# Patient Record
Sex: Female | Born: 1995 | Hispanic: Yes | Marital: Married | Smoking: Never smoker
Health system: Southern US, Community
[De-identification: ages and names within clinical notes are randomized; demographics above are authoritative.]

## PROBLEM LIST (undated history)

## (undated) ENCOUNTER — Inpatient Hospital Stay (HOSPITAL_COMMUNITY): Payer: Self-pay

## (undated) DIAGNOSIS — Z8759 Personal history of other complications of pregnancy, childbirth and the puerperium: Secondary | ICD-10-CM

## (undated) DIAGNOSIS — G43009 Migraine without aura, not intractable, without status migrainosus: Secondary | ICD-10-CM

## (undated) DIAGNOSIS — F419 Anxiety disorder, unspecified: Secondary | ICD-10-CM

## (undated) DIAGNOSIS — J4520 Mild intermittent asthma, uncomplicated: Secondary | ICD-10-CM

## (undated) DIAGNOSIS — K219 Gastro-esophageal reflux disease without esophagitis: Secondary | ICD-10-CM

## (undated) DIAGNOSIS — Z789 Other specified health status: Secondary | ICD-10-CM

## (undated) DIAGNOSIS — F32A Depression, unspecified: Secondary | ICD-10-CM

## (undated) DIAGNOSIS — E1165 Type 2 diabetes mellitus with hyperglycemia: Secondary | ICD-10-CM

## (undated) DIAGNOSIS — R112 Nausea with vomiting, unspecified: Secondary | ICD-10-CM

## (undated) DIAGNOSIS — E782 Mixed hyperlipidemia: Secondary | ICD-10-CM

## (undated) DIAGNOSIS — D649 Anemia, unspecified: Secondary | ICD-10-CM

## (undated) DIAGNOSIS — Z8632 Personal history of gestational diabetes: Secondary | ICD-10-CM

## (undated) DIAGNOSIS — O24419 Gestational diabetes mellitus in pregnancy, unspecified control: Secondary | ICD-10-CM

## (undated) DIAGNOSIS — J45909 Unspecified asthma, uncomplicated: Secondary | ICD-10-CM

## (undated) DIAGNOSIS — Z862 Personal history of diseases of the blood and blood-forming organs and certain disorders involving the immune mechanism: Secondary | ICD-10-CM

## (undated) DIAGNOSIS — E119 Type 2 diabetes mellitus without complications: Secondary | ICD-10-CM

## (undated) DIAGNOSIS — Z973 Presence of spectacles and contact lenses: Secondary | ICD-10-CM

## (undated) DIAGNOSIS — Z794 Long term (current) use of insulin: Secondary | ICD-10-CM

## (undated) DIAGNOSIS — Z9889 Other specified postprocedural states: Secondary | ICD-10-CM

## (undated) DIAGNOSIS — E559 Vitamin D deficiency, unspecified: Secondary | ICD-10-CM

## (undated) DIAGNOSIS — O1490 Unspecified pre-eclampsia, unspecified trimester: Secondary | ICD-10-CM

## (undated) DIAGNOSIS — Z5189 Encounter for other specified aftercare: Secondary | ICD-10-CM

## (undated) HISTORY — PX: NO PAST SURGERIES: SHX2092

## (undated) HISTORY — PX: WISDOM TOOTH EXTRACTION: SHX21

## (undated) HISTORY — DX: Unspecified pre-eclampsia, unspecified trimester: O14.90

## (undated) HISTORY — DX: Encounter for other specified aftercare: Z51.89

## (undated) HISTORY — DX: Migraine without aura, not intractable, without status migrainosus: G43.009

## (undated) HISTORY — DX: Depression, unspecified: F32.A

## (undated) HISTORY — DX: Unspecified asthma, uncomplicated: J45.909

## (undated) HISTORY — DX: Anxiety disorder, unspecified: F41.9

---
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
## 2016-07-07 ENCOUNTER — Other Ambulatory Visit: Payer: Self-pay | Admitting: Obstetrics & Gynecology

## 2016-07-07 DIAGNOSIS — O3680X0 Pregnancy with inconclusive fetal viability, not applicable or unspecified: Secondary | ICD-10-CM

## 2016-07-09 ENCOUNTER — Other Ambulatory Visit: Payer: Self-pay

## 2016-07-15 ENCOUNTER — Ambulatory Visit (INDEPENDENT_AMBULATORY_CARE_PROVIDER_SITE_OTHER): Payer: Medicaid Other

## 2016-07-15 DIAGNOSIS — O4691 Antepartum hemorrhage, unspecified, first trimester: Secondary | ICD-10-CM

## 2016-07-15 DIAGNOSIS — Z3A09 9 weeks gestation of pregnancy: Secondary | ICD-10-CM

## 2016-07-15 DIAGNOSIS — O3680X0 Pregnancy with inconclusive fetal viability, not applicable or unspecified: Secondary | ICD-10-CM

## 2016-07-15 NOTE — Progress Notes (Addendum)
US 8+2 wks,single IUP w/ys,pos fht 171 bpm,normal ov's bilat,crl 18.7 mm,subchorionic hemorrhage 2.5 x .7 x 1.6 cm

## 2016-07-21 ENCOUNTER — Ambulatory Visit (INDEPENDENT_AMBULATORY_CARE_PROVIDER_SITE_OTHER): Payer: Medicaid Other | Admitting: Women's Health

## 2016-07-21 ENCOUNTER — Encounter: Payer: Self-pay | Admitting: Women's Health

## 2016-07-21 VITALS — BP 120/58 | HR 76 | Wt 224.0 lb

## 2016-07-21 DIAGNOSIS — O23591 Infection of other part of genital tract in pregnancy, first trimester: Secondary | ICD-10-CM | POA: Diagnosis not present

## 2016-07-21 DIAGNOSIS — N898 Other specified noninflammatory disorders of vagina: Secondary | ICD-10-CM

## 2016-07-21 DIAGNOSIS — Z331 Pregnant state, incidental: Secondary | ICD-10-CM | POA: Diagnosis not present

## 2016-07-21 DIAGNOSIS — O26891 Other specified pregnancy related conditions, first trimester: Secondary | ICD-10-CM | POA: Diagnosis not present

## 2016-07-21 DIAGNOSIS — R300 Dysuria: Secondary | ICD-10-CM | POA: Diagnosis not present

## 2016-07-21 DIAGNOSIS — Z3A09 9 weeks gestation of pregnancy: Secondary | ICD-10-CM | POA: Diagnosis not present

## 2016-07-21 LAB — POCT URINALYSIS DIPSTICK
Glucose, UA: NEGATIVE
KETONES UA: NEGATIVE
Leukocytes, UA: NEGATIVE
Nitrite, UA: NEGATIVE
PROTEIN UA: NEGATIVE
RBC UA: NEGATIVE

## 2016-07-21 LAB — POCT WET PREP (WET MOUNT)
Clue Cells Wet Prep Whiff POC: NEGATIVE
TRICHOMONAS WET PREP HPF POC: ABSENT

## 2016-07-21 NOTE — Progress Notes (Signed)
   [facility] Visit  Patient name: Claire Harrison MRN [id number]  Date of birth: 03/29/1996  CC & HPI:  Claire Harrison is a 20 y.o. G1P0 @ Hispanic female at 429wks presenting today for report of burning after she urinates, urinary frequency, water d/c x 2wks. New OB scheduled for 11/1 Patient's last menstrual period was 05/18/2016 (approximate).   Pertinent History Reviewed:  Medical & Surgical Hx:   Past medical, surgical, family, and social history reviewed in electronic medical record Medications: Reviewed & Updated - see associated section Allergies: Reviewed in electronic medical record  Objective Findings:  Vitals: BP (!) 120/58 (BP Location: Right Arm, Patient Position: Sitting, Cuff Size: Large)   Pulse 76   Wt 224 lb (101.6 kg)   LMP 05/18/2016 (Approximate)  There is no height or weight on file to calculate BMI.  Physical Examination: General appearance - alert, well appearing, and in no distress Pelvic - cx clear, scant amt normal appearing/smelling d/c  Results for orders placed or performed in visit on 07/21/16 (from the past 24 hour(s))  POCT urinalysis dipstick   Collection Time: 07/21/16  4:16 PM  Result Value Ref Range   Color, UA yellow    Clarity, UA clear    Glucose, UA neg    Bilirubin, UA     Ketones, UA neg    Spec Grav, UA     Blood, UA neg    pH, UA     Protein, UA neg    Urobilinogen, UA     Nitrite, UA neg    Leukocytes, UA Negative Negative  POCT Wet Prep [name]([place]et [place])   Collection Time: 07/21/16  4:34 PM  Result Value Ref Range   Source Wet Prep POC vaginal    WBC, Wet Prep HPF POC few    Bacteria Wet Prep HPF POC None None, Few, Too numerous to count   BACTERIA WET PREP MORPHOLOGY POC     Clue Cells Wet Prep HPF POC None None, Too numerous to count   Clue Cells Wet Prep Whiff POC Negative Whiff    Yeast Wet Prep HPF POC None    KOH Wet Prep POC     Trichomonas Wet Prep HPF POC Absent Absent     Assessment & Plan:  A:   20 [redacted]w[redacted]d  pregnant  Burning after urination  Urinary frequency  Urine dipstick completely neg  P:  Send urine cx, gc/ct  Return for As scheduled. 11/1 for new ob  Marge DuncansBooker, Aubra Pappalardo Randall CNM, [facility] 07/21/2016 4:35 PM

## 2016-07-23 LAB — GC/CHLAMYDIA PROBE AMP
CHLAMYDIA, DNA PROBE: NEGATIVE
NEISSERIA GONORRHOEAE BY PCR: NEGATIVE

## 2016-07-23 LAB — URINE CULTURE

## 2016-07-24 ENCOUNTER — Telehealth: Payer: Self-pay | Admitting: Women's Health

## 2016-07-24 DIAGNOSIS — O2341 Unspecified infection of urinary tract in pregnancy, first trimester: Secondary | ICD-10-CM | POA: Insufficient documentation

## 2016-07-24 MED ORDER — CEPHALEXIN 500 MG PO CAPS: 500.0000 mg | ORAL_CAPSULE | Freq: Four times a day (QID) | ORAL | 0 refills | Status: DC

## 2016-07-24 NOTE — Telephone Encounter (Signed)
Notified pt of +urine cx, rx keflex qid x 7d Cheral MarkerKimberly R. Yaziel Brandon, CNM, [facility] 07/24/2016 2:18 PM

## 2016-07-25 NOTE — Telephone Encounter (Signed)
See previous note for telephone encounter.  Cheral MarkerKimberly R. Booker, CNM, [facility] 07/25/2016 8:17 AM

## 2016-07-30 ENCOUNTER — Other Ambulatory Visit: Payer: Self-pay | Admitting: Adult Health

## 2016-07-30 ENCOUNTER — Telehealth: Payer: Self-pay | Admitting: Adult Health

## 2016-07-30 ENCOUNTER — Ambulatory Visit (INDEPENDENT_AMBULATORY_CARE_PROVIDER_SITE_OTHER): Payer: Medicaid Other | Admitting: Adult Health

## 2016-07-30 ENCOUNTER — Encounter: Payer: Self-pay | Admitting: Adult Health

## 2016-07-30 ENCOUNTER — Other Ambulatory Visit (INDEPENDENT_AMBULATORY_CARE_PROVIDER_SITE_OTHER): Payer: Medicaid Other

## 2016-07-30 VITALS — BP 120/60 | HR 76 | Ht 62.0 in | Wt 223.0 lb

## 2016-07-30 DIAGNOSIS — F329 Major depressive disorder, single episode, unspecified: Secondary | ICD-10-CM

## 2016-07-30 DIAGNOSIS — O039 Complete or unspecified spontaneous abortion without complication: Secondary | ICD-10-CM

## 2016-07-30 DIAGNOSIS — O3680X0 Pregnancy with inconclusive fetal viability, not applicable or unspecified: Secondary | ICD-10-CM | POA: Diagnosis not present

## 2016-07-30 DIAGNOSIS — Z0283 Encounter for blood-alcohol and blood-drug test: Secondary | ICD-10-CM

## 2016-07-30 DIAGNOSIS — Z1389 Encounter for screening for other disorder: Secondary | ICD-10-CM | POA: Diagnosis not present

## 2016-07-30 DIAGNOSIS — Z331 Pregnant state, incidental: Secondary | ICD-10-CM

## 2016-07-30 DIAGNOSIS — Z3A09 9 weeks gestation of pregnancy: Secondary | ICD-10-CM | POA: Diagnosis not present

## 2016-07-30 DIAGNOSIS — Z3481 Encounter for supervision of other normal pregnancy, first trimester: Secondary | ICD-10-CM

## 2016-07-30 DIAGNOSIS — F32A Depression, unspecified: Secondary | ICD-10-CM

## 2016-07-30 LAB — POCT URINALYSIS DIPSTICK
Glucose, UA: NEGATIVE
KETONES UA: NEGATIVE
Leukocytes, UA: NEGATIVE
Nitrite, UA: NEGATIVE
PROTEIN UA: NEGATIVE

## 2016-07-30 NOTE — Telephone Encounter (Addendum)
Pt states she saw Cyril MourningJennifer Griffin, NP today for a MAB, after discussing with her family and husband has decided to proceed with D&C. Call transferred to [facility] Griffin,NP and appt scheduled with Dr. Emelda FearFerguson tomorrow to discuss procedure.

## 2016-07-30 NOTE — Addendum Note (Signed)
Addended by: Colen DarlingYOUNG, Jordanny Waddington S on: 07/30/2016 01:51 PM   Modules accepted: Orders

## 2016-07-30 NOTE — Patient Instructions (Signed)
Follow  Up in 1 week Aborto espontneo  (Miscarriage) El aborto espontneo es la prdida de un beb que no ha nacido (feto) antes de la semana 20 del Psychiatristembarazo. La mayor parte de estos abortos ocurre en los primeros 3 meses. En algunos casos ocurre antes de que la mujer sepa que est [place]. Tambin se denomina "aborto espontneo" o "prdida prematura del embarazo". El aborto espontneo puede ser Neomia Dearuna experiencia que afecte emocionalmente a Dealerla persona. Converse con su mdico si tiene dudas, cmo es el proceso de Ferndaleduelo, y sobre planes futuros de Psychiatristembarazo.  CAUSAS   Algunos problemas cromosmicos pueden hacer imposible que el beb se desarrolle normalmente. Los problemas con los genes o cromosomas del beb son generalmente el resultado de errores que se producen, por casualidad, cuando el embrin se divide y crece. Estos problemas no se heredan de los Midlandpadres.  Infeccin en el cuello del tero.   Problemas hormonales.   Problemas en el cuello del tero, como tener un tero incompetente. Esto ocurre cuando los tejidos no son lo suficientemente fuertes como para Arts administratorcontener el embarazo.   Problemas del tero, como un tero con forma anormal, los fibromas o anormalidades congnitas.   Ciertas enfermedades crnicas.   No fume, no beba alcohol, ni consuma drogas.   Traumatismos  A veces, la causa es desconocida.  SNTOMAS   Sangrado o manchado vaginal, con o sin clicos o dolor.  Dolor o clicos en el abdomen o en la cintura.  Eliminacin de lquido, tejidos o cogulos grandes por la vagina. DIAGNSTICO  El Office Depotmdico le har un examen fsico. Tambin le indicar una ecografa para confirmar el aborto. Es posible que se realicen anlisis de [place].  TRATAMIENTO   En algunos casos el tratamiento no es necesario, si se eliminan naturalmente todos los tejidos embrionarios que se encontraban en el tero. Si el feto o la placenta quedan dentro del tero (aborto incompleto), pueden infectarse,  los tejidos que quedan pueden infectarse y deben retirarse. Generalmente se realiza un procedimiento de dilatacin y curetaje (D y C). Durante el procedimiento de dilatacin y curetaje, el cuello del tero se abre (dilata) y se retira cualquier resto de tejido fetal o placentario del tero.  Si hay una infeccin, le recetarn antibiticos. Podrn recetarle otros medicamentos para reducir el tamao del tero (contraerlo) si hay una mucho sangrado.  Si su sangre es Rh negativa y su beb es Rh positivo, usted necesitar la inyeccin de inmunoglobulina Rh. Esta inyeccin proteger a los futuros bebs de tener problemas de compatibilidad Rh en futuros embarazos. INSTRUCCIONES PARA EL CUIDADO EN EL HOGAR   El mdico le indicar reposo en cama o le permitir Dance movement psychotherapistrealizar actividades livianas. Vuelva a la actividad lentamente o segn las indicaciones de su mdico.  Pdale a alguien que la ayude con las responsabilidades familiares y del hogar durante este tiempo.   Lleve un registro de la cantidad y la saturacin de las toallas higinicas que Landscape architectutiliza cada da. Anote esta informacin   No use tampones. No No se haga duchas vaginales ni tenga relaciones sexuales hasta que el mdico la autorice.   Slo tome medicamentos de venta libre o recetados para Primary school teachercalmar el dolor o Environmental health practitionerel malestar, segn las indicaciones de su mdico.   No tome aspirina. La aspirina puede ocasionar hemorragias.   Concurra puntualmente a las citas de control con el mdico.   Si usted o su pareja tienen dificultades con el duelo, hable con su mdico para buscar la ayuda psicolgica que  los ayude a Programmer, applicationsenfrentar la prdida del Psychiatristembarazo. Permtase el tiempo suficiente de duelo antes de quedar embarazada nuevamente.  SOLICITE ATENCIN MDICA DE INMEDIATO SI:   Siente calambres intensos o dolor en la espalda o en el abdomen.  Tiene fiebre.  Elimina grandes cogulos de [place]angre (del tamao de una nuez o ms) o tejidos por la vagina. Guarde lo  que ha eliminado para que su mdico lo examine.   La hemorragia aumenta.   Brett Fairybserva una secrecin vaginal espesa y con mal olor.  Se siente mareada, dbil, o se desmaya.   Siente escalofros.  ASEGRESE DE QUE:   Comprende estas instrucciones.  Controlar su enfermedad.  Solicitar ayuda de inmediato si no mejora o si empeora.   Esta informacin no tiene Theme park managercomo fin reemplazar el consejo del mdico. Asegrese de hacerle al mdico cualquier pregunta que tenga.   Document Released: 06/25/2005 Document Revised: 01/10/2013 Elsevier Interactive Patient Education [employer]2016 Elsevier Inc.

## 2016-07-30 NOTE — Progress Notes (Signed)
US TV 8+3 wk fetal pole with NO FHT,crl 20.1 mm,normal ov's bilat,pt was seen by Victorino DikeJennifer

## 2016-07-30 NOTE — Progress Notes (Addendum)
Subjective:     Patient ID: Claire Harrison, female   DOB: 06/24/1996, 20 y.o.   MRN: [id number]  HPI Claire Harrison is a 20 year old Hispanic female in for new OB, about 10 weeks, and found to have no FHM.She started spotting last night while trick or treating [facility] QHCG was 8573.0, she did not have US.  Review of Systems +spotting Reviewed past medical,surgical, social and family history. Reviewed medications and allergies.     Objective:   Physical Exam BP 120/60   Pulse 76   Wt 223 lb (101.2 kg)   LMP 05/18/2016 (Approximate)  Skin warm and dry. Neck: mid line trachea, normal thyroid, good ROM, no lymphadenopathy noted. Lungs: clear to ausculation bilaterally. Cardiovascular: regular rate and rhythm.Abdomen is soft and non tender, US showed no FHM and baby stopped growing at about 8+3 weeks.Discussed cytotec, D&C and waiting and watching, she is going to go home and discuss with family, then let me know. PHQ 9 score was 12, she is a little depressed, but she declines meds at present, she is teary  now.She says blood type B+.   Face time over 30 minutes counseling and coordinating care.  Assessment:     Miscarriage    Depression   Plan:     Go home and talk with husband and let me know in am what you want to do Follow up in 1 week Review handout on miscarriage

## 2016-07-31 ENCOUNTER — Encounter (HOSPITAL_COMMUNITY)
Admission: RE | Admit: 2016-07-31 | Discharge: 2016-07-31 | Disposition: A | Discharge status: Home / Self Care | Payer: Medicaid Other | Source: Ambulatory Visit | Attending: Obstetrics and Gynecology | Admitting: Obstetrics and Gynecology

## 2016-07-31 ENCOUNTER — Other Ambulatory Visit: Payer: Self-pay | Admitting: Obstetrics and Gynecology

## 2016-07-31 ENCOUNTER — Encounter (HOSPITAL_COMMUNITY): Payer: Self-pay

## 2016-07-31 ENCOUNTER — Encounter: Payer: Self-pay | Admitting: Obstetrics and Gynecology

## 2016-07-31 ENCOUNTER — Ambulatory Visit (INDEPENDENT_AMBULATORY_CARE_PROVIDER_SITE_OTHER): Payer: Medicaid Other | Admitting: Obstetrics and Gynecology

## 2016-07-31 VITALS — BP 118/72 | HR 74 | Ht 62.0 in | Wt 223.0 lb

## 2016-07-31 DIAGNOSIS — Z01818 Encounter for other preprocedural examination: Secondary | ICD-10-CM

## 2016-07-31 DIAGNOSIS — Z01812 Encounter for preprocedural laboratory examination: Secondary | ICD-10-CM | POA: Insufficient documentation

## 2016-07-31 DIAGNOSIS — Z0183 Encounter for blood typing: Secondary | ICD-10-CM | POA: Insufficient documentation

## 2016-07-31 DIAGNOSIS — O021 Missed abortion: Secondary | ICD-10-CM | POA: Diagnosis not present

## 2016-07-31 HISTORY — DX: Other specified health status: Z78.9

## 2016-07-31 LAB — CBC
HCT: 37.7 % (ref 36.0–46.0)
Hemoglobin: 12.8 g/dL (ref 12.0–15.0)
MCH: 27.8 pg (ref 26.0–34.0)
MCHC: 34 g/dL (ref 30.0–36.0)
MCV: 82 fL (ref 78.0–100.0)
PLATELETS: 360 10*3/uL (ref 150–400)
RBC: 4.6 MIL/uL (ref 3.87–5.11)
RDW: 13 % (ref 11.5–15.5)
WBC: 11.3 10*3/uL — ABNORMAL HIGH (ref 4.0–10.5)

## 2016-07-31 LAB — TYPE AND SCREEN
ABO/RH(D): B POS
Antibody Screen: NEGATIVE

## 2016-07-31 LAB — MICROSCOPIC EXAMINATION: Casts: NONE SEEN /lpf

## 2016-07-31 LAB — URINALYSIS, ROUTINE W REFLEX MICROSCOPIC
Bilirubin, UA: NEGATIVE
GLUCOSE, UA: NEGATIVE
Ketones, UA: NEGATIVE
Leukocytes, UA: NEGATIVE
Nitrite, UA: NEGATIVE
PROTEIN UA: NEGATIVE
Specific Gravity, UA: 1.013 (ref 1.005–1.030)
Urobilinogen, Ur: 0.2 mg/dL (ref 0.2–1.0)
pH, UA: 6 (ref 5.0–7.5)

## 2016-07-31 LAB — GC/CHLAMYDIA PROBE AMP
CHLAMYDIA, DNA PROBE: NEGATIVE
Neisseria gonorrhoeae by PCR: NEGATIVE

## 2016-07-31 NOTE — Addendum Note (Signed)
Addended by: Tilda BurrowFERGUSON, Victory Strollo V. on: 07/31/2016 04:38 PM   Modules accepted: Orders

## 2016-07-31 NOTE — Progress Notes (Signed)
Patient ID: Claire Harrison, female   DOB: 09/13/1996, 20 y.o.   MRN: [id number]  Preoperative History and Physical  Claire Harrison is a 20 y.o. G1P0 here for surgical management of miscarriage. Pt notes that she was evaluated at [facility] 2 nights ago and informed that there was no fetal heart movement. Pt reports that she followed up in office on yesterday with no decision made as to her next steps. Pt states that this was her first pregnancy. Pt reports that she is having associated symptoms of resolved vomiting x yesterday, nausea x yesterday, and vaginal spotting. Pt hasn't tried any medications for the relief of her symptoms. Pt denies any other symptoms.   No significant preoperative concerns.  Proposed surgery: D&C  History reviewed. No pertinent past medical history. History reviewed. No pertinent surgical history. OB History  Gravida Para Term Preterm AB Living  1            SAB TAB Ectopic Multiple Live Births               # Outcome Date GA Lbr Len/2nd Weight Sex Delivery Anes PTL Lv  1 Current             Patient denies any other pertinent gynecologic issues.   Current Outpatient Prescriptions on File Prior to Visit  Medication Sig Dispense Refill  . cephALEXin (KEFLEX) 500 MG capsule Take 1 capsule (500 mg total) by mouth 4 (four) times daily. X 7 days 28 capsule 0  . Prenatal Vit-Fe Fumarate-FA (PRENATAL VITAMIN PO) Take by mouth daily.      No current facility-administered medications on file prior to visit.    Allergies  Allergen Reactions  . Latex Rash    Social History:   reports that she has never smoked. She has never used smokeless tobacco. She reports that she does not drink alcohol or use drugs.  Family History  Problem Relation Age of Onset  . Diabetes Mother   . Hypertension Mother   . Kidney failure Mother   . Diabetes Sister   . Diabetes Sister   . Diabetes Sister     Review of Systems: Noncontributory  PHYSICAL EXAM: Blood pressure  118/72, pulse 74, height 5\' 2"  (1.575 m), weight 223 lb (101.2 kg), last menstrual period 05/18/2016. General appearance - alert, well appearing, and in no distress Chest - clear to auscultation, no wheezes, rales or rhonchi, symmetric air entry Heart - normal rate and regular rhythm Abdomen - soft, nontender, nondistended, no masses or organomegaly Pelvic - normal external genitalia, vulva, vagina, cervix, uterus and adnexa,  VULVA: normal appearing vulva with no masses, tenderness or lesions,  VAGINA: normal appearing vagina with normal color and discharge, no lesions, DNA probe for chlamydia and GC obtained,  CERVIX: normal appearing cervix without discharge or lesions,  UTERUS: uterus is normal size, shape, consistency and nontender, mid-position ADNEXA: normal adnexa in size, nontender and no masses Extremities - peripheral pulses normal, no pedal edema, no clubbing or cyanosis  Labs: Results for orders placed or performed in visit on 07/30/16 (from the past 336 hour(s))  POCT Urinalysis Dipstick   Collection Time: 07/30/16 11:00 AM  Result Value Ref Range   Color, UA     Clarity, UA     Glucose, UA neg    Bilirubin, UA     Ketones, UA neg    Spec Grav, UA     Blood, UA trace    pH, UA     Protein,  UA neg    Urobilinogen, UA     Nitrite, UA neg    Leukocytes, UA Negative Negative  Microscopic Examination   Collection Time: 07/30/16  1:00 PM  Result Value Ref Range   WBC, UA 0-5 0 - 5 /hpf   RBC, UA 0-2 0 - 2 /hpf   Epithelial Cells (non renal) 0-10 0 - 10 /hpf   Casts None seen None seen /lpf   Mucus, UA Present Not Estab.   Bacteria, UA Few None seen/Few  Urinalysis, Routine w reflex microscopic ([facility])   Collection Time: 07/30/16  1:00 PM  Result Value Ref Range   Specific Gravity, UA 1.013 1.005 - 1.030   pH, UA 6.0 5.0 - 7.5   Color, UA Yellow Yellow   Appearance Ur Clear Clear   Leukocytes, UA Negative Negative   Protein, UA Negative Negative/Trace    Glucose, UA Negative Negative   Ketones, UA Negative Negative   RBC, UA Trace (A) Negative   Bilirubin, UA Negative Negative   Urobilinogen, Ur 0.2 0.2 - 1.0 mg/dL   Nitrite, UA Negative Negative   Microscopic Examination See below:   Results for orders placed or performed in visit on 07/21/16 (from the past 336 hour(s))  POCT urinalysis dipstick   Collection Time: 07/21/16  4:16 PM  Result Value Ref Range   Color, UA yellow    Clarity, UA clear    Glucose, UA neg    Bilirubin, UA     Ketones, UA neg    Spec Grav, UA     Blood, UA neg    pH, UA     Protein, UA neg    Urobilinogen, UA     Nitrite, UA neg    Leukocytes, UA Negative Negative  POCT Wet Prep Claire Harrison(Wet [place])   Collection Time: 07/21/16  4:34 PM  Result Value Ref Range   Source Wet Prep POC vaginal    WBC, Wet Prep HPF POC few    Bacteria Wet Prep HPF POC None None, Few, Too numerous to count   BACTERIA WET PREP MORPHOLOGY POC     Clue Cells Wet Prep HPF POC None None, Too numerous to count   Clue Cells Wet Prep Whiff POC Negative Whiff    Yeast Wet Prep HPF POC None    KOH Wet Prep POC     Trichomonas Wet Prep HPF POC Absent Absent  GC/Chlamydia Probe Amp   Collection Time: 07/21/16  4:45 PM  Result Value Ref Range   Chlamydia trachomatis, NAA Negative Negative   Neisseria gonorrhoeae by PCR Negative Negative  Urine culture   Collection Time: 07/21/16  4:45 PM  Result Value Ref Range   Urine Culture, Routine Final report (A)    Urine Culture result 1 Escherichia coli (A)    ANTIMICROBIAL SUSCEPTIBILITY Comment     Imaging Studies: [place] Ob Comp Less 14 Wks  Result Date: 07/16/2016 DATING AND VIABILITY SONOGRAM Claire Harrison is a 20 y.o. year old G1P0 with LMP 05/18/2016 which would correlate to  2690w2d weeks gestation.  She has irregular menstrual cycles.   She is here today for a confirmatory initial sonogram. GESTATION: SINGLETON   FETAL ACTIVITY:          Heart rate         171        CERVIX: Appears closed  ADNEXA: The ovaries are normal. GESTATIONAL AGE AND  BIOMETRICS: Gestational criteria: Estimated Date of Delivery: 02/22/17 by LMP  now at 5125w2d Previous Scans:0    CROWN RUMP LENGTH           18.7 mm         8+2 weeks                                                                      AVERAGE EGA(BY THIS SCAN):  8+2 weeks WORKING EDD( LMP ):  02/22/2017  TECHNICIAN COMMENTS: US 8+2 wks,single IUP w/ys,pos fht 171 bpm,normal ov's bilat,crl 18.7 mm,subchorionic hemorrhage 2.5 x .7 x 1.6 cm A copy of this report including all images has been saved and backed up to a second source for retrieval if needed. All measures and details of the anatomical scan, placentation, fluid volume and pelvic anatomy are contained in that report. Amber Flora LippsJ Carl 07/15/2016 10:54 AM Clinical Impression and recommendations: I have reviewed the sonogram results above, combined with the patient's current clinical course, below are my impressions and any appropriate recommendations for management based on the sonographic findings. Viable early IUP G1P0 Estimated Date of Delivery: 02/22/17 LMP, today's sonogram Normal general sonographic findings Recommend routine care unless otherwise clinically indicated EURE,LUTHER H 07/16/2016 6:54 PM    Assessment: Missed AB  Patient Active Problem List   Diagnosis Date Noted  . UTI (urinary tract infection) during pregnancy, first trimester 07/24/2016  . Pregnant 07/21/2016     Plan: Patient will undergo surgical management with lab work completed today and D&C scheduled for 9:45 AM tomorrow.    Tilda BurrowJohn V Amiracle Neises, MD 07/31/2016 2:01 PM  By signing my name below, I, Soijett Blue, attest that this documentation has been prepared under the direction and in the presence of Tilda BurrowJohn V Othelia Riederer, MD. Electronically Signed: Soijett Blue, ED Scribe. 07/31/16. 2:01 PM.  I personally performed the services described in this documentation, which was SCRIBED in my presence. The recorded information has been  reviewed and considered accurate. It has been edited as necessary during review. Tilda BurrowFERGUSON,Shant Hence V, MD

## 2016-07-31 NOTE — Patient Instructions (Addendum)
Your procedure is scheduled on: 08/01/2016  [facility] at  [facility]:15   AM.  Call this number if you have problems the morning of surgery: [phone]   Remember:   Do not drink or eat food:After Midnight.  :     Do not wear jewelry, make-up or nail polish.  Do not wear lotions, powders, or perfumes. You may wear deodorant.  Do not shave 48 hours prior to surgery. Men may shave face and neck.  Do not bring valuables to the hospital.  Contacts, dentures or bridgework may not be worn into surgery.  Leave suitcase in the car. After surgery it may be brought to your room.  For patients admitted to the hospital, checkout time is 11:00 AM the day of discharge.   Patients discharged the day of surgery will not be allowed to drive home.    Special Instructions: Shower using CHG night before surgery and shower the day of surgery use CHG.  Use special wash - you have one bottle of CHG for all showers.  You should use approximately 1/2 of the bottle for each shower. Dilation and Curettage or Vacuum Curettage Dilation and curettage (D&C) and vacuum curettage are minor procedures. A D&C involves stretching (dilation) the cervix and scraping (curettage) the inside lining of the womb (uterus). During a D&C, tissue is gently scraped from the inside lining of the uterus. During a vacuum curettage, the lining and tissue in the uterus are removed with the use of gentle suction.  Curettage may be performed to either diagnose or treat a problem. As a diagnostic procedure, curettage is performed to examine tissues from the uterus. A diagnostic curettage may be performed for the following symptoms:   Irregular bleeding in the uterus.   Bleeding with the development of clots.   Spotting between menstrual periods.   Prolonged menstrual periods.   Bleeding after menopause.   No menstrual period (amenorrhea).   A change in size and shape of the uterus.  As a treatment procedure, curettage  may be performed for the following reasons:   Removal of an IUD (intrauterine device).   Removal of retained placenta after giving birth. Retained placenta can cause an infection or bleeding severe enough to require transfusions.   Abortion.   Miscarriage.   Removal of polyps inside the uterus.   Removal of uncommon types of noncancerous lumps (fibroids).  [facility] PROVID[facility] KNOW ABOUT:   Any allergies you have.   All medicines you are taking, including vitamins, herbs, eye drops, creams, and over-the-counter medicines.   Previous problems you or members of your family have had with the use of anesthetics.   Any blood disorders you have.   Previous surgeries you have had.   Medical conditions you have. RISKS AND COMPLICATIONS  Generally, this is a safe procedure. However, as with any procedure, complications can occur. Possible complications include:  Excessive bleeding.   Infection of the uterus.   Damage to the cervix.   Development of scar tissue (adhesions) inside the uterus, later causing abnormal amounts of menstrual bleeding.   Complications from the general anesthetic, if a general anesthetic is used.   Putting a hole (perforation) in the uterus. This is rare.  BEFORE THE PROCEDURE   Eat and drink before the procedure only as directed by your health care provider.   Arrange for someone to take you home.  PROCEDURE  This procedure usually takes about 15-30 minutes.  You will be given  one of the following:  A medicine that numbs the area in and around the cervix (local anesthetic).   A medicine to make you sleep through the procedure (general anesthetic).  You will lie on your back with your legs in stirrups.   A warm metal or plastic instrument (speculum) will be placed in your vagina to keep it open and to allow the health care provider to see the cervix.  There are two ways in which your cervix can be softened and  dilated. These include:   Taking a medicine.   Having thin rods (laminaria) inserted into your cervix.   A curved tool (curette) will be used to scrape cells from the inside lining of the uterus. In some cases, gentle suction is applied with the curette. The curette will then be removed.  AFTER THE PROCEDURE   You will rest in the recovery area until you are stable and are ready to go home.   You may feel sick to your stomach (nauseous) or throw up (vomit) if you were given a general anesthetic.   You may have a sore throat if a tube was placed in your throat during general anesthesia.   You may have light cramping and bleeding. This may last for 2 days to 2 weeks after the procedure.   Your uterus needs to make a new lining after the procedure. This may make your next period late.   This information is not intended to replace advice given to you by your health care provider. Make sure you discuss any questions you have with your health care provider.   Document Released: 09/15/2005 Document Revised: 05/18/2013 Document Reviewed: 04/14/2013 Elsevier Interactive Patient Education 2016 Elsevier Inc. General Anesthesia, Adult, Care After Refer to this sheet in the next few weeks. These instructions provide you with information on caring for yourself after your procedure. Your health care provider may also give you more specific instructions. Your treatment has been planned according to current medical practices, but problems sometimes occur. Call your health care provider if you have any problems or questions after your procedure. WHAT TO EXPECT AFTER THE PROCEDURE After the procedure, it is typical to experience:  Sleepiness.  Nausea and vomiting. HOME CARE INSTRUCTIONS  For the first 24 hours after general anesthesia:  Have a responsible person with you.  Do not drive a car. If you are alone, do not take public transportation.  Do not drink alcohol.  Do not take  medicine that has not been prescribed by your health care provider.  Do not sign important papers or make important decisions.  You may resume a normal diet and activities as directed by your health care provider.  Change bandages (dressings) as directed.  If you have questions or problems that seem related to general anesthesia, call the hospital and ask for the anesthetist or anesthesiologist on call. SEEK MEDICAL CARE IF:  You have nausea and vomiting that continue the day after anesthesia.  You develop a rash. SEEK IMMEDIATE MEDICAL CARE IF:   You have difficulty breathing.  You have chest pain.  You have any allergic problems.   This information is not intended to replace advice given to you by your health care provider. Make sure you discuss any questions you have with your health care provider.   Document Released: 12/22/2000 Document Revised: 10/06/2014 Document Reviewed: 01/14/2012 Elsevier Interactive Patient Education [employer]2016 Elsevier Inc.

## 2016-07-31 NOTE — Patient Instructions (Signed)
Incomplete Miscarriage A miscarriage is the sudden loss of an unborn baby (fetus) before the 20th week of pregnancy. In an incomplete miscarriage, parts of the fetus or placenta (afterbirth) remain in the body.  Having a miscarriage can be an emotional experience. Talk with your health care provider about any questions you may have about miscarrying, the grieving process, and your future pregnancy plans. CAUSES   Problems with the fetal chromosomes that make it impossible for the baby to develop normally. Problems with the baby's genes or chromosomes are most often the result of errors that occur by chance as the embryo divides and grows. The problems are not inherited from the parents.  Infection of the cervix or uterus.  Hormone problems.  Problems with the cervix, such as having an incompetent cervix. This is when the tissue in the cervix is not strong enough to hold the pregnancy.  Problems with the uterus, such as an abnormally shaped uterus, uterine fibroids, or congenital abnormalities.  Certain medical conditions.  Smoking, drinking alcohol, or taking illegal drugs.  Trauma. SYMPTOMS   Vaginal bleeding or spotting, with or without cramps or pain.  Pain or cramping in the abdomen or lower back.  Passing fluid, tissue, or blood clots from the vagina. DIAGNOSIS  Your health care provider will perform a physical exam. You may also have an ultrasound to confirm the miscarriage. Blood or urine tests may also be ordered. TREATMENT   Usually, a dilation and curettage (D&C) procedure is performed. During a D&C procedure, the cervix is widened (dilated) and any remaining fetal or placental tissue is gently removed from the uterus.  Antibiotic medicines are prescribed if there is an infection. Other medicines may be given to reduce the size of the uterus (contract) if there is a lot of bleeding.  If you have Rh negative blood and your baby was Rh positive, you will need a Rho (D)  immune globulin shot. This shot will protect any future baby from having Rh blood problems in future pregnancies.  You may be confined to bed rest. This means you should stay in bed and only get up to use the bathroom. HOME CARE INSTRUCTIONS   Rest as directed by your health care provider.  Restrict activity as directed by your health care provider. You may be allowed to continue light activity if curettage was not done but you require further treatment.  Keep track of the number of pads you use each day. Keep track of how soaked (saturated) they are. Record this information.  Do not  use tampons.  Do not douche or have sexual intercourse until approved by your health care provider.  Keep all follow-up appointments for reevaluation and continuing management.  Only take over-the-counter or prescription medicines for pain, fever, or discomfort as directed by your health care provider.  Take antibiotic medicine as directed by your health care provider. Make sure you finish it even if you start to feel better. SEEK IMMEDIATE MEDICAL CARE IF:   You experience severe cramps in your stomach, back, or abdomen.  You have an unexplained temperature (make sure to record these temperatures).  You pass large clots or tissue (save these for your health care provider to inspect).  Your bleeding increases.  You become light-headed, weak, or have fainting episodes. MAKE SURE YOU:   Understand these instructions.  Will watch your condition.  Will get help right away if you are not doing well or get worse.   This information is not intended to   replace advice given to you by your health care provider. Make sure you discuss any questions you have with your health care provider.   Document Released: 09/15/2005 Document Revised: 10/06/2014 Document Reviewed: 04/14/2013 Elsevier Interactive Patient Education 2016 Elsevier Inc.  

## 2016-08-01 ENCOUNTER — Ambulatory Visit (HOSPITAL_COMMUNITY): Payer: Medicaid Other | Admitting: Anesthesiology

## 2016-08-01 ENCOUNTER — Encounter (HOSPITAL_COMMUNITY): Payer: Self-pay | Admitting: *Deleted

## 2016-08-01 ENCOUNTER — Encounter (HOSPITAL_COMMUNITY)
Admission: RE | Disposition: A | Discharge status: Home / Self Care | Payer: Self-pay | Source: Ambulatory Visit | Attending: Obstetrics and Gynecology

## 2016-08-01 ENCOUNTER — Ambulatory Visit (HOSPITAL_COMMUNITY)
Admission: RE | Admit: 2016-08-01 | Discharge: 2016-08-01 | Disposition: A | Discharge status: Home / Self Care | Payer: Medicaid Other | Source: Ambulatory Visit | Attending: Obstetrics and Gynecology | Admitting: Obstetrics and Gynecology

## 2016-08-01 DIAGNOSIS — O021 Missed abortion: Secondary | ICD-10-CM | POA: Diagnosis not present

## 2016-08-01 DIAGNOSIS — Z6841 Body Mass Index (BMI) 40.0 and over, adult: Secondary | ICD-10-CM | POA: Diagnosis not present

## 2016-08-01 HISTORY — PX: DILATION AND CURETTAGE OF UTERUS: SHX78

## 2016-08-01 LAB — URINE CULTURE

## 2016-08-01 SURGERY — DILATION AND CURETTAGE
Anesthesia: General | Site: Vagina

## 2016-08-01 MED ORDER — FENTANYL CITRATE (PF) 100 MCG/2ML IJ SOLN
INTRAMUSCULAR | Status: DC | PRN
  Administered 2016-08-01: 25 ug via INTRAVENOUS
  Administered 2016-08-01: 50 ug via INTRAVENOUS
  Administered 2016-08-01: 25 ug via INTRAVENOUS

## 2016-08-01 MED ORDER — 0.9 % SODIUM CHLORIDE (POUR BTL) OPTIME
TOPICAL | Status: DC | PRN
  Administered 2016-08-01: 1000 mL

## 2016-08-01 MED ORDER — GLYCOPYRROLATE 0.2 MG/ML IJ SOLN
INTRAMUSCULAR | Status: AC
Start: 2016-08-01 — End: 2016-08-01
  Filled 2016-08-01: qty 1

## 2016-08-01 MED ORDER — PROPOFOL 10 MG/ML IV BOLUS
INTRAVENOUS | Status: AC
  Filled 2016-08-01: qty 20

## 2016-08-01 MED ORDER — LIDOCAINE HCL (CARDIAC) 20 MG/ML IV SOLN
INTRAVENOUS | Status: DC | PRN
  Administered 2016-08-01: 50 mg via INTRAVENOUS

## 2016-08-01 MED ORDER — MIDAZOLAM HCL 2 MG/2ML IJ SOLN
INTRAMUSCULAR | Status: AC
  Filled 2016-08-01: qty 2

## 2016-08-01 MED ORDER — FENTANYL CITRATE (PF) 100 MCG/2ML IJ SOLN
INTRAMUSCULAR | Status: AC
  Filled 2016-08-01: qty 2

## 2016-08-01 MED ORDER — EPINEPHRINE PF 1 MG/ML IJ SOLN
INTRAMUSCULAR | Status: AC
  Filled 2016-08-01: qty 1

## 2016-08-01 MED ORDER — MIDAZOLAM HCL 2 MG/2ML IJ SOLN
1.0000 mg | INTRAMUSCULAR | Status: AC | PRN
  Administered 2016-08-01: 2 mg via INTRAVENOUS
  Administered 2016-08-01: 1 mg via INTRAVENOUS

## 2016-08-01 MED ORDER — LIDOCAINE HCL (PF) 1 % IJ SOLN
INTRAMUSCULAR | Status: AC
  Filled 2016-08-01: qty 5

## 2016-08-01 MED ORDER — MIDAZOLAM HCL 2 MG/2ML IJ SOLN
1.0000 mg | INTRAMUSCULAR | Status: DC | PRN
  Administered 2016-08-01 (×2): 1 mg via INTRAVENOUS
  Filled 2016-08-01 (×2): qty 2

## 2016-08-01 MED ORDER — PROPOFOL 10 MG/ML IV BOLUS
INTRAVENOUS | Status: DC | PRN
  Administered 2016-08-01: 25 mg via INTRAVENOUS
  Administered 2016-08-01: 150 mg via INTRAVENOUS
  Administered 2016-08-01: 50 mg via INTRAVENOUS

## 2016-08-01 MED ORDER — BUPIVACAINE HCL (PF) 0.5 % IJ SOLN
INTRAMUSCULAR | Status: AC
  Filled 2016-08-01: qty 30

## 2016-08-01 MED ORDER — BUPIVACAINE-EPINEPHRINE 0.5% -1:200000 IJ SOLN
INTRAMUSCULAR | Status: DC | PRN
  Administered 2016-08-01: 10 mL

## 2016-08-01 MED ORDER — MIDAZOLAM HCL 2 MG/2ML IJ SOLN: 2.0000 mg | Freq: Once | INTRAMUSCULAR | Status: DC

## 2016-08-01 MED ORDER — GLYCOPYRROLATE 0.2 MG/ML IJ SOLN
0.2000 mg | Freq: Once | INTRAMUSCULAR | Status: AC | PRN
  Administered 2016-08-01: 0.2 mg via INTRAVENOUS

## 2016-08-01 MED ORDER — SUCCINYLCHOLINE CHLORIDE 20 MG/ML IJ SOLN
INTRAMUSCULAR | Status: AC
  Filled 2016-08-01: qty 1

## 2016-08-01 MED ORDER — ONDANSETRON HCL 4 MG/2ML IJ SOLN
INTRAMUSCULAR | Status: AC
  Filled 2016-08-01: qty 2

## 2016-08-01 MED ORDER — GLYCOPYRROLATE 0.2 MG/ML IJ SOLN
INTRAMUSCULAR | Status: AC
  Filled 2016-08-01: qty 1

## 2016-08-01 MED ORDER — FENTANYL CITRATE (PF) 100 MCG/2ML IJ SOLN
25.0000 ug | INTRAMUSCULAR | Status: AC | PRN
  Administered 2016-08-01 (×2): 25 ug via INTRAVENOUS

## 2016-08-01 MED ORDER — LACTATED RINGERS IV SOLN
INTRAVENOUS | Status: DC
  Administered 2016-08-01: 09:00:00 via INTRAVENOUS

## 2016-08-01 MED ORDER — FENTANYL CITRATE (PF) 100 MCG/2ML IJ SOLN: 25.0000 ug | INTRAMUSCULAR | Status: DC | PRN

## 2016-08-01 MED ORDER — IBUPROFEN 600 MG PO TABS: 600.0000 mg | ORAL_TABLET | Freq: Four times a day (QID) | ORAL | 1 refills | Status: DC | PRN

## 2016-08-01 MED ORDER — METHYLERGONOVINE MALEATE 0.2 MG/ML IJ SOLN
INTRAMUSCULAR | Status: AC
  Filled 2016-08-01: qty 1

## 2016-08-01 MED ORDER — ONDANSETRON HCL 4 MG/2ML IJ SOLN
4.0000 mg | Freq: Once | INTRAMUSCULAR | Status: AC
  Administered 2016-08-01: 4 mg via INTRAVENOUS

## 2016-08-01 SURGICAL SUPPLY — 32 items
BAG HAMPER (MISCELLANEOUS) ×3 IMPLANT
CLOTH BEACON ORANGE TIMEOUT ST (SAFETY) ×3 IMPLANT
COVER LIGHT HANDLE STERIS (MISCELLANEOUS) ×6 IMPLANT
COVER MAYO STAND XLG (DRAPE) ×3 IMPLANT
CURETTE VACUUM 9MM CVD CLR (CANNULA) ×2 IMPLANT
DECANTER SPIKE VIAL GLASS SM (MISCELLANEOUS) ×2 IMPLANT
FORMALIN 10 PREFIL 480ML (MISCELLANEOUS) ×3 IMPLANT
GAUZE SPONGE 4X4 16PLY XRAY LF (GAUZE/BANDAGES/DRESSINGS) ×3 IMPLANT
GLOVE BIOGEL PI IND STRL 6.5 (GLOVE) IMPLANT
GLOVE BIOGEL PI IND STRL 7.0 (GLOVE) ×1 IMPLANT
GLOVE BIOGEL PI IND STRL 9 (GLOVE) ×1 IMPLANT
GLOVE BIOGEL PI INDICATOR 6.5 (GLOVE) ×2
GLOVE BIOGEL PI INDICATOR 7.0 (GLOVE) ×2
GLOVE BIOGEL PI INDICATOR 9 (GLOVE) ×2
GLOVE ECLIPSE 9.0 STRL (GLOVE) ×3 IMPLANT
GLOVE SS N UNI LF 6.5 STRL (GLOVE) ×2 IMPLANT
GOWN SPEC L3 XXLG W/TWL (GOWN DISPOSABLE) ×3 IMPLANT
GOWN STRL REUS W/TWL LRG LVL3 (GOWN DISPOSABLE) ×3 IMPLANT
KIT BERKELEY 1ST TRIMESTER 3/8 (MISCELLANEOUS) ×3 IMPLANT
KIT ROOM TURNOVER AP CYSTO (KITS) ×3 IMPLANT
MARKER SKIN DUAL TIP RULER LAB (MISCELLANEOUS) ×3 IMPLANT
NDL SPNL 22GX3.5 QUINCKE BK (NEEDLE) ×1 IMPLANT
NEEDLE SPNL 22GX3.5 QUINCKE BK (NEEDLE) ×3 IMPLANT
NS IRRIG 1000ML POUR BTL (IV SOLUTION) ×3 IMPLANT
PACK BASIC III (CUSTOM PROCEDURE TRAY) ×3
PACK SRG BSC III STRL LF ECLPS (CUSTOM PROCEDURE TRAY) ×1 IMPLANT
PAD ARMBOARD 7.5X6 YLW CONV (MISCELLANEOUS) ×3 IMPLANT
SET BERKELEY SUCTION TUBING (SUCTIONS) ×3 IMPLANT
SYR CONTROL 10ML LL (SYRINGE) ×2 IMPLANT
TOWEL OR 17X26 4PK STRL BLUE (TOWEL DISPOSABLE) ×3 IMPLANT
VACURETTE 10MM (CANNULA) IMPLANT
VACURETTE 8MM (CANNULA) ×2 IMPLANT

## 2016-08-01 NOTE — H&P (Signed)
Expand All Collapse All   [] Hide copied text [name], femaleDOB:[birth year], 20 y.U.JWJ:[id number]  Preoperative History and Physical  Claire Harrison is a 20 y.o. G1P0 here for surgical management of miscarriage. Pt notes that she was evaluated at [facility] 2 nights ago and informed that there was no fetal heart movement. Pt reports that she followed up in office on yesterday with no decision made as to her next steps. Pt states that this was her first pregnancy. Pt reports that she is having associated symptoms of resolved vomiting x yesterday, nausea x yesterday, and vaginal spotting. Pt hasn't tried any medications for the relief of her symptoms. Pt denies any other symptoms.   No significant preoperative concerns.  Proposed surgery: D&C  History reviewed. No pertinent past medical history. History reviewed. No pertinent surgical history.                 OB History  Gravida Para Term Preterm AB Living  1            SAB TAB Ectopic Multiple Live Births               # Outcome Date GA Lbr Len/2nd Weight Sex Delivery Anes PTL Lv  1 Current             Patient denies any other pertinent gynecologic issues.         Current Outpatient Prescriptions on File Prior to Visit  Medication Sig Dispense Refill  . cephALEXin (KEFLEX) 500 MG capsule Take 1 capsule (500 mg total) by mouth 4 (four) times daily. X 7 days 28 capsule 0  . Prenatal Vit-Fe Fumarate-FA (PRENATAL VITAMIN PO) Take by mouth daily.      No current facility-administered medications on file prior to visit.        Allergies  Allergen Reactions  . Latex Rash    Social History:   reports that she has never smoked. She has never used smokeless tobacco. She reports that she does not drink alcohol or use drugs.       Family History  Problem Relation Age of Onset  . Diabetes Mother   . Hypertension Mother   . Kidney failure Mother   . Diabetes Sister   .  Diabetes Sister   . Diabetes Sister     Review of Systems: Noncontributory  PHYSICAL EXAM: Blood pressure 118/72, pulse 74, height 5\' 2"  (1.575 m), weight 223 lb (101.2 kg), last menstrual period 05/18/2016. General appearance - alert, well appearing, and in no distress Chest - clear to auscultation, no wheezes, rales or rhonchi, symmetric air entry Heart - normal rate and regular rhythm Abdomen - soft, nontender, nondistended, no masses or organomegaly Pelvic - normal external genitalia, vulva, vagina, cervix, uterus and adnexa,  VULVA: normal appearing vulva with no masses, tenderness or lesions,  VAGINA: normal appearing vagina with normal color and discharge, no lesions, DNA probe for chlamydia and GC obtained,  CERVIX: normal appearing cervix without discharge or lesions,  UTERUS: uterus is normal size, shape, consistency and nontender, mid-position ADNEXA: normal adnexa in size, nontender and no masses Extremities - peripheral pulses normal, no pedal edema, no clubbing or cyanosis  Labs:      Results for orders placed or performed in visit on 07/30/16 (from the past 336 hour(s))  POCT Urinalysis Dipstick   Collection Time: 07/30/16 11:00 AM  Result Value Ref Range   Color, UA     Clarity, UA     Glucose, UA  neg    Bilirubin, UA     Ketones, UA neg    Spec Grav, UA     Blood, UA trace    pH, UA     Protein, UA neg    Urobilinogen, UA     Nitrite, UA neg    Leukocytes, UA Negative Negative  Microscopic Examination   Collection Time: 07/30/16  1:00 PM  Result Value Ref Range   WBC, UA 0-5 0 - 5 /hpf   RBC, UA 0-2 0 - 2 /hpf   Epithelial Cells (non renal) 0-10 0 - 10 /hpf   Casts None seen None seen /lpf   Mucus, UA Present Not Estab.   Bacteria, UA Few None seen/Few  Urinalysis, Routine w reflex microscopic (not at [facility])   Collection Time: 07/30/16  1:00 PM  Result Value Ref Range   Specific Gravity, UA 1.013 1.005 -  1.030   pH, UA 6.0 5.0 - 7.5   Color, UA Yellow Yellow   Appearance Ur Clear Clear   Leukocytes, UA Negative Negative   Protein, UA Negative Negative/Trace   Glucose, UA Negative Negative   Ketones, UA Negative Negative   RBC, UA Trace (A) Negative   Bilirubin, UA Negative Negative   Urobilinogen, Ur 0.2 0.2 - 1.0 mg/dL   Nitrite, UA Negative Negative   Microscopic Examination See below:   Results for orders placed or performed in visit on 07/21/16 (from the past 336 hour(s))  POCT urinalysis dipstick   Collection Time: 07/21/16  4:16 PM  Result Value Ref Range   Color, UA yellow    Clarity, UA clear    Glucose, UA neg    Bilirubin, UA     Ketones, UA neg    Spec Grav, UA     Blood, UA neg    pH, UA     Protein, UA neg    Urobilinogen, UA     Nitrite, UA neg    Leukocytes, UA Negative Negative  POCT Wet Prep [name]([place])   Collection Time: 07/21/16  4:34 PM  Result Value Ref Range   Source Wet Prep POC vaginal    WBC, Wet Prep HPF POC few    Bacteria Wet Prep HPF POC None None, Few, Too numerous to count   BACTERIA WET PREP MORPHOLOGY POC     Clue Cells Wet Prep HPF POC None None, Too numerous to count   Clue Cells Wet Prep Whiff POC Negative Whiff    Yeast Wet Prep HPF POC None    KOH Wet Prep POC     Trichomonas Wet Prep HPF POC Absent Absent  GC/Chlamydia Probe Amp   Collection Time: 07/21/16  4:45 PM  Result Value Ref Range   Chlamydia trachomatis, NAA Negative Negative   Neisseria gonorrhoeae by PCR Negative Negative  Urine culture   Collection Time: 07/21/16  4:45 PM  Result Value Ref Range   Urine Culture, Routine Final report (A)    Urine Culture result 1 Escherichia coli (A)    ANTIMICROBIAL SUSCEPTIBILITY Comment     Imaging Studies:  ImagingResults  [place] Ob Comp Less 14 Wks  Result Date: 07/16/2016 DATING AND VIABILITY SONOGRAM Claire Harrison is a 20 y.o. year old G1P0 with LMP  05/18/2016 which would correlate to  4327w2d weeks gestation.  She has irregular menstrual cycles.   She is here today for a confirmatory initial sonogram. GESTATION: SINGLETON   FETAL ACTIVITY:          Heart  rate         171        CERVIX: Appears closed ADNEXA: The ovaries are normal. GESTATIONAL AGE AND  BIOMETRICS: Gestational criteria: Estimated Date of Delivery: 02/22/17 by LMP now at 8084w2d Previous Scans:0    CROWN RUMP LENGTH           18.7 mm         8+2 weeks                                                                      AVERAGE EGA(BY THIS SCAN):  8+2 weeks WORKING EDD( LMP ):  02/22/2017  TECHNICIAN COMMENTS: US 8+2 wks,single IUP w/ys,pos fht 171 bpm,normal ov's bilat,crl 18.7 mm,subchorionic hemorrhage 2.5 x .7 x 1.6 cm A copy of this report including all images has been saved and backed up to a second source for retrieval if needed. All measures and details of the anatomical scan, placentation, fluid volume and pelvic anatomy are contained in that report. Amber Flora LippsJ Carl 07/15/2016 10:54 AM Clinical Impression and recommendations: I have reviewed the sonogram results above, combined with the patient's current clinical course, below are my impressions and any appropriate recommendations for management based on the sonographic findings. Viable early IUP G1P0 Estimated Date of Delivery: 02/22/17 LMP, today's sonogram Normal general sonographic findings Recommend routine care unless otherwise clinically indicated EURE,LUTHER H 07/16/2016 6:54 PM     Assessment: Missed AB      Patient Active Problem List   Diagnosis Date Noted  . UTI (urinary tract infection) during pregnancy, first trimester 07/24/2016  . Pregnant 07/21/2016     Plan: Patient will undergo surgical management with lab work completed today and D&C scheduled for 9:45 AM tomorrow.    Tilda BurrowJohn V Jull Harral, MD 07/31/2016 2:01 PM  By signing my name below, I, Soijett Blue, attest that this documentation has been prepared  under the direction and in the presence of Tilda BurrowJohn V Arcenia Scarbro, MD. Electronically Signed: Soijett Blue, ED Scribe. 07/31/16. 2:01 PM.  I personally performed the services described in this documentation, which was SCRIBED in my presence. The recorded information has been reviewed and considered accurate. It has been edited as necessary during review. Tilda BurrowFERGUSON,Jannelle Notaro V, MD       Electronically signed by Tilda BurrowJohn V Jlyn Cerros, MD at 07/31/2016 2:29 PM

## 2016-08-01 NOTE — Transfer of Care (Signed)
Immediate Anesthesia Transfer of Care Note  Patient: Claire Harrison  Procedure(s) Performed: Procedure(s): SUCTION DILATATION AND CURETTAGE (N/A)  Patient Location: PACU  Anesthesia Type:General  Level of Consciousness: awake, oriented and patient cooperative  Airway & Oxygen Therapy: Patient Spontanous Breathing and Patient connected to face mask oxygen  Post-op Assessment: Report given to RN and Post -op Vital signs reviewed and stable  Post vital signs: Reviewed and stable  Last Vitals:  Vitals:   08/01/16 0945 08/01/16 0950  BP: (!) 101/54 (!) 100/52  Pulse:    Resp: 17 17  Temp:      Last Pain:  Vitals:   08/01/16 0827  TempSrc: Oral  PainSc: 5       Patients Stated Pain Goal: 7 (08/01/16 0827)  Complications: No apparent anesthesia complications

## 2016-08-01 NOTE — OR Nursing (Signed)
Patient expressed feeling anxious and depressed regarding her miscarriage. Feeling concerned that she will sink into a depression. Dr. Emelda FearFerguson notified. Advised allowing patient time to grieve per Dinah Beers. Greene RN. Patient education done with patient, spouse and friend on resources for counseling and coping, reassurance and active listening to patient concerns and questions as well as offer of a chaplain done. Upon discharge patient expressed that she was encouraged and feeling much better. States she felt more positive and was laughing with her friend.

## 2016-08-01 NOTE — Discharge Instructions (Signed)
Incomplete Miscarriage A miscarriage is the sudden loss of an unborn baby (fetus) before the 20th week of pregnancy. In an incomplete miscarriage, parts of the fetus or placenta (afterbirth) remain in the body.  Having a miscarriage can be an emotional experience. Talk with your health care provider about any questions you may have about miscarrying, the grieving process, and your future pregnancy plans. CAUSES   Problems with the fetal chromosomes that make it impossible for the baby to develop normally. Problems with the baby's genes or chromosomes are most often the result of errors that occur by chance as the embryo divides and grows. The problems are not inherited from the parents.  Infection of the cervix or uterus.  Hormone problems.  Problems with the cervix, such as having an incompetent cervix. This is when the tissue in the cervix is not strong enough to hold the pregnancy.  Problems with the uterus, such as an abnormally shaped uterus, uterine fibroids, or congenital abnormalities.  Certain medical conditions.  Smoking, drinking alcohol, or taking illegal drugs.  Trauma. SYMPTOMS   Vaginal bleeding or spotting, with or without cramps or pain.  Pain or cramping in the abdomen or lower back.  Passing fluid, tissue, or blood clots from the vagina. DIAGNOSIS  Your health care provider will perform a physical exam. You may also have an ultrasound to confirm the miscarriage. Blood or urine tests may also be ordered. TREATMENT   Usually, a dilation and curettage (D&C) procedure is performed. During a D&C procedure, the cervix is widened (dilated) and any remaining fetal or placental tissue is gently removed from the uterus.  Antibiotic medicines are prescribed if there is an infection. Other medicines may be given to reduce the size of the uterus (contract) if there is a lot of bleeding.  If you have Rh negative blood and your baby was Rh positive, you will need a Rho (D)  immune globulin shot. This shot will protect any future baby from having Rh blood problems in future pregnancies.  You may be confined to bed rest. This means you should stay in bed and only get up to use the bathroom. HOME CARE INSTRUCTIONS   Rest as directed by your health care provider.  Restrict activity as directed by your health care provider. You may be allowed to continue light activity if curettage was not done but you require further treatment.  Keep track of the number of pads you use each day. Keep track of how soaked (saturated) they are. Record this information.  Do not  use tampons.  Do not douche or have sexual intercourse until approved by your health care provider.  Keep all follow-up appointments for reevaluation and continuing management.  Only take over-the-counter or prescription medicines for pain, fever, or discomfort as directed by your health care provider.  Take antibiotic medicine as directed by your health care provider. Make sure you finish it even if you start to feel better. SEEK IMMEDIATE MEDICAL CARE IF:   You experience severe cramps in your stomach, back, or abdomen.  You have an unexplained temperature (make sure to record these temperatures).  You pass large clots or tissue (save these for your health care provider to inspect).  Your bleeding increases.  You become light-headed, weak, or have fainting episodes. MAKE SURE YOU:   Understand these instructions.  Will watch your condition.  Will get help right away if you are not doing well or get worse.   This information is not intended to   replace advice given to you by your health care provider. Make sure you discuss any questions you have with your health care provider.   Document Released: 09/15/2005 Document Revised: 10/06/2014 Document Reviewed: 04/14/2013 Elsevier Interactive Patient Education 2016 Elsevier Inc.  

## 2016-08-01 NOTE — Anesthesia Procedure Notes (Signed)
Procedure Name: LMA Insertion Date/Time: 08/01/2016 10:40 AM Performed by: Pernell DupreADAMS, Sylver Vantassell A Pre-anesthesia Checklist: Patient identified, Timeout performed, Emergency Drugs available, Suction available and Patient being monitored Patient Re-evaluated:Patient Re-evaluated prior to inductionOxygen Delivery Method: Circle system utilized Preoxygenation: Pre-oxygenation with 100% oxygen Intubation Type: IV induction Ventilation: Mask ventilation without difficulty LMA: LMA inserted LMA Size: 4.0 Number of attempts: 1 Placement Confirmation: positive ETCO2 and breath sounds checked- equal and bilateral Tube secured with: Tape Dental Injury: Teeth and Oropharynx as per pre-operative assessment

## 2016-08-01 NOTE — Anesthesia Preprocedure Evaluation (Signed)
Anesthesia Evaluation  Patient identified by MRN, date of birth, ID band Patient awake    Reviewed: Allergy & Precautions, NPO status , Patient's Chart, lab work & pertinent test results  Airway Mallampati: II  TM Distance: >3 FB Neck ROM: Full    Dental  (+) Teeth Intact   Pulmonary neg pulmonary ROS,    breath sounds clear to auscultation       Cardiovascular negative cardio ROS   Rhythm:Regular Rate:Normal     Neuro/Psych    GI/Hepatic negative GI ROS,   Endo/Other  Morbid obesity  Renal/GU      Musculoskeletal   Abdominal   Peds  Hematology   Anesthesia Other Findings   Reproductive/Obstetrics                             Anesthesia Physical Anesthesia Plan  ASA: II  Anesthesia Plan: General   Post-op Pain Management:    Induction: Intravenous  Airway Management Planned: LMA  Additional Equipment:   Intra-op Plan:   Post-operative Plan: Extubation in OR  Informed Consent: I have reviewed the patients History and Physical, chart, labs and discussed the procedure including the risks, benefits and alternatives for the proposed anesthesia with the patient or authorized representative who has indicated his/her understanding and acceptance.     Plan Discussed with:   Anesthesia Plan Comments:         Anesthesia Quick Evaluation

## 2016-08-01 NOTE — Brief Op Note (Signed)
08/01/2016  11:10 AM  PATIENT:  Claire Harrison  20 y.o. female  PRE-OPERATIVE DIAGNOSIS:  missed abortion 8-9 weeks \ POST-OPERATIVE DIAGNOSIS:  missed abortion, 8-9 weeks, completed  PROCEDURE:  Procedure(s): SUCTION DILATATION AND CURETTAGE (N/A)  SURGEON:  Surgeon(s) and Role:    * Tilda BurrowJohn V Thomes Burak, MD - Primary  PHYSICIAN ASSISTANT:   ASSISTANTS: none   ANESTHESIA:   general, paracervical block and With LMA  EBL:  Total I/O In: 600 [I.V.:600] Out: 100 [Blood:100]  BLOOD ADMINISTERED:none  DRAINS: none   LOCAL MEDICATIONS USED:  MARCAINE    and Amount: 20 ml  SPECIMEN:  Source of Specimen:  Products of conception  DISPOSITION OF SPECIMEN:  PATHOLOGY  COUNTS:  YES  TOURNIQUET:  * No tourniquets in log *  DICTATION: .Dragon Dictation  PLAN OF CARE: Discharge to home after PACU  PATIENT DISPOSITION:  PACU - hemodynamically stable.   Delay start of Pharmacological VTE agent (>24hrs) due to surgical blood loss or risk of bleeding: not applicable Details of procedure. Patient was taken operating room prepped and draped for vaginal procedure with timeout conducted and surgical procedure confirmed by operative team. No antibiotics were indicated cervix was grasped with single-tooth tenaculum, sounded to 11 cm dilated to 27 [place] allowing introduction of a 8 mm curved suction curet with circumferential suction curettage performed obtaining appropriate fluid and tissue fragments consistent with missed AB curettage with a small curet was then performed feel no residual tissue. Repeat suction curettage briefly repeated. No additional tissue obtained. Chart review again confirms blood type B positive. Patient to recovery room in stable condition

## 2016-08-01 NOTE — Op Note (Signed)
Please see brief operative procedure for pertinent surgical details

## 2016-08-01 NOTE — Anesthesia Postprocedure Evaluation (Signed)
Anesthesia Post Note Late Entry for 1214 Patient: Claire Harrison  Procedure(s) Performed: Procedure(s) (LRB): SUCTION DILATATION AND CURETTAGE (N/A)  Patient location during evaluation: PACU Anesthesia Type: General Level of consciousness: awake and alert and oriented Pain management: pain level controlled Vital Signs Assessment: post-procedure vital signs reviewed and stable Respiratory status: spontaneous breathing Cardiovascular status: stable Postop Assessment: no signs of nausea or vomiting Anesthetic complications: no    Last Vitals:  Vitals:   08/01/16 1215 08/01/16 1220  BP: 128/70   Pulse: 95 (!) 111  Resp: (!) 26 (!) 23  Temp:      Last Pain:  Vitals:   08/01/16 0827  TempSrc: Oral  PainSc: 5                  ADAMS, AMY A

## 2016-08-02 LAB — GC/CHLAMYDIA PROBE AMP
CHLAMYDIA, DNA PROBE: NEGATIVE
Neisseria gonorrhoeae by PCR: NEGATIVE

## 2016-08-04 ENCOUNTER — Encounter (HOSPITAL_COMMUNITY): Payer: Self-pay | Admitting: Obstetrics and Gynecology

## 2016-08-06 ENCOUNTER — Ambulatory Visit: Payer: Medicaid Other | Admitting: Adult Health

## 2016-08-13 ENCOUNTER — Encounter: Payer: Self-pay | Admitting: Adult Health

## 2016-08-13 ENCOUNTER — Ambulatory Visit (INDEPENDENT_AMBULATORY_CARE_PROVIDER_SITE_OTHER): Payer: Medicaid Other | Admitting: Adult Health

## 2016-08-13 VITALS — BP 98/62 | HR 84 | Ht 62.0 in | Wt 224.0 lb

## 2016-08-13 DIAGNOSIS — F329 Major depressive disorder, single episode, unspecified: Secondary | ICD-10-CM

## 2016-08-13 DIAGNOSIS — F32A Depression, unspecified: Secondary | ICD-10-CM

## 2016-08-13 DIAGNOSIS — N938 Other specified abnormal uterine and vaginal bleeding: Secondary | ICD-10-CM

## 2016-08-13 DIAGNOSIS — Z8759 Personal history of other complications of pregnancy, childbirth and the puerperium: Secondary | ICD-10-CM

## 2016-08-13 NOTE — Progress Notes (Signed)
Subjective:     Patient ID: Claire Harrison, female   DOB: 01/22/1996, 20 y.o.   MRN: [id number]  HPI Claire Harrison is a 20 year old Hispanic female back in follow on depression with recent miscarriage and D&C(08/01/16).She has had some bleeding on and off since D&C, but dark.She says she is better.And wants to get pregnant in near future.  Review of Systems +bleeding on and off Reviewed past medical,surgical, social and family history. Reviewed medications and allergies.     Objective:   Physical Exam BP 98/62 (BP Location: Left Arm, Patient Position: Sitting, Cuff Size: Large)   Pulse 84   Ht 5\' 2"  (1.575 m)   Wt 224 lb (101.6 kg)   LMP 05/18/2016 (Approximate)   Breastfeeding? No   BMI 40.97 kg/m  Skin warm and dry.Pelvic: external genitalia is normal in appearance no lesions, vagina: white discharge without odor,urethra has no lesions or masses noted, cervix:smooth and bulbous, uterus: normal size, shape and contour, non tender, no masses felt, adnexa: no masses or tenderness noted. Bladder is non tender and no masses felt.   PHQ 9 score 6, which is better and she says she is better and declines meds, is smiling today. Assessment:     1. Depression, unspecified depression type   2. History of miscarriage       Plan:     Continue prenatal vitamins Follow up in 3 months

## 2016-08-13 NOTE — Patient Instructions (Signed)
Take prenatal vitamins Follow up in 3 months

## 2016-09-15 ENCOUNTER — Other Ambulatory Visit: Payer: Self-pay | Admitting: Adult Health

## 2016-09-15 ENCOUNTER — Ambulatory Visit (INDEPENDENT_AMBULATORY_CARE_PROVIDER_SITE_OTHER): Payer: Medicaid Other | Admitting: Adult Health

## 2016-09-15 ENCOUNTER — Encounter: Payer: Self-pay | Admitting: Adult Health

## 2016-09-15 ENCOUNTER — Ambulatory Visit (INDEPENDENT_AMBULATORY_CARE_PROVIDER_SITE_OTHER): Payer: Medicaid Other

## 2016-09-15 VITALS — BP 108/50 | HR 78 | Ht 62.0 in | Wt 224.5 lb

## 2016-09-15 DIAGNOSIS — Z3201 Encounter for pregnancy test, result positive: Secondary | ICD-10-CM

## 2016-09-15 DIAGNOSIS — R350 Frequency of micturition: Secondary | ICD-10-CM

## 2016-09-15 DIAGNOSIS — Z3A01 Less than 8 weeks gestation of pregnancy: Secondary | ICD-10-CM | POA: Diagnosis not present

## 2016-09-15 DIAGNOSIS — N644 Mastodynia: Secondary | ICD-10-CM

## 2016-09-15 DIAGNOSIS — Z3490 Encounter for supervision of normal pregnancy, unspecified, unspecified trimester: Secondary | ICD-10-CM

## 2016-09-15 DIAGNOSIS — O3680X0 Pregnancy with inconclusive fetal viability, not applicable or unspecified: Secondary | ICD-10-CM | POA: Diagnosis not present

## 2016-09-15 DIAGNOSIS — Z8759 Personal history of other complications of pregnancy, childbirth and the puerperium: Secondary | ICD-10-CM

## 2016-09-15 LAB — POCT URINE PREGNANCY: PREG TEST UR: POSITIVE — AB

## 2016-09-15 NOTE — Progress Notes (Signed)
US TA/TV: 5+6 wks GS,w/a ? Small YS,no fetal pole seen,normal ov's bilat,pt is having labs done today and f/u ultrasound in 10 days.

## 2016-09-15 NOTE — Progress Notes (Addendum)
Subjective:     Patient ID: Claire Harrison, female   DOB: 06/07/1996, 20 y.o.   MRN: [id number]  HPI Claire Harrison is a 20 year old Hispanic female in complaining of being dizzy yesterday and [facility] and was told was [facility].She had D&C 11/3 by Dr Emelda FearFerguson.Has breast tenderness and frequent urination and is emotional.No dizziness today. She already had the appt today for UPT, [facility]  Review of Systems +dizzy +emotional and breast tenderness and frequent urination Reviewed past medical,surgical, social and family history. Reviewed medications and allergies.     Objective:   Physical Exam BP (!) 108/50 (BP Location: Left Arm, Patient Position: Sitting, Cuff Size: Large)   Pulse 78   Ht 5\' 2"  (1.575 m)   Wt 224 lb 8 oz (101.8 kg)   LMP 05/18/2016 (Approximate)   BMI 41.06 kg/m UPT +, [facility] 6609.Skin warm and dry. Neck: mid line trachea, normal thyroid, good ROM, no lymphadenopathy noted. Lungs: clear to ausculation bilaterally. Cardiovascular: regular rate and rhythm. PHQ 9 score 2. Will get US ASAP.    Assessment:     1. Pregnancy examination or test, positive result   2. Pregnancy, unspecified gestational age   243. History of miscarriage   4. Encounter to determine fetal viability of pregnancy, single or unspecified fetus       Plan:     Return at 4 pm today for US Push fluids

## 2016-09-15 NOTE — Patient Instructions (Signed)
Push fluids Return in 4 pm

## 2016-09-17 ENCOUNTER — Telehealth: Payer: Self-pay | Admitting: Adult Health

## 2016-09-17 LAB — PROGESTERONE: PROGESTERONE: 10.8 ng/mL

## 2016-09-17 NOTE — Telephone Encounter (Signed)
Pt called stating that she would like to know the results of her blood work. Please contact pt °

## 2016-09-17 NOTE — Telephone Encounter (Signed)
Spoke with pt letting her know progesterone was good at 10.8. Pt voiced understanding. JSY

## 2016-09-18 ENCOUNTER — Telehealth: Payer: Self-pay | Admitting: *Deleted

## 2016-09-18 NOTE — Telephone Encounter (Signed)
Spoke with pt. Pt fell yesterday and is cramping. No bleeding. I spoke with Dr. Emelda FearFerguson and he advised everything sounds ok. Her body is changing and she is going to cramp per Dr. Emelda FearFerguson. Pt was advised to take it easy and push fluids. If anything changes, call us back or call the after hours nurse line. Pt voiced understanding. JSY

## 2016-09-18 NOTE — Telephone Encounter (Signed)
Mailbox full @ 4:47 pm. Unable to leave message. JSY

## 2016-09-24 ENCOUNTER — Telehealth: Payer: Self-pay | Admitting: Adult Health

## 2016-09-24 ENCOUNTER — Other Ambulatory Visit: Payer: Self-pay | Admitting: Obstetrics & Gynecology

## 2016-09-24 DIAGNOSIS — O3680X0 Pregnancy with inconclusive fetal viability, not applicable or unspecified: Secondary | ICD-10-CM

## 2016-09-24 NOTE — Telephone Encounter (Signed)
Spoke with pt. Pt has a severe headache. No history of headaches. Noticed headache after a nose bleed. Pt does have a history of nose bleeds. I spoke with Dr. Emelda FearFerguson and he advised to try OTC Claritin or Zyrtec. Pt states she had some fever Sunday. I advised can take Tylenol for fever. Pt voiced understanding and has a dating US scheduled for tomorrow. JSY

## 2016-09-25 ENCOUNTER — Ambulatory Visit (INDEPENDENT_AMBULATORY_CARE_PROVIDER_SITE_OTHER): Payer: Medicaid Other

## 2016-09-25 DIAGNOSIS — Z3A01 Less than 8 weeks gestation of pregnancy: Secondary | ICD-10-CM | POA: Diagnosis not present

## 2016-09-25 DIAGNOSIS — O3680X0 Pregnancy with inconclusive fetal viability, not applicable or unspecified: Secondary | ICD-10-CM | POA: Diagnosis not present

## 2016-09-25 NOTE — Progress Notes (Signed)
US 6+2 wks,single IUP w/ys, pos fht 122 bpm,normal ov's bilat,crl 6.1 mm,EDD 05/19/2017 by ultrasound

## 2016-10-12 ENCOUNTER — Emergency Department (HOSPITAL_COMMUNITY)
Admission: EM | Admit: 2016-10-12 | Discharge: 2016-10-12 | Disposition: A | Discharge status: Home / Self Care | Payer: Medicaid Other | Attending: Emergency Medicine | Admitting: Emergency Medicine

## 2016-10-12 ENCOUNTER — Encounter (HOSPITAL_COMMUNITY): Payer: Self-pay | Admitting: Emergency Medicine

## 2016-10-12 DIAGNOSIS — O99711 Diseases of the skin and subcutaneous tissue complicating pregnancy, first trimester: Secondary | ICD-10-CM | POA: Diagnosis present

## 2016-10-12 DIAGNOSIS — Z3A08 8 weeks gestation of pregnancy: Secondary | ICD-10-CM | POA: Diagnosis not present

## 2016-10-12 DIAGNOSIS — R21 Rash and other nonspecific skin eruption: Secondary | ICD-10-CM | POA: Diagnosis not present

## 2016-10-12 NOTE — Discharge Instructions (Signed)
As discussed,  your rash does not appear to be an infection or any allergic reaction to the peanuts you ate yesterday.  This could be mild acne or skin changes related to your pregnancy (hormone changes).  It is safe to apply benadryl cream if needed for itch relief.  See your doctor tomorrow as you have scheduled.

## 2016-10-12 NOTE — ED Provider Notes (Signed)
[facility] Provider Note   CSN: [id number] Arrival date & time: 10/12/16  1729   By signing my name below, I, Cynda Acres, attest that this documentation has been prepared under the direction and in the presence of Burgess Amor, PA-C Electronically Signed: Cynda Acres, Scribe. 10/12/16. 6:55 PM.  History   Chief Complaint Chief Complaint  Patient presents with  . Rash    HPI Comments: Claire Harrison is a 21 y.o. female who presents to the Emergency Department complaining of a gradually worsening rash that began 2 days ago. Patient reports associated throbbing and itching.  She also endorses mild nausea with am vomiting which she associates with her pregnancy. Patient states she uses coconut oil, axe spray, and baby oil on her face, products which she has used for a long time without any reaction. Patient also reports she is allergic to cashews only, she ate peanuts yesterday. She states she has never had a reaction to peanuts. She denies facial, mouth or tongue swelling and has had no shortness of breath, cough or wheezing.  She also denies fevers, chills, sore throat, congestion or other coryza like symptoms.  Patient is [redacted] weeks pregnant and has an appointment with [facility] in the am. No modifying factors. She denies the use of any new products and has been on the same prenatal vitamin since she had a D&C in early November.  She has had no treatment prior to arrival for this rash.   The history is provided by the patient. No language interpreter was used.    Past Medical History:  Diagnosis Date  . Medical history non-contributory     Patient Active Problem List   Diagnosis Date Noted  . Abortion, missed 08/01/2016  . UTI (urinary tract infection) during pregnancy, first trimester 07/24/2016  . Pregnant 07/21/2016    Past Surgical History:  Procedure Laterality Date  . DILATION AND CURETTAGE OF UTERUS N/A 08/01/2016   Procedure: SUCTION DILATATION AND CURETTAGE;   Surgeon: Tilda Burrow, MD;  Location: [facility];  Service: Gynecology;  Laterality: N/A;  . NO PAST SURGERIES      OB History    Gravida Para Term Preterm AB Living   2       1     SAB TAB Ectopic Multiple Live Births   1               Home Medications    Prior to Admission medications   Medication Sig Start Date End Date Taking? Authorizing Provider  Prenatal Vit-Fe Fumarate-FA (PRENATAL VITAMIN PO) Take by mouth daily.     Historical Provider, MD    Family History Family History  Problem Relation Age of Onset  . Diabetes Mother   . Hypertension Mother   . Kidney failure Mother   . Miscarriages / [place] Mother   . Diabetes Sister   . Miscarriages / Stillbirths Sister   . Diabetes Sister   . Miscarriages / [place] Sister     Social History Social History  Substance Use Topics  . Smoking status: Never Smoker  . Smokeless tobacco: Never Used  . Alcohol use No     Allergies   Mango flavor; Peanut-containing drug products; and Latex   Review of Systems Review of Systems  Constitutional: Negative for chills and fever.  Respiratory: Negative for shortness of breath and wheezing.   Skin: Positive for color change and rash.  Neurological: Negative for numbness.     Physical Exam Updated Vital Signs  BP 121/64 (BP Location: Left Arm)   Pulse 91   Temp 98.1 F (36.7 C) (Oral)   Resp 18   Ht 5\' 2"  (1.575 m)   Wt 102.1 kg   LMP 05/18/2016 (LMP Unknown)   SpO2 96%   BMI 41.15 kg/m   Physical Exam  Constitutional: She is oriented to person, place, and time. She appears well-developed and well-nourished.  HENT:  Head: Normocephalic and atraumatic.  Eyes: EOM are normal. Pupils are equal, round, and reactive to light.  Neck: Normal range of motion. Neck supple.  Cardiovascular: Normal rate and regular rhythm.   Pulmonary/Chest: Effort normal and breath sounds normal.  Musculoskeletal: Normal range of motion.  Neurological: She is alert and  oriented to person, place, and time.  Skin: Skin is warm and dry.  She has tiny scattered raised papules on the bilateral cheeks and chin. Less involving the chin, no pustules, no drainage, and non tender. No neck or trunk involvement.   Psychiatric: She has a normal mood and affect.  Nursing note and vitals reviewed.    ED Treatments / Results  DIAGNOSTIC STUDIES: Oxygen Saturation is 96% on RA, normal by my interpretation.    COORDINATION OF CARE: 6:38 PM Discussed treatment plan with pt at bedside and pt agreed to plan.  Labs (all labs ordered are listed, but only abnormal results are displayed) Labs Reviewed - No data to display  EKG  EKG Interpretation None       Radiology No results found.  Procedures Procedures (including critical care time)  Medications Ordered in ED Medications - No data to display   Initial Impression / Assessment and Plan / ED Course  I have reviewed the triage vital signs and the nursing notes.  Pertinent labs & imaging results that were available during my care of the patient were reviewed by me and considered in my medical decision making (see chart for details).  Clinical Course     Nonspecific rash. Does not appear to be infectious or allergic reaction.  No hives, no sob.  Possible hormone changes with pregnancy?  She was encouraged f/u with gyn in the am as planned.  Could try topical benadryl cream prn for itching.    The patient appears reasonably screened and/or stabilized for discharge and I doubt any other medical condition or other [facility] requiring further screening, evaluation, or treatment in the ED at this time prior to discharge.   Final Clinical Impressions(s) / ED Diagnoses   Final diagnoses:  Rash    New Prescriptions Discharge Medication List as of 10/12/2016  6:54 PM     I personally performed the services described in this documentation, which was scribed in my presence. The recorded information has been reviewed  and is accurate.    Burgess AmorJulie Marthena Whitmyer, PA-C 10/12/16 2036    Eber HongBrian Miller, MD 10/14/16 [id number]

## 2016-10-12 NOTE — ED Triage Notes (Signed)
Patient c/o rash to face with itching. Per patient appeared this morning. Denies any swelling or difficulty breathing. Per patient only new medication is prenatal vitamins. Patient 8 weeks and 6 days pregnant. Denies any new lotions, detergents, foods, or soaps.

## 2016-10-13 ENCOUNTER — Encounter: Payer: Self-pay | Admitting: Women's Health

## 2016-10-13 ENCOUNTER — Ambulatory Visit (INDEPENDENT_AMBULATORY_CARE_PROVIDER_SITE_OTHER): Payer: Medicaid Other | Admitting: Women's Health

## 2016-10-13 VITALS — BP 104/67 | HR 80 | Ht 61.0 in | Wt 224.0 lb

## 2016-10-13 DIAGNOSIS — Z1389 Encounter for screening for other disorder: Secondary | ICD-10-CM | POA: Diagnosis not present

## 2016-10-13 DIAGNOSIS — Z23 Encounter for immunization: Secondary | ICD-10-CM

## 2016-10-13 DIAGNOSIS — Z3A09 9 weeks gestation of pregnancy: Secondary | ICD-10-CM | POA: Diagnosis not present

## 2016-10-13 DIAGNOSIS — O219 Vomiting of pregnancy, unspecified: Secondary | ICD-10-CM

## 2016-10-13 DIAGNOSIS — IMO0002 Reserved for concepts with insufficient information to code with codable children: Secondary | ICD-10-CM

## 2016-10-13 DIAGNOSIS — Z3490 Encounter for supervision of normal pregnancy, unspecified, unspecified trimester: Secondary | ICD-10-CM | POA: Insufficient documentation

## 2016-10-13 DIAGNOSIS — Z3481 Encounter for supervision of other normal pregnancy, first trimester: Secondary | ICD-10-CM | POA: Diagnosis not present

## 2016-10-13 DIAGNOSIS — Z331 Pregnant state, incidental: Secondary | ICD-10-CM | POA: Diagnosis not present

## 2016-10-13 DIAGNOSIS — Z3682 Encounter for antenatal screening for nuchal translucency: Secondary | ICD-10-CM

## 2016-10-13 LAB — POCT URINALYSIS DIPSTICK
Glucose, UA: NEGATIVE
KETONES UA: NEGATIVE
Leukocytes, UA: NEGATIVE
NITRITE UA: NEGATIVE
Protein, UA: NEGATIVE
RBC UA: NEGATIVE
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
## 2016-10-13 NOTE — Patient Instructions (Signed)

## 2016-10-15 LAB — URINALYSIS, ROUTINE W REFLEX MICROSCOPIC
BILIRUBIN UA: NEGATIVE
Glucose, UA: NEGATIVE
Ketones, UA: NEGATIVE
LEUKOCYTES UA: NEGATIVE
Nitrite, UA: NEGATIVE
PH UA: 5.5 (ref 5.0–7.5)
PROTEIN UA: NEGATIVE
RBC UA: NEGATIVE
Specific Gravity, UA: >=1.03 — AB (ref 1.005–1.030)
Urobilinogen, Ur: 1 mg/dL (ref 0.2–1.0)

## 2016-10-15 LAB — CBC
HEMOGLOBIN: 13.2 g/dL (ref 11.1–15.9)
Hematocrit: 37.9 % (ref 34.0–46.6)
MCH: 27.5 pg (ref 26.6–33.0)
MCHC: 34.8 g/dL (ref 31.5–35.7)
MCV: 79 fL (ref 79–97)
Platelets: 342 10*3/uL (ref 150–379)
RBC: 4.8 x10E6/uL (ref 3.77–5.28)
RDW: 13.5 % (ref 12.3–15.4)
WBC: 8.6 10*3/uL (ref 3.4–10.8)

## 2016-10-15 LAB — GC/CHLAMYDIA PROBE AMP
CHLAMYDIA, DNA PROBE: NEGATIVE
NEISSERIA GONORRHOEAE BY PCR: NEGATIVE

## 2016-10-15 LAB — PMP SCREEN PROFILE (10S), URINE
AMPHETAMINE SCRN UR: NEGATIVE ng/mL
Barbiturate Screen, Ur: NEGATIVE ng/mL
Benzodiazepine Screen, Urine: NEGATIVE ng/mL
CANNABINOIDS UR QL SCN: NEGATIVE ng/mL
Cocaine(Metab.)Screen, Urine: NEGATIVE ng/mL
Creatinine(Crt), U: 246.1 mg/dL (ref 20.0–300.0)
Methadone Scn, Ur: NEGATIVE ng/mL
OPIATE SCRN UR: NEGATIVE ng/mL
OXYCODONE+OXYMORPHONE UR QL SCN: NEGATIVE ng/mL
PCP SCRN UR: NEGATIVE ng/mL
PH UR, DRUG SCRN: 5.7 (ref 4.5–8.9)
Propoxyphene, Screen: NEGATIVE ng/mL

## 2016-10-15 LAB — HIV ANTIBODY (ROUTINE TESTING W REFLEX): HIV SCREEN 4TH GENERATION: NONREACTIVE

## 2016-10-15 LAB — URINE CULTURE

## 2016-10-15 LAB — HEPATITIS B SURFACE ANTIGEN: HEP B S AG: NEGATIVE

## 2016-10-15 LAB — RUBELLA SCREEN: RUBELLA: 0.91 {index} — AB (ref >0.99)

## 2016-10-15 LAB — ABO/RH: Rh Factor: POSITIVE

## 2016-10-15 LAB — ANTIBODY SCREEN: Antibody Screen: NEGATIVE

## 2016-10-15 LAB — SICKLE CELL SCREEN: SICKLE CELL SCREEN: NEGATIVE

## 2016-10-15 LAB — VARICELLA ZOSTER ANTIBODY, IGG: VARICELLA: 799 {index} (ref >165)

## 2016-10-15 LAB — RPR: RPR Ser Ql: NONREACTIVE

## 2016-10-17 ENCOUNTER — Telehealth: Payer: Self-pay | Admitting: *Deleted

## 2016-10-17 ENCOUNTER — Encounter: Payer: Self-pay | Admitting: Women's Health

## 2016-10-17 DIAGNOSIS — Z283 Underimmunization status: Secondary | ICD-10-CM | POA: Insufficient documentation

## 2016-10-17 DIAGNOSIS — O9989 Other specified diseases and conditions complicating pregnancy, childbirth and the puerperium: Secondary | ICD-10-CM

## 2016-10-17 DIAGNOSIS — O09899 Supervision of other high risk pregnancies, unspecified trimester: Secondary | ICD-10-CM | POA: Insufficient documentation

## 2016-10-17 NOTE — Telephone Encounter (Signed)
Pt wanted result of blood sugar. I informed patient her glucose was not checked with the routine initial blood work. I informed her she would have a 3 hr glucola around 27-28 wks. Pt verbalized understanding.

## 2016-10-30 ENCOUNTER — Telehealth: Payer: Self-pay | Admitting: Obstetrics & Gynecology

## 2016-10-30 NOTE — Telephone Encounter (Signed)
Spoke with patient, states she has felt flutters before but not today and is concerned and is having so mind cramping no bleeding. Informed patient that mild cramping can be normal and also not to feel the baby every day at this early in gestation. Pt verbalized understanding.

## 2016-11-11 ENCOUNTER — Encounter: Payer: Self-pay | Admitting: Advanced Practice Midwife

## 2016-11-11 ENCOUNTER — Ambulatory Visit (INDEPENDENT_AMBULATORY_CARE_PROVIDER_SITE_OTHER): Payer: Medicaid Other

## 2016-11-11 ENCOUNTER — Ambulatory Visit (INDEPENDENT_AMBULATORY_CARE_PROVIDER_SITE_OTHER): Payer: Medicaid Other | Admitting: Advanced Practice Midwife

## 2016-11-11 VITALS — BP 122/64 | HR 76 | Wt 221.0 lb

## 2016-11-11 DIAGNOSIS — Z3A13 13 weeks gestation of pregnancy: Secondary | ICD-10-CM

## 2016-11-11 DIAGNOSIS — Z1389 Encounter for screening for other disorder: Secondary | ICD-10-CM

## 2016-11-11 DIAGNOSIS — Z3682 Encounter for antenatal screening for nuchal translucency: Secondary | ICD-10-CM | POA: Diagnosis not present

## 2016-11-11 DIAGNOSIS — Z331 Pregnant state, incidental: Secondary | ICD-10-CM | POA: Diagnosis not present

## 2016-11-11 DIAGNOSIS — Z833 Family history of diabetes mellitus: Secondary | ICD-10-CM

## 2016-11-11 DIAGNOSIS — Z3481 Encounter for supervision of other normal pregnancy, first trimester: Secondary | ICD-10-CM

## 2016-11-11 NOTE — Patient Instructions (Signed)
[phone]! Babyscripts password  Second Trimester of Pregnancy The second trimester is from week 13 through week 28 (months 4 through 6). The second trimester is often a time when you feel your best. Your body has also adjusted to being pregnant, and you begin to feel better physically. Usually, morning sickness has lessened or quit completely, you may have more energy, and you may have an increase in appetite. The second trimester is also a time when the fetus is growing rapidly. At the end of the sixth month, the fetus is about 9 inches long and weighs about 1 pounds. You will likely begin to feel the baby move (quickening) between 18 and 20 weeks of the pregnancy. Body changes during your second trimester Your body continues to go through many changes during your second trimester. The changes vary from woman to woman.  Your weight will continue to increase. You will notice your lower abdomen bulging out.  You may begin to get stretch marks on your hips, abdomen, and breasts.  You may develop headaches that can be relieved by medicines. The medicines should be approved by your health care provider.  You may urinate more often because the fetus is pressing on your bladder.  You may develop or continue to have heartburn as a result of your pregnancy.  You may develop constipation because certain hormones are causing the muscles that push waste through your intestines to slow down.  You may develop hemorrhoids or swollen, bulging veins (varicose veins).  You may have back pain. This is caused by:  Weight gain.  Pregnancy hormones that are relaxing the joints in your pelvis.  A shift in weight and the muscles that support your balance.  Your breasts will continue to grow and they will continue to become tender.  Your gums may bleed and may be sensitive to brushing and flossing.  Dark spots or blotches (chloasma, mask of pregnancy) may develop on your face. This will likely fade  after the baby is born.  A dark line from your belly button to the pubic area (linea nigra) may appear. This will likely fade after the baby is born.  You may have changes in your hair. These can include thickening of your hair, rapid growth, and changes in texture. Some women also have hair loss during or after pregnancy, or hair that feels dry or thin. Your hair will most likely return to normal after your baby is born. What to expect at prenatal visits During a routine prenatal visit:  You will be weighed to make sure you and the fetus are growing normally.  Your blood pressure will be taken.  Your abdomen will be measured to track your baby's growth.  The fetal heartbeat will be listened to.  Any test results from the previous visit will be discussed. Your health care provider may ask you:  How you are feeling.  If you are feeling the baby move.  If you have had any abnormal symptoms, such as leaking fluid, bleeding, severe headaches, or abdominal cramping.  If you are using any tobacco products, including cigarettes, chewing tobacco, and electronic cigarettes.  If you have any questions. Other tests that may be performed during your second trimester include:  Blood tests that check for:  Low iron levels (anemia).  Gestational diabetes (between 24 and 28 weeks).  Rh antibodies. This is to check for a protein on red blood cells (Rh factor).  Urine tests to check for infections, diabetes, or protein in the urine.  An ultrasound to confirm the proper growth and development of the baby.  An amniocentesis to check for possible genetic problems.  Fetal screens for spina bifida and Down syndrome.  HIV (human immunodeficiency virus) testing. Routine prenatal testing includes screening for HIV, unless you choose not to have this test. Follow these instructions at home: Eating and drinking  Continue to eat regular, healthy meals.  Avoid raw meat, uncooked cheese, cat  litter boxes, and soil used by cats. These carry germs that can cause birth defects in the baby.  Take your prenatal vitamins.  Take 1500-2000 mg of calcium daily starting at the 20th week of pregnancy until you deliver your baby.  If you develop constipation:  Take over-the-counter or prescription medicines.  Drink enough fluid to keep your urine clear or pale yellow.  Eat foods that are high in fiber, such as fresh fruits and vegetables, whole grains, and beans.  Limit foods that are high in fat and processed sugars, such as fried and sweet foods. Activity  Exercise only as directed by your health care provider. Experiencing uterine cramps is a good sign to stop exercising.  Avoid heavy lifting, wear low heel shoes, and practice good posture.  Wear your seat belt at all times when driving.  Rest with your legs elevated if you have leg cramps or low back pain.  Wear a good support bra for breast tenderness.  Do not use hot tubs, steam rooms, or saunas. Lifestyle  Avoid all smoking, herbs, alcohol, and unprescribed drugs. These chemicals affect the formation and growth of the baby.  Do not use any products that contain nicotine or tobacco, such as cigarettes and e-cigarettes. If you need help quitting, ask your health care provider.  A sexual relationship may be continued unless your health care provider directs you otherwise. General instructions  Follow your health care provider's instructions regarding medicine use. There are medicines that are either safe or unsafe to take during pregnancy.  Take warm sitz baths to soothe any pain or discomfort caused by hemorrhoids. Use hemorrhoid cream if your health care provider approves.  If you develop varicose veins, wear support hose. Elevate your feet for 15 minutes, 3-4 times a day. Limit salt in your diet.  Visit your dentist if you have not gone yet during your pregnancy. Use a soft toothbrush to brush your teeth and be  gentle when you floss.  Keep all follow-up prenatal visits as told by your health care provider. This is important. Contact a health care provider if:  You have dizziness.  You have mild pelvic cramps, pelvic pressure, or nagging pain in the abdominal area.  You have persistent nausea, vomiting, or diarrhea.  You have a bad smelling vaginal discharge.  You have pain with urination. Get help right away if:  You have a fever.  You are leaking fluid from your vagina.  You have spotting or bleeding from your vagina.  You have severe abdominal cramping or pain.  You have rapid weight gain or weight loss.  You have shortness of breath with chest pain.  You notice sudden or extreme swelling of your face, hands, ankles, feet, or legs.  You have not felt your baby move in over an hour.  You have severe headaches that do not go away with medicine.  You have vision changes. Summary  The second trimester is from week 13 through week 28 (months 4 through 6). It is also a time when the fetus is growing rapidly.  Your body goes through many changes during pregnancy. The changes vary from woman to woman.  Avoid all smoking, herbs, alcohol, and unprescribed drugs. These chemicals affect the formation and growth your baby.  Do not use any tobacco products, such as cigarettes, chewing tobacco, and e-cigarettes. If you need help quitting, ask your health care provider.  Contact your health care provider if you have any questions. Keep all prenatal visits as told by your health care provider. This is important. This information is not intended to replace advice given to you by your health care provider. Make sure you discuss any questions you have with your health care provider. Document Released: 09/09/2001 Document Revised: 02/21/2016 Document Reviewed: 11/16/2012 Elsevier Interactive Patient Education  2017 [employer].

## 2016-11-11 NOTE — Progress Notes (Signed)
US 13 wks,measurements c/w dates,normal ov's bilat,NB present ,NT 1.6 mm,crl 76.7 mm

## 2016-11-11 NOTE — Progress Notes (Signed)
G2P0010 412w0d Estimated Date of Delivery: 05/19/17  Blood pressure 122/64, pulse 76, weight 221 lb (100.2 kg), last menstrual period 05/18/2016.   BP weight and urine results all reviewed and noted. US 13 wks,measurements c/w dates,normal ov's bilat,NB present ,NT 1.6 mm,crl 76.7 mm  Please refer to the obstetrical flow sheet for the fundal height and fetal heart rate documentation:  Patient denies any bleeding and no rupture of membranes symptoms or regular contractions. Patient is without complaints. All questions were answered.  Orders Placed This Encounter  Procedures  . Maternal Screen, Integrated #1  . Glucose Tolerance, 2 Hours w/1 Hour  . POCT urinalysis dipstick    Plan:  Continued routine obstetrical care, 2 hr gtt  Return in about 4 weeks (around 12/09/2016) for LROB, 2nd IT.

## 2016-11-13 ENCOUNTER — Ambulatory Visit: Payer: Medicaid Other | Admitting: Adult Health

## 2016-11-19 LAB — MATERNAL SCREEN, INTEGRATED #1
CROWN RUMP LENGTH MAT SCREEN: 76.7 mm
Gest. Age on Collection Date: 13.3 weeks
Maternal Age at EDD: 23.3 years
Nuchal Translucency (NT): 1.6 mm
Number of Fetuses: 1
PAPP-A Value: 275.4 ng/mL
WEIGHT: 221 [lb_av]

## 2016-11-21 ENCOUNTER — Telehealth: Payer: Self-pay | Admitting: Women's Health

## 2016-11-21 NOTE — Telephone Encounter (Signed)
Pt states she has had some lower abd pain X 2 days, denies any vaginal bleeding or pain with urination but does report she has had to strain with BM and had sex 2 days ago and has been having the pain since then.  Advised pt to push water, take Tylenol as needed, increase fiber in diet and monitor pain.  If pain becomes severe, starts having a flow of blood or passing clots [facility] or call us back.  Pt verbalized understanding.

## 2016-12-02 ENCOUNTER — Encounter: Payer: Self-pay | Admitting: Adult Health

## 2016-12-02 ENCOUNTER — Other Ambulatory Visit: Payer: Self-pay | Admitting: *Deleted

## 2016-12-02 ENCOUNTER — Other Ambulatory Visit: Payer: Medicaid Other

## 2016-12-02 ENCOUNTER — Ambulatory Visit (INDEPENDENT_AMBULATORY_CARE_PROVIDER_SITE_OTHER): Payer: Medicaid Other | Admitting: Adult Health

## 2016-12-02 VITALS — BP 126/80 | HR 87 | Wt 223.0 lb

## 2016-12-02 DIAGNOSIS — Z3492 Encounter for supervision of normal pregnancy, unspecified, second trimester: Secondary | ICD-10-CM

## 2016-12-02 DIAGNOSIS — Z3482 Encounter for supervision of other normal pregnancy, second trimester: Secondary | ICD-10-CM

## 2016-12-02 DIAGNOSIS — Z331 Pregnant state, incidental: Secondary | ICD-10-CM

## 2016-12-02 DIAGNOSIS — Z131 Encounter for screening for diabetes mellitus: Secondary | ICD-10-CM

## 2016-12-02 DIAGNOSIS — R8290 Unspecified abnormal findings in urine: Secondary | ICD-10-CM | POA: Diagnosis not present

## 2016-12-02 DIAGNOSIS — Z3A16 16 weeks gestation of pregnancy: Secondary | ICD-10-CM | POA: Diagnosis not present

## 2016-12-02 DIAGNOSIS — Z1389 Encounter for screening for other disorder: Secondary | ICD-10-CM

## 2016-12-02 LAB — POCT URINALYSIS DIPSTICK
Glucose, UA: 2
Ketones, UA: NEGATIVE
LEUKOCYTES UA: NEGATIVE
NITRITE UA: NEGATIVE
PROTEIN UA: NEGATIVE
RBC UA: NEGATIVE

## 2016-12-02 NOTE — Progress Notes (Signed)
G2P0010 4258w0d Estimated Date of Delivery: 05/19/17 Work In:has odor in urine Blood pressure 126/80, pulse 87, weight 223 lb (101.2 kg), last menstrual period 05/18/2016.   BP weight and urine results all reviewed and noted.  Please refer to the obstetrical flow sheet for the fundal height and fetal heart rate documentation:  Patient  denies any bleeding and no rupture of membranes symptoms or regular contractions. Patient is without complaints except odor in urine All questions were answered.PUUSH water water.  Orders Placed This Encounter  Procedures  . POCT urinalysis dipstick    Plan:  Continued routine obstetrical care,  Return as scheduled

## 2016-12-02 NOTE — Patient Instructions (Signed)
Second Trimester of Pregnancy The second trimester is from week 13 through week 28, month 4 through 6. This is often the time in pregnancy that you feel your best. Often times, morning sickness has lessened or quit. You may have more energy, and you may get hungry more often. Your unborn baby (fetus) is growing rapidly. At the end of the sixth month, he or she is about 9 inches long and weighs about 1 pounds. You will likely feel the baby move (quickening) between 18 and 20 weeks of pregnancy. Follow these instructions at home:  Avoid all smoking, herbs, and alcohol. Avoid drugs not approved by your doctor.  Do not use any tobacco products, including cigarettes, chewing tobacco, and electronic cigarettes. If you need help quitting, ask your doctor. You may get counseling or other support to help you quit.  Only take medicine as told by your doctor. Some medicines are safe and some are not during pregnancy.  Exercise only as told by your doctor. Stop exercising if you start having cramps.  Eat regular, healthy meals.  Wear a good support bra if your breasts are tender.  Do not use hot tubs, steam rooms, or saunas.  Wear your seat belt when driving.  Avoid raw meat, uncooked cheese, and liter boxes and soil used by cats.  Take your prenatal vitamins.  Take 1500-2000 milligrams of calcium daily starting at the 20th week of pregnancy until you deliver your baby.  Try taking medicine that helps you poop (stool softener) as needed, and if your doctor approves. Eat more fiber by eating fresh fruit, vegetables, and whole grains. Drink enough fluids to keep your pee (urine) clear or pale yellow.  Take warm water baths (sitz baths) to soothe pain or discomfort caused by hemorrhoids. Use hemorrhoid cream if your doctor approves.  If you have puffy, bulging veins (varicose veins), wear support hose. Raise (elevate) your feet for 15 minutes, 3-4 times a day. Limit salt in your diet.  Avoid heavy  lifting, wear low heals, and sit up straight.  Rest with your legs raised if you have leg cramps or low back pain.  Visit your dentist if you have not gone during your pregnancy. Use a soft toothbrush to brush your teeth. Be gentle when you floss.  You can have sex (intercourse) unless your doctor tells you not to.  Go to your doctor visits. Get help if:  You feel dizzy.  You have mild cramps or pressure in your lower belly (abdomen).  You have a nagging pain in your belly area.  You continue to feel sick to your stomach (nauseous), throw up (vomit), or have watery poop (diarrhea).  You have bad smelling fluid coming from your vagina.  You have pain with peeing (urination). Get help right away if:  You have a fever.  You are leaking fluid from your vagina.  You have spotting or bleeding from your vagina.  You have severe belly cramping or pain.  You lose or gain weight rapidly.  You have trouble catching your breath and have chest pain.  You notice sudden or extreme puffiness (swelling) of your face, hands, ankles, feet, or legs.  You have not felt the baby move in over an hour.  You have severe headaches that do not go away with medicine.  You have vision changes. This information is not intended to replace advice given to you by your health care provider. Make sure you discuss any questions you have with your health care   provider. Document Released: 12/10/2009 Document Revised: 02/21/2016 Document Reviewed: 11/16/2012 Elsevier Interactive Patient Education  2017 [employer]  

## 2016-12-03 ENCOUNTER — Other Ambulatory Visit: Payer: Self-pay | Admitting: Advanced Practice Midwife

## 2016-12-03 ENCOUNTER — Encounter: Payer: Self-pay | Admitting: Advanced Practice Midwife

## 2016-12-03 DIAGNOSIS — O24420 Gestational diabetes mellitus in childbirth, diet controlled: Secondary | ICD-10-CM

## 2016-12-03 DIAGNOSIS — O24410 Gestational diabetes mellitus in pregnancy, diet controlled: Secondary | ICD-10-CM

## 2016-12-03 DIAGNOSIS — O24419 Gestational diabetes mellitus in pregnancy, unspecified control: Secondary | ICD-10-CM | POA: Insufficient documentation

## 2016-12-03 LAB — GLUCOSE TOLERANCE, 2 HOURS W/ 1HR
GLUCOSE, 1 HOUR: 206 mg/dL — AB (ref 65–179)
GLUCOSE, FASTING: 110 mg/dL — AB (ref 65–91)
Glucose, 2 hour: 140 mg/dL (ref 65–152)

## 2016-12-03 NOTE — Progress Notes (Signed)
Pt sent mychart message re GDM, nutritional consult ordered

## 2016-12-09 ENCOUNTER — Encounter: Payer: Medicaid Other | Admitting: Advanced Practice Midwife

## 2016-12-15 ENCOUNTER — Encounter: Payer: Medicaid Other | Attending: Obstetrics and Gynecology

## 2016-12-15 DIAGNOSIS — Z3A00 Weeks of gestation of pregnancy not specified: Secondary | ICD-10-CM | POA: Diagnosis not present

## 2016-12-15 DIAGNOSIS — Z713 Dietary counseling and surveillance: Secondary | ICD-10-CM | POA: Diagnosis not present

## 2016-12-15 DIAGNOSIS — O24420 Gestational diabetes mellitus in childbirth, diet controlled: Secondary | ICD-10-CM | POA: Insufficient documentation

## 2016-12-16 ENCOUNTER — Telehealth: Payer: Self-pay | Admitting: *Deleted

## 2016-12-16 ENCOUNTER — Encounter: Payer: Self-pay | Admitting: *Deleted

## 2016-12-16 NOTE — Telephone Encounter (Signed)
Called dieticians office to check on referral. States they had not received it but will call the patient.

## 2016-12-16 NOTE — Telephone Encounter (Signed)
Accucheck meter,strips and Lancets [facility] per Genella RifeK. Booker, CNM

## 2016-12-16 NOTE — Progress Notes (Signed)
  Patient was seen on 12/15/16  for Gestational Diabetes self-management class at the [facility]. The following learning objectives were met by the patient during this course:   States the definition of Gestational Diabetes  States why dietary management is important in controlling blood glucose  Describes the effects each nutrient has on blood glucose levels  Demonstrates ability to create a balanced meal plan  Demonstrates carbohydrate counting   States when to check blood glucose levels  Demonstrates proper blood glucose monitoring techniques  States the effect of stress and exercise on blood glucose levels  States the importance of limiting caffeine and abstaining from alcohol and smoking  Blood glucose monitor given: yes Lot # [id number] Exp: 01/19/18 Blood glucose reading: 68  Patient instructed to monitor glucose levels: FBS: 60 - <90 1 hour: <140 2 hour: <120  *Patient received handouts:  Nutrition Diabetes and Pregnancy  Carbohydrate Counting List  Patient will be seen for follow-up as needed.

## 2016-12-16 NOTE — Telephone Encounter (Signed)
Patient called stating she needs Lancets and strips for her [facility] meter [facility]. She has been to the class.

## 2016-12-17 ENCOUNTER — Ambulatory Visit (INDEPENDENT_AMBULATORY_CARE_PROVIDER_SITE_OTHER): Payer: Medicaid Other | Admitting: Advanced Practice Midwife

## 2016-12-17 ENCOUNTER — Encounter: Payer: Self-pay | Admitting: Advanced Practice Midwife

## 2016-12-17 VITALS — BP 110/70 | HR 72 | Wt 224.0 lb

## 2016-12-17 DIAGNOSIS — O0992 Supervision of high risk pregnancy, unspecified, second trimester: Secondary | ICD-10-CM

## 2016-12-17 DIAGNOSIS — O24410 Gestational diabetes mellitus in pregnancy, diet controlled: Secondary | ICD-10-CM

## 2016-12-17 DIAGNOSIS — Z363 Encounter for antenatal screening for malformations: Secondary | ICD-10-CM

## 2016-12-17 DIAGNOSIS — Z3682 Encounter for antenatal screening for nuchal translucency: Secondary | ICD-10-CM | POA: Diagnosis not present

## 2016-12-17 DIAGNOSIS — Z1389 Encounter for screening for other disorder: Secondary | ICD-10-CM

## 2016-12-17 DIAGNOSIS — Z331 Pregnant state, incidental: Secondary | ICD-10-CM

## 2016-12-17 LAB — POCT URINALYSIS DIPSTICK
Blood, UA: NEGATIVE
GLUCOSE UA: NEGATIVE
Ketones, UA: NEGATIVE
LEUKOCYTES UA: NEGATIVE
NITRITE UA: NEGATIVE

## 2016-12-17 MED ORDER — ASPIRIN EC 81 MG PO TBEC: 81.0000 mg | DELAYED_RELEASE_TABLET | Freq: Every day | ORAL | 6 refills | Status: DC

## 2016-12-17 MED ORDER — GLYBURIDE 2.5 MG PO TABS: 2.5000 mg | ORAL_TABLET | Freq: Two times a day (BID) | ORAL | 3 refills | Status: DC

## 2016-12-17 NOTE — Patient Instructions (Addendum)
Second Trimester of Pregnancy The second trimester is from week 14 through week 27 (months 4 through 6). The second trimester is often a time when you feel your best. Your body has adjusted to being pregnant, and you begin to feel better physically. Usually, morning sickness has lessened or quit completely, you may have more energy, and you may have an increase in appetite. The second trimester is also a time when the fetus is growing rapidly. At the end of the sixth month, the fetus is about 9 inches long and weighs about 1 pounds. You will likely begin to feel the baby move (quickening) between 16 and 20 weeks of pregnancy. Body changes during your second trimester Your body continues to go through many changes during your second trimester. The changes vary from woman to woman.  Your weight will continue to increase. You will notice your lower abdomen bulging out.  You may begin to get stretch marks on your hips, abdomen, and breasts.  You may develop headaches that can be relieved by medicines. The medicines should be approved by your health care provider.  You may urinate more often because the fetus is pressing on your bladder.  You may develop or continue to have heartburn as a result of your pregnancy.  You may develop constipation because certain hormones are causing the muscles that push waste through your intestines to slow down.  You may develop hemorrhoids or swollen, bulging veins (varicose veins).  You may have back pain. This is caused by:  Weight gain.  Pregnancy hormones that are relaxing the joints in your pelvis.  A shift in weight and the muscles that support your balance.  Your breasts will continue to grow and they will continue to become tender.  Your gums may bleed and may be sensitive to brushing and flossing.  Dark spots or blotches (chloasma, mask of pregnancy) may develop on your face. This will likely fade after the baby is born.  A dark line from your  belly button to the pubic area (linea nigra) may appear. This will likely fade after the baby is born.  You may have changes in your hair. These can include thickening of your hair, rapid growth, and changes in texture. Some women also have hair loss during or after pregnancy, or hair that feels dry or thin. Your hair will most likely return to normal after your baby is born. What to expect at prenatal visits During a routine prenatal visit:  You will be weighed to make sure you and the fetus are growing normally.  Your blood pressure will be taken.  Your abdomen will be measured to track your baby's growth.  The fetal heartbeat will be listened to.  Any test results from the previous visit will be discussed. Your health care provider may ask you:  How you are feeling.  If you are feeling the baby move.  If you have had any abnormal symptoms, such as leaking fluid, bleeding, severe headaches, or abdominal cramping.  If you are using any tobacco products, including cigarettes, chewing tobacco, and electronic cigarettes.  If you have any questions. Other tests that may be performed during your second trimester include:  Blood tests that check for:  Low iron levels (anemia).  High blood sugar that affects pregnant women (gestational diabetes) between 24 and 28 weeks.  Rh antibodies. This is to check for a protein on red blood cells (Rh factor).  Urine tests to check for infections, diabetes, or protein in the   urine.  An ultrasound to confirm the proper growth and development of the baby.  An amniocentesis to check for possible genetic problems.  Fetal screens for spina bifida and Down syndrome.  HIV (human immunodeficiency virus) testing. Routine prenatal testing includes screening for HIV, unless you choose not to have this test. Follow these instructions at home: Medicines   Follow your health care provider's instructions regarding medicine use. Specific medicines may  be either safe or unsafe to take during pregnancy.  Take a prenatal vitamin that contains at least 600 micrograms (mcg) of folic acid.  If you develop constipation, try taking a stool softener if your health care provider approves. Eating and drinking   Eat a balanced diet that includes fresh fruits and vegetables, whole grains, good sources of protein such as meat, eggs, or tofu, and low-fat dairy. Your health care provider will help you determine the amount of weight gain that is right for you.  Avoid raw meat and uncooked cheese. These carry germs that can cause birth defects in the baby.  If you have low calcium intake from food, talk to your health care provider about whether you should take a daily calcium supplement.  Limit foods that are high in fat and processed sugars, such as fried and sweet foods.  To prevent constipation:  Drink enough fluid to keep your urine clear or pale yellow.  Eat foods that are high in fiber, such as fresh fruits and vegetables, whole grains, and beans. Activity   Exercise only as directed by your health care provider. Most women can continue their usual exercise routine during pregnancy. Try to exercise for 30 minutes at least 5 days a week. Stop exercising if you experience uterine contractions.  Avoid heavy lifting, wear low heel shoes, and practice good posture.  A sexual relationship may be continued unless your health care provider directs you otherwise. Relieving pain and discomfort   Wear a good support bra to prevent discomfort from breast tenderness.  Take warm sitz baths to soothe any pain or discomfort caused by hemorrhoids. Use hemorrhoid cream if your health care provider approves.  Rest with your legs elevated if you have leg cramps or low back pain.  If you develop varicose veins, wear support hose. Elevate your feet for 15 minutes, 3-4 times a day. Limit salt in your diet. Prenatal Care   Write down your questions. Take them  to your prenatal visits.  Keep all your prenatal visits as told by your health care provider. This is important. Safety   Wear your seat belt at all times when driving.  Make a list of emergency phone numbers, including numbers for family, friends, the hospital, and police and fire departments. General instructions   Ask your health care provider for a referral to a local prenatal education class. Begin classes no later than the beginning of month 6 of your pregnancy.  Ask for help if you have counseling or nutritional needs during pregnancy. Your health care provider can offer advice or refer you to specialists for help with various needs.  Do not use hot tubs, steam rooms, or saunas.  Do not douche or use tampons or scented sanitary pads.  Do not cross your legs for long periods of time.  Avoid cat litter boxes and soil used by cats. These carry germs that can cause birth defects in the baby and possibly loss of the fetus by miscarriage or stillbirth.  Avoid all smoking, herbs, alcohol, and unprescribed drugs. Chemicals in   these products can affect the formation and growth of the baby.  Do not use any products that contain nicotine or tobacco, such as cigarettes and e-cigarettes. If you need help quitting, ask your health care provider.  Visit your dentist if you have not gone yet during your pregnancy. Use a soft toothbrush to brush your teeth and be gentle when you floss. Contact a health care provider if:  You have dizziness.  You have mild pelvic cramps, pelvic pressure, or nagging pain in the abdominal area.  You have persistent nausea, vomiting, or diarrhea.  You have a bad smelling vaginal discharge.  You have pain when you urinate. Get help right away if:  You have a fever.  You are leaking fluid from your vagina.  You have spotting or bleeding from your vagina.  You have severe abdominal cramping or pain.  You have rapid weight gain or weight loss.  You  have shortness of breath with chest pain.  You notice sudden or extreme swelling of your face, hands, ankles, feet, or legs.  You have not felt your baby move in over an hour.  You have severe headaches that do not go away when you take medicine.  You have vision changes. Summary  The second trimester is from week 14 through week 27 (months 4 through 6). It is also a time when the fetus is growing rapidly.  Your body goes through many changes during pregnancy. The changes vary from woman to woman.  Avoid all smoking, herbs, alcohol, and unprescribed drugs. These chemicals affect the formation and growth your baby.  Do not use any tobacco products, such as cigarettes, chewing tobacco, and e-cigarettes. If you need help quitting, ask your health care provider.  Contact your health care provider if you have any questions. Keep all prenatal visits as told by your health care provider. This is important. This information is not intended to replace advice given to you by your health care provider. Make sure you discuss any questions you have with your health care provider. Document Released: 09/09/2001 Document Revised: 02/21/2016 Document Reviewed: 11/16/2012 Elsevier Interactive Patient Education  2017 [employer]. [address] trimestre de Psychiatrist (Second Trimester of Pregnancy) El segundo trimestre va desde la semana13 hasta la 28, desde el cuarto hasta el sexto mes, y suele ser el momento en el que mejor se siente. Su organismo se ha adaptado a Charity fundraiser y comienza a Diplomatic Services operational officer. En general, las nuseas matutinas han disminuido o han desaparecido completamente, puede tener ms energa y un aumento de apetito. El segundo trimestre es tambin la poca en la que el feto se desarrolla rpidamente. Hacia el final del sexto mes, el feto mide aproximadamente 9pulgadas (23cm) y pesa alrededor de 1 libras (700g). Es probable que sienta que el beb se Teacher, English as a foreign language (da pataditas) entre  las 18 y 20semanas del Psychiatrist. CAMBIOS EN EL ORGANISMO Su organismo atraviesa por muchos cambios durante el [place], y estos varan de Neomia Dear mujer a Educational psychologist.  Seguir [employer]. Notar que la parte baja del abdomen sobresale.  Podrn aparecer las primeras [employer] caderas, el abdomen y las [place].  Es posible que tenga dolores de cabeza que pueden aliviarse con los medicamentos que el mdico le permita tomar.  Tal vez tenga necesidad de orinar con ms frecuencia porque el feto est ejerciendo presin Ambulance person.  Debido al Vanetta Mulders podr sentir Anthoney Harada estomacal con frecuencia.  Puede estar estreida, ya que ciertas hormonas enlentecen los movimientos de  los msculos que [employer] desechos a travs de los intestinos.  Pueden aparecer hemorroides o abultarse e hincharse las venas (venas varicosas).  Puede tener dolor de espalda que se debe al [employer] de peso y a que las hormonas del Management consultant las articulaciones entre los huesos de la pelvis, y Public librarian consecuencia de la modificacin del peso y los msculos que mantienen el equilibrio.  Las [employer] seguirn creciendo y Development worker, community.  Las Veterinary surgeon y estar sensibles al cepillado y al hilo dental.  Pueden aparecer zonas oscuras o manchas (cloasma, mscara del Psychiatrist) en el rostro que probablemente se atenuar despus del nacimiento del beb.  Es posible que se forme una lnea oscura desde el ombligo hasta la zona del pubis (linea nigra) que probablemente se atenuar despus del nacimiento del beb.  Tal vez haya cambios en el cabello que pueden incluir su engrosamiento, crecimiento rpido y cambios en la textura. Adems, a algunas mujeres se les cae el cabello durante o despus del embarazo, o tienen el cabello seco o fino. Lo ms probable es que el cabello se le normalice despus del nacimiento del beb. QU DEBE ESPERAR EN LAS CONSULTAS PRENATALES Durante una visita prenatal de rutina:  La pesarn para  asegurarse de que usted y el feto estn creciendo normalmente.  Le tomarn la presin arterial.  Le medirn el abdomen para controlar el desarrollo del beb.  Se escucharn los latidos cardacos fetales.  Se evaluarn los resultados de los estudios solicitados en visitas anteriores. El mdico puede preguntarle lo siguiente:  Cmo se siente.  Si siente los movimientos del beb.  Si ha tenido sntomas anormales, como prdida de lquido, [place], dolores de cabeza intensos o clicos abdominales.  Si est consumiendo algn producto que contenga tabaco, como cigarrillos, tabaco de Theatre manager y Administrator, Civil Service.  Si tiene [employer]. Otros estudios que podrn realizarse durante el segundo trimestre incluyen lo siguiente:  Anlisis de sangre para detectar lo siguiente:  Concentraciones de hierro bajas (anemia).  Diabetes gestacional (entre la semana 24 y la 28).  Anticuerpos Rh.  Anlisis de orina para detectar infecciones, diabetes o protenas en la orina.  Una ecografa para confirmar que el beb crece y se desarrolla correctamente.  Una amniocentesis para diagnosticar posibles problemas genticos.  Estudios del feto para descartar espina bfida y sndrome de Down.  Prueba del VIH (virus de inmunodeficiencia humana). Los exmenes prenatales de rutina incluyen la prueba de deteccin del VIH, a menos que decida no Futures trader. INSTRUCCIONES PARA EL CUIDADO EN EL HOGAR  Evite fumar, consumir hierbas, beber alcohol y tomar frmacos que no le hayan recetado. Estas sustancias qumicas afectan la formacin y el desarrollo del beb.  No consuma ningn producto que contenga tabaco, lo que incluye cigarrillos, tabaco de Theatre manager y Administrator, Civil Service. Si necesita ayuda para dejar de fumar, consulte al [employer]. Puede recibir asesoramiento y otro tipo de recursos para dejar de fumar.  Siga las indicaciones del mdico en relacin con el uso de medicamentos. Durante el  embarazo, hay medicamentos que son seguros de tomar y otros que no.  Haga ejercicio solamente como se lo haya indicado el mdico. Sentir clicos uterinos es un buen signo para Restaurant manager, fast food actividad fsica.  Contine comiendo alimentos sanos con regularidad.  Use un sostn que le brinde buen soporte si le [employer].  No se d baos de inmersin en agua caliente, baos turcos ni saunas.  Use el cinturn de seguridad en todo momento mientras conduce.  No  coma carne cruda ni queso sin cocinar; evite el contacto con las bandejas sanitarias de los gatos y la tierra que estos animales usan. Estos elementos contienen grmenes que pueden causar defectos congnitos en el beb.  Tome las vitaminas prenatales.  Tome entre 1500 y 2000mg  de calcio diariamente comenzando en la semana20 del embarazo [place] el parto.  Si est estreida, pruebe un laxante suave (si el mdico lo autoriza). Consuma ms alimentos ricos en fibra, como vegetales y frutas frescos y Radiation protection practitionercereales integrales. Beba gran cantidad de lquido para mantener la orina de tono claro o color amarillo plido.  Dese baos de asiento con agua tibia para Engineer, materialsaliviar el dolor o las molestias causadas por las hemorroides. Use una crema para las hemorroides si el mdico la autoriza.  Si tiene venas varicosas, use medias de descanso. Eleve los pies durante 15minutos, 3 o 4veces por da. Limite el consumo de sal en su dieta.  No levante objetos pesados, use zapatos de tacones bajos y [address] Boulevardmantenga una buena postura.  Descanse con las piernas elevadas si tiene calambres o dolor de cintura.  Visite a su dentista si an no lo ha Occupational hygienisthecho durante el embarazo. Use un cepillo de dientes blando para higienizarse los dientes y psese el hilo dental con suavidad.  Puede seguir Calpine Corporationmanteniendo relaciones sexuales, a menos que el mdico le indique lo contrario.  Concurra a todas las visitas prenatales segn las indicaciones de su mdico. SOLICITE ATENCIN MDICA  SI:  Santa Generaiene mareos.  Siente clicos leves, presin en la pelvis o dolor persistente en el abdomen.  Tiene nuseas, vmitos o diarrea persistentes.  Brett Fairybserva una secrecin vaginal con mal olor.  Siente dolor al ConocoPhillipsorinar. SOLICITE ATENCIN MDICA DE INMEDIATO SI:  Tiene fiebre.  Tiene una prdida de lquido por la vagina.  Tiene sangrado o pequeas prdidas vaginales.  Siente dolor intenso o clicos en el abdomen.  Sube o baja de peso rpidamente.  Tiene dificultad para respirar y siente dolor de pecho.  Sbitamente se le hinchan mucho el rostro, las Trempealeaumanos, los tobillos, los pies o las piernas.  No ha sentido los movimientos del beb durante Georgianne Fickuna hora.  Siente un dolor de cabeza intenso que no se alivia con medicamentos.  Su visin se modifica. Esta informacin no tiene Theme park managercomo fin reemplazar el consejo del mdico. Asegrese de hacerle al mdico cualquier pregunta que tenga. Document Released: 06/25/2005 Document Revised: 10/06/2014 Document Reviewed: 11/16/2012 Elsevier Interactive Patient Education  2017 [employer]

## 2016-12-17 NOTE — Progress Notes (Signed)
Fetal Surveillance Testing today:  doppler   High Risk Pregnancy Diagnosis(es):   A1/B DM  G2P0010 8554w1d Estimated Date of Delivery: 05/19/17  Blood pressure 110/70, pulse 72, weight 224 lb (101.6 kg), last menstrual period 05/18/2016.  Urinalysis: Positive for trace protein   HPI: The patient is being seen today for ongoing management of the above.  Went to nutrition class 3/19. Today she reports FBS 129, 104 and 2 hr pp 114, 104, 135,132   BP weight and urine results all reviewed and noted. Patient reports good fetal movement, denies any bleeding and no rupture of membranes symptoms or regular contractions.   Fetal Heart rate:  150 Edema:  no  Patient is without complaints other than noted in her HPI. All questions were answered.  All lab and sonogram results have been reviewed. Comments:  >1/2 Blood sugars out of range, all of the fastings  Assessment:  1.  Pregnancy at 4054w1d,  Estimated Date of Delivery: 05/19/17 :  A1/B DM, now A2                        2.                          3.    Medication(s) Plans:  ASA 81mg  daily; glyburide 2.5mg  BID  Treatment Plan:  Continue QID blood sugar testing, EFW 36-38 weeks, IOL 40 weeks   Return in about 1 week (around 12/24/2016) for HROB, ZO:[id number]:Anatomy. for appointment for high risk OB care to go over blood sugars  Meds ordered this encounter  Medications  . aspirin EC 81 MG tablet    Sig: Take 1 tablet (81 mg total) by mouth daily.    Dispense:  30 tablet    Refill:  6    Order Specific Question:   Supervising Provider    Answer:   Despina HiddenEURE, LUTHER H [[id number]]  . glyBURIDE (DIABETA) 2.5 MG tablet    Sig: Take 1 tablet (2.5 mg total) by mouth 2 (two) times daily with a meal.    Dispense:  60 tablet    Refill:  3    Order Specific Question:   Supervising Provider    Answer:   Duane LopeEURE, LUTHER H [[id number]]   Orders Placed This Encounter  Procedures  . US OB Comp + 14 Wk  . Maternal Screen, Integrated #2  . POCT urinalysis dipstick

## 2016-12-23 LAB — MATERNAL SCREEN, INTEGRATED #2
AFP MOM: 0.89
Alpha-Fetoprotein: 28.1 ng/mL
Crown Rump Length: 76.7 mm
DIA MOM: 1.08
DIA Value: 158.6 pg/mL
ESTRIOL UNCONJUGATED: 1.19 ng/mL
Gest. Age on Collection Date: 13.3 weeks
Gestational Age: 18.4 weeks
HCG MOM: 1.64
Maternal Age at EDD: 23.3 years
NUCHAL TRANSLUCENCY MOM: 0.85
NUMBER OF FETUSES: 1
Nuchal Translucency (NT): 1.6 mm
PAPP-A MoM: 0.35
PAPP-A Value: 275.4 ng/mL
TEST RESULTS: NEGATIVE
Weight: 221 [lb_av]
Weight: 224 [lb_av]
hCG Value: 24.1 IU/mL
uE3 MoM: 0.98

## 2016-12-29 ENCOUNTER — Ambulatory Visit (INDEPENDENT_AMBULATORY_CARE_PROVIDER_SITE_OTHER): Payer: Medicaid Other | Admitting: Obstetrics & Gynecology

## 2016-12-29 ENCOUNTER — Ambulatory Visit (INDEPENDENT_AMBULATORY_CARE_PROVIDER_SITE_OTHER): Payer: Medicaid Other

## 2016-12-29 ENCOUNTER — Encounter: Payer: Self-pay | Admitting: Obstetrics & Gynecology

## 2016-12-29 VITALS — BP 118/70 | HR 87 | Wt 228.0 lb

## 2016-12-29 DIAGNOSIS — Z3482 Encounter for supervision of other normal pregnancy, second trimester: Secondary | ICD-10-CM

## 2016-12-29 DIAGNOSIS — Z1389 Encounter for screening for other disorder: Secondary | ICD-10-CM | POA: Diagnosis not present

## 2016-12-29 DIAGNOSIS — O24419 Gestational diabetes mellitus in pregnancy, unspecified control: Secondary | ICD-10-CM

## 2016-12-29 DIAGNOSIS — Z363 Encounter for antenatal screening for malformations: Secondary | ICD-10-CM

## 2016-12-29 DIAGNOSIS — Z331 Pregnant state, incidental: Secondary | ICD-10-CM | POA: Diagnosis not present

## 2016-12-29 LAB — POCT URINALYSIS DIPSTICK
Glucose, UA: NEGATIVE
Ketones, UA: NEGATIVE
Leukocytes, UA: NEGATIVE
NITRITE UA: NEGATIVE
PROTEIN UA: NEGATIVE
RBC UA: NEGATIVE

## 2016-12-29 NOTE — Progress Notes (Signed)
Fetal Surveillance Testing today:  Sonogram is normal   High Risk Pregnancy Diagnosis(es):   Class A2/B DM  G2P0010 [redacted]w[redacted]d Estimated Date of Delivery: 05/19/17  Blood pressure 118/70, pulse 87, weight 228 lb (103.4 kg), last menstrual period 05/18/2016.  Urinalysis: Negative   HPI: The patient is being seen today for ongoing management of as above. Today she reports CBG are pretty good   BP weight and urine results all reviewed and noted. Patient reports good fetal movement, denies any bleeding and no rupture of membranes symptoms or regular contractions.  Fundal Height:  22 Fetal Heart rate:  145 Edema:  none  Patient is without complaints other than noted in her HPI. All questions were answered.  All lab and sonogram results have been reviewed. Comments:    Assessment:  1.  Pregnancy at [redacted]w[redacted]d,  Estimated Date of Delivery: 05/19/17 :                          2.  Class A2/B DM                        3.    Medication(s) Plans:  Glyburide 2.5 mg BID  Treatment Plan:  Per protocol  Return in about 4 weeks (around 01/26/2017) for HROB. for appointment for high risk OB care  No orders of the defined types were placed in this encounter.  Orders Placed This Encounter  Procedures  . POCT urinalysis dipstick

## 2016-12-29 NOTE — Progress Notes (Addendum)
[place] 19+6 wks,breech,cx 3.6 cm,ant pl gr 0,svp of fluid 5.5 cm,fhr 150 bpm,normal ovaries bilat,efw 319 g,limited view of spine because of fetal position,please have pt come back for additional images,no obvious abnormalities seen

## 2017-01-15 ENCOUNTER — Telehealth: Payer: Self-pay | Admitting: *Deleted

## 2017-01-15 ENCOUNTER — Encounter (HOSPITAL_COMMUNITY): Payer: Self-pay | Admitting: *Deleted

## 2017-01-15 ENCOUNTER — Inpatient Hospital Stay (HOSPITAL_COMMUNITY)
Admission: AD | Admit: 2017-01-15 | Discharge: 2017-01-15 | Disposition: A | Discharge status: Home / Self Care | Payer: Medicaid Other | Source: Ambulatory Visit | Attending: Obstetrics and Gynecology | Admitting: Obstetrics and Gynecology

## 2017-01-15 DIAGNOSIS — O24415 Gestational diabetes mellitus in pregnancy, controlled by oral hypoglycemic drugs: Secondary | ICD-10-CM | POA: Insufficient documentation

## 2017-01-15 DIAGNOSIS — K529 Noninfective gastroenteritis and colitis, unspecified: Secondary | ICD-10-CM | POA: Insufficient documentation

## 2017-01-15 DIAGNOSIS — O368120 Decreased fetal movements, second trimester, not applicable or unspecified: Secondary | ICD-10-CM | POA: Insufficient documentation

## 2017-01-15 DIAGNOSIS — Z880 Allergy status to penicillin: Secondary | ICD-10-CM | POA: Insufficient documentation

## 2017-01-15 DIAGNOSIS — Z3A22 22 weeks gestation of pregnancy: Secondary | ICD-10-CM | POA: Insufficient documentation

## 2017-01-15 DIAGNOSIS — O9989 Other specified diseases and conditions complicating pregnancy, childbirth and the puerperium: Secondary | ICD-10-CM | POA: Diagnosis not present

## 2017-01-15 DIAGNOSIS — O26892 Other specified pregnancy related conditions, second trimester: Secondary | ICD-10-CM | POA: Insufficient documentation

## 2017-01-15 LAB — URINALYSIS, ROUTINE W REFLEX MICROSCOPIC
BILIRUBIN URINE: NEGATIVE
GLUCOSE, UA: NEGATIVE mg/dL
HGB URINE DIPSTICK: NEGATIVE
KETONES UR: NEGATIVE mg/dL
Leukocytes, UA: NEGATIVE
Nitrite: NEGATIVE
PROTEIN: NEGATIVE mg/dL
Specific Gravity, Urine: 1.021 (ref 1.005–1.030)
pH: 6 (ref 5.0–8.0)

## 2017-01-15 NOTE — Discharge Instructions (Signed)
Food Choices to Help Relieve Diarrhea, Adult When you have diarrhea, the foods you eat and your eating habits are very important. Choosing the right foods and drinks can help:  Relieve diarrhea.  Replace lost fluids and nutrients.  Prevent dehydration.  What general guidelines should I follow? Relieving diarrhea  Choose foods with less than 2 g or .07 oz. of fiber per serving.  Limit fats to less than 8 tsp (38 g or 1.34 oz.) a day.  Avoid the following: ? Foods and beverages sweetened with high-fructose corn syrup, honey, or sugar alcohols such as xylitol, sorbitol, and mannitol. ? Foods that contain a lot of fat or sugar. ? Fried, greasy, or spicy foods. ? High-fiber grains, breads, and cereals. ? Raw fruits and vegetables.  Eat foods that are rich in probiotics. These foods include dairy products such as yogurt and fermented milk products. They help increase healthy bacteria in the stomach and intestines (gastrointestinal tract, or GI tract).  If you have lactose intolerance, avoid dairy products. These may make your diarrhea worse.  Take medicine to help stop diarrhea (antidiarrheal medicine) only as told by your health care provider. Replacing nutrients  Eat small meals or snacks every 3-4 hours.  Eat bland foods, such as white rice, toast, or baked potato, until your diarrhea starts to get better. Gradually reintroduce nutrient-rich foods as tolerated or as told by your health care provider. This includes: ? Well-cooked protein foods. ? Peeled, seeded, and soft-cooked fruits and vegetables. ? Low-fat dairy products.  Take vitamin and mineral supplements as told by your health care provider. Preventing dehydration   Start by sipping water or a special solution to prevent dehydration (oral rehydration solution, ORS). Urine that is clear or pale yellow means that you are getting enough fluid.  Try to drink at least 8-10 cups of fluid each day to help replace lost  fluids.  You may add other liquids in addition to water, such as clear juice or decaffeinated sports drinks, as tolerated or as told by your health care provider.  Avoid drinks with caffeine, such as coffee, tea, or soft drinks.  Avoid alcohol. What foods are recommended? The items listed may not be a complete list. Talk with your health care provider about what dietary choices are best for you. Grains White rice. White, French, or pita breads (fresh or toasted), including plain rolls, buns, or bagels. White pasta. Saltine, soda, or graham crackers. Pretzels. Low-fiber cereal. Cooked cereals made with water (such as cornmeal, farina, or cream cereals). Plain muffins. Matzo. Melba toast. Zwieback. Vegetables Potatoes (without the skin). Most well-cooked and canned vegetables without skins or seeds. Tender lettuce. Fruits Apple sauce. Fruits canned in juice. Cooked apricots, cherries, grapefruit, peaches, pears, or plums. Fresh bananas and cantaloupe. Meats and other protein foods Baked or boiled chicken. Eggs. Tofu. Fish. Seafood. Smooth nut butters. Ground or well-cooked tender beef, ham, veal, lamb, pork, or poultry. Dairy Plain yogurt, kefir, and unsweetened liquid yogurt. Lactose-free milk, buttermilk, skim milk, or soy milk. Low-fat or nonfat hard cheese. Beverages Water. Low-calorie sports drinks. Fruit juices without pulp. Strained tomato and vegetable juices. Decaffeinated teas. Sugar-free beverages not sweetened with sugar alcohols. Oral rehydration solutions, if approved by your health care provider. Seasoning and other foods Bouillon, broth, or soups made from recommended foods. What foods are not recommended? The items listed may not be a complete list. Talk with your health care provider about what dietary choices are best for you. Grains Whole grain, whole wheat,   bran, or rye breads, rolls, pastas, and crackers. Wild or brown rice. Whole grain or bran cereals. Barley. Oats and  oatmeal. Corn tortillas or taco shells. Granola. Popcorn. Vegetables Raw vegetables. Fried vegetables. Cabbage, broccoli, Brussels sprouts, artichokes, baked beans, beet greens, corn, kale, legumes, peas, sweet potatoes, and yams. Potato skins. Cooked spinach and cabbage. Fruits Dried fruit, including raisins and dates. Raw fruits. Stewed or dried prunes. Canned fruits with syrup. Meat and other protein foods Fried or fatty meats. Deli meats. Chunky nut butters. Nuts and seeds. Beans and lentils. Bacon. Hot dogs. Sausage. Dairy High-fat cheeses. Whole milk, chocolate milk, and beverages made with milk, such as milk shakes. Half-and-half. Cream. sour cream. Ice cream. Beverages Caffeinated beverages (such as coffee, tea, soda, or energy drinks). Alcoholic beverages. Fruit juices with pulp. Prune juice. Soft drinks sweetened with high-fructose corn syrup or sugar alcohols. High-calorie sports drinks. Fats and oils Butter. Cream sauces. Margarine. Salad oils. Plain salad dressings. Olives. Avocados. Mayonnaise. Sweets and desserts Sweet rolls, doughnuts, and sweet breads. Sugar-free desserts sweetened with sugar alcohols such as xylitol and sorbitol. Seasoning and other foods Honey. Hot sauce. Chili powder. Gravy. Cream-based or milk-based soups. Pancakes and waffles. Summary  When you have diarrhea, the foods you eat and your eating habits are very important.  Make sure you get at least 8-10 cups of fluid each day, or enough to keep your urine clear or pale yellow.  Eat bland foods and gradually reintroduce healthy, nutrient-rich foods as tolerated, or as told by your health care provider.  Avoid high-fiber, fried, greasy, or spicy foods. This information is not intended to replace advice given to you by your health care provider. Make sure you discuss any questions you have with your health care provider. Document Released: 12/06/2003 Document Revised: 09/12/2016 Document Reviewed:  09/12/2016 Elsevier Interactive Patient Education  2017 Elsevier Inc.  

## 2017-01-15 NOTE — Telephone Encounter (Signed)
Patient called stating she has not felt fetal movement since Wednesday morning. She took her "diabetic meds" and since then has not felt movement. Advised patient to go to [facility] for evaluation. Pt verbalized understanding.

## 2017-01-15 NOTE — MAU Provider Note (Signed)
History     CSN: [id number]  Arrival date and time: 01/15/17 1445   First Provider Initiated Contact with Patient 01/15/17 1527      Chief Complaint  Patient presents with  . Decreased Fetal Movement   HPI Ms. Shamir Sedlar is a 21 y.o. G2P0010 at [redacted]w[redacted]d who presents to MAU today with complaint of decreased fetal movement since yesterday and GI upset. The patient states 2 weeks of loose stools and a few days of N/V, although none today. She denies vaginal bleeding, LOF today. She reports no fetal movement noted since yesterday. She is currently on Glyburide for GDM. She receives her [facility] at [facility].    OB History    Gravida Para Term Preterm AB Living   2       1     SAB TAB Ectopic Multiple Live Births   1              Past Medical History:  Diagnosis Date  . Medical history non-contributory     Past Surgical History:  Procedure Laterality Date  . DILATION AND CURETTAGE OF UTERUS N/A 08/01/2016   Procedure: SUCTION DILATATION AND CURETTAGE;  Surgeon: Tilda Burrow, MD;  Location: [facility];  Service: Gynecology;  Laterality: N/A;  . NO PAST SURGERIES      Family History  Problem Relation Age of Onset  . Diabetes Maternal Grandmother   . Kidney failure Maternal Grandmother   . Diabetes Mother   . Hypertension Mother   . Kidney failure Mother   . Miscarriages / [place] Mother   . Scoliosis Brother   . Diabetes Sister   . Miscarriages / Stillbirths Sister   . Diabetes Sister   . Miscarriages / [place] Sister     Social History  Substance Use Topics  . Smoking status: Never Smoker  . Smokeless tobacco: Never Used  . Alcohol use No    Allergies:  Allergies  Allergen Reactions  . Penicillins Anaphylaxis  . Mango Flavor Hives and Swelling  . Peanut-Containing Drug Products   . Latex Rash    No prescriptions prior to admission.    Review of Systems  Constitutional: Negative for fever.  Gastrointestinal: Positive for nausea and  vomiting. Negative for abdominal pain, constipation and diarrhea.  Genitourinary: Negative for dysuria, frequency, urgency, vaginal bleeding and vaginal discharge.   Physical Exam   Blood pressure (!) 118/56, pulse 95, temperature 98.5 F (36.9 C), temperature source Oral, resp. rate 18, height 4' 11.5" (1.511 m), weight 232 lb (105.2 kg), last menstrual period 05/18/2016.  Physical Exam  Nursing note and vitals reviewed. Constitutional: She is oriented to person, place, and time. She appears well-developed and well-nourished. No distress.  HENT:  Head: Normocephalic and atraumatic.  Cardiovascular: Normal rate.   Respiratory: Effort normal.  GI: Soft. She exhibits no distension and no mass. There is no tenderness. There is no rebound and no guarding.  Neurological: She is alert and oriented to person, place, and time.  Skin: Skin is warm and dry. No erythema.  Psychiatric: She has a normal mood and affect.  Dilation: Closed Effacement (%): Thick Cervical Position: Posterior Exam by:: Janne Faulk,PA   Results for orders placed or performed during the hospital encounter of 01/15/17 (from the past 24 hour(s))  Urinalysis, Routine w reflex microscopic     Status: Abnormal   Collection Time: 01/15/17  2:50 PM  Result Value Ref Range   Color, Urine YELLOW YELLOW   APPearance CLOUDY (A)  CLEAR   Specific Gravity, Urine 1.021 1.005 - 1.030   pH 6.0 5.0 - 8.0   Glucose, UA NEGATIVE NEGATIVE mg/dL   Hgb urine dipstick NEGATIVE NEGATIVE   Bilirubin Urine NEGATIVE NEGATIVE   Ketones, ur NEGATIVE NEGATIVE mg/dL   Protein, ur NEGATIVE NEGATIVE mg/dL   Nitrite NEGATIVE NEGATIVE   Leukocytes, UA NEGATIVE NEGATIVE    MAU Course  Procedures None  MDM FHR - 165 bpm with doppler UA today without evidence of dehydration   Assessment and Plan  A: SIUP at [redacted]w[redacted]d GDM Gastroenteritis   P: Discharge home OTC Immodium advised for loose stools PRN Second trimester precautions and BRAT diet  discussed Patient advised to follow-up with [facility] as scheduled for routine prenatal care or sooner PRN Patient may return to MAU as needed or if her condition were to change or worsen  Marny Lowenstein, PA-C  01/15/2017, 7:22 PM

## 2017-01-15 NOTE — MAU Note (Signed)
Pt is on metformin for DM> Stopped taking it for 2 days due to Nausea and diarrhea . Started taking it again today. Has not felt baby move since yesterday. Pt worried. C/O some pelvic pressure.

## 2017-01-30 ENCOUNTER — Encounter: Payer: Self-pay | Admitting: Obstetrics & Gynecology

## 2017-01-30 ENCOUNTER — Ambulatory Visit (INDEPENDENT_AMBULATORY_CARE_PROVIDER_SITE_OTHER): Payer: Medicaid Other | Admitting: Obstetrics & Gynecology

## 2017-01-30 VITALS — BP 122/74 | HR 92 | Temp 98.0°F | Wt 236.0 lb

## 2017-01-30 DIAGNOSIS — O0992 Supervision of high risk pregnancy, unspecified, second trimester: Secondary | ICD-10-CM | POA: Diagnosis not present

## 2017-01-30 DIAGNOSIS — Z331 Pregnant state, incidental: Secondary | ICD-10-CM | POA: Diagnosis not present

## 2017-01-30 DIAGNOSIS — O24419 Gestational diabetes mellitus in pregnancy, unspecified control: Secondary | ICD-10-CM

## 2017-01-30 DIAGNOSIS — Z1389 Encounter for screening for other disorder: Secondary | ICD-10-CM

## 2017-01-30 LAB — POCT URINALYSIS DIPSTICK
Glucose, UA: NEGATIVE
Ketones, UA: NEGATIVE
Leukocytes, UA: NEGATIVE
Nitrite, UA: NEGATIVE
PROTEIN UA: NEGATIVE
RBC UA: NEGATIVE

## 2017-01-30 MED ORDER — ONDANSETRON 8 MG PO TBDP: 8.0000 mg | ORAL_TABLET | Freq: Three times a day (TID) | ORAL | 0 refills | Status: DC | PRN

## 2017-01-30 MED ORDER — OMEPRAZOLE 20 MG PO CPDR: 20.0000 mg | DELAYED_RELEASE_CAPSULE | Freq: Every day | ORAL | 6 refills | Status: DC

## 2017-01-30 NOTE — Progress Notes (Signed)
Patient ID: Claire Harrison, female   DOB: 12/31/1995, 21 y.o.   MRN: [id number] Fetal Surveillance Testing today:  FHR 160   High Risk Pregnancy Diagnosis(es):   Class A2/B DM suboptimal control  G2P0010 4738w3d Estimated Date of Delivery: 05/19/17  Blood pressure 122/74, pulse 92, temperature 98 F (36.7 C), weight 236 lb (107 kg), last menstrual period 05/18/2016.  Urinalysis: Negative   HPI: The patient is being seen today for ongoing management of as above. Today she reports CBG are high, did not bring log   BP weight and urine results all reviewed and noted. Patient reports good fetal movement, denies any bleeding and no rupture of membranes symptoms or regular contractions.  Fundal Height:  26 Fetal Heart rate:  160 Edema:  none  Patient is without complaints other than noted in her HPI. All questions were answered.  All lab and sonogram results have been reviewed. Comments:    Assessment:  1.  Pregnancy at 3338w3d,  Estimated Date of Delivery: 05/19/17 :                          2.  Class A2/B DM                        3.    Medication(s) Plans:  Glyburide 2.5 mg BID  Treatment Plan:  Follow up Thursday with log  Return in about 6 days (around 02/05/2017) for HROB, with Dr Despina HiddenEure. for appointment for high risk OB care  Meds ordered this encounter  Medications  . omeprazole (PRILOSEC) 20 MG capsule    Sig: Take 1 capsule (20 mg total) by mouth daily. 1 tablet a day    Dispense:  30 capsule    Refill:  6  . ondansetron (ZOFRAN ODT) 8 MG disintegrating tablet    Sig: Take 1 tablet (8 mg total) by mouth every 8 (eight) hours as needed for nausea or vomiting.    Dispense:  20 tablet    Refill:  0   Orders Placed This Encounter  Procedures  . POCT urinalysis dipstick

## 2017-02-05 ENCOUNTER — Encounter: Payer: Self-pay | Admitting: Obstetrics & Gynecology

## 2017-02-05 ENCOUNTER — Ambulatory Visit (INDEPENDENT_AMBULATORY_CARE_PROVIDER_SITE_OTHER): Payer: Medicaid Other | Admitting: Obstetrics & Gynecology

## 2017-02-05 VITALS — BP 110/60 | HR 86 | Wt 236.0 lb

## 2017-02-05 DIAGNOSIS — O0992 Supervision of high risk pregnancy, unspecified, second trimester: Secondary | ICD-10-CM

## 2017-02-05 DIAGNOSIS — Z1389 Encounter for screening for other disorder: Secondary | ICD-10-CM | POA: Diagnosis not present

## 2017-02-05 DIAGNOSIS — Z331 Pregnant state, incidental: Secondary | ICD-10-CM | POA: Diagnosis not present

## 2017-02-05 DIAGNOSIS — O24419 Gestational diabetes mellitus in pregnancy, unspecified control: Secondary | ICD-10-CM

## 2017-02-05 LAB — POCT URINALYSIS DIPSTICK
Blood, UA: NEGATIVE
GLUCOSE UA: NEGATIVE
Leukocytes, UA: NEGATIVE
Nitrite, UA: NEGATIVE

## 2017-02-05 MED ORDER — INSULIN GLARGINE 100 UNITS/ML SOLOSTAR PEN: 20.0000 [IU] | PEN_INJECTOR | Freq: Every day | SUBCUTANEOUS | 11 refills | Status: DC

## 2017-02-05 NOTE — Progress Notes (Signed)
Fetal Surveillance Testing today:  FHR 145   High Risk Pregnancy Diagnosis(es):   Class A2/B diabetes  G2P0010 3046w2d Estimated Date of Delivery: 05/19/17  Blood pressure 110/60, pulse 86, weight 236 lb (107 kg), last menstrual period 05/18/2016.  Urinalysis: Negative   HPI: The patient is being seen today for ongoing management of as above. Today she reports CBG are suboptimal, especially the fastings   BP weight and urine results all reviewed and noted. Patient reports good fetal movement, denies any bleeding and no rupture of membranes symptoms or regular contractions.  Fundal Height: 30 Fetal Heart rate:  140 Edema:  1+  Patient is without complaints other than noted in her HPI. All questions were answered.  All lab and sonogram results have been reviewed. Comments:    Assessment:  1.  Pregnancy at 2446w2d,  Estimated Date of Delivery: 05/19/17 :                          2.  Class A2/B DM                        3.    Medication(s) Plans:  glybride 2.5 BID, add Lantus 20 units @2200   Treatment Plan:  Add 20 units to her glyburide 2.5 BID  Return in about 2 weeks (around 02/19/2017) for HROB, with Dr Despina HiddenEure. for appointment for high risk OB care  Meds ordered this encounter  Medications  . insulin glargine (LANTUS) 100 unit/mL SOPN    Sig: Inject 0.2 mLs (20 Units total) into the skin at bedtime.    Dispense:  15 mL    Refill:  11   Orders Placed This Encounter  Procedures  . POCT urinalysis dipstick

## 2017-02-20 ENCOUNTER — Encounter: Payer: Medicaid Other | Admitting: Obstetrics & Gynecology

## 2017-02-24 ENCOUNTER — Encounter: Payer: Medicaid Other | Admitting: Advanced Practice Midwife

## 2017-02-24 ENCOUNTER — Other Ambulatory Visit: Payer: Medicaid Other

## 2017-04-15 ENCOUNTER — Other Ambulatory Visit: Payer: Self-pay | Admitting: Advanced Practice Midwife

## 2017-04-29 DIAGNOSIS — O1490 Unspecified pre-eclampsia, unspecified trimester: Secondary | ICD-10-CM

## 2017-07-31 ENCOUNTER — Encounter (HOSPITAL_COMMUNITY): Payer: Self-pay

## 2018-01-06 ENCOUNTER — Other Ambulatory Visit: Payer: Self-pay | Admitting: Women's Health

## 2018-06-08 LAB — HEMOGLOBIN A1C: HEMOGLOBIN A1C: 12

## 2018-06-18 DIAGNOSIS — F419 Anxiety disorder, unspecified: Secondary | ICD-10-CM | POA: Insufficient documentation

## 2018-08-12 ENCOUNTER — Ambulatory Visit: Payer: Medicaid Other | Admitting: Nutrition

## 2018-08-20 LAB — HEMOGLOBIN A1C: Hemoglobin A1C: 8.9

## 2018-09-08 LAB — HEMOGLOBIN A1C: Hemoglobin A1C: 9.3

## 2018-10-05 ENCOUNTER — Ambulatory Visit: Payer: Medicaid Other | Admitting: Nutrition

## 2018-10-06 ENCOUNTER — Telehealth: Payer: Self-pay | Admitting: Nutrition

## 2018-10-06 ENCOUNTER — Encounter: Payer: Medicaid Other | Attending: Unknown Physician Specialty | Admitting: Nutrition

## 2018-10-06 VITALS — Ht 66.0 in | Wt 222.0 lb

## 2018-10-06 DIAGNOSIS — IMO0002 Reserved for concepts with insufficient information to code with codable children: Secondary | ICD-10-CM

## 2018-10-06 DIAGNOSIS — E118 Type 2 diabetes mellitus with unspecified complications: Secondary | ICD-10-CM | POA: Diagnosis not present

## 2018-10-06 DIAGNOSIS — E1165 Type 2 diabetes mellitus with hyperglycemia: Secondary | ICD-10-CM | POA: Insufficient documentation

## 2018-10-06 DIAGNOSIS — Z3A11 11 weeks gestation of pregnancy: Secondary | ICD-10-CM | POA: Diagnosis present

## 2018-10-06 NOTE — Telephone Encounter (Signed)
vm to return my call

## 2018-10-06 NOTE — Progress Notes (Signed)
Medical Nutrition Therapy:  Appt start time: 1100 end time:  24825.  Assessment:  Primary concerns today: DM Type 2. Para 3 1 SAB, G1. Previous eclampia and C section. Is currently [redacted] weeks pregnant. [facility] 04/26/[facility].  Lives with 25 yr old daughter.  Eats 3 meals per day with snacks. Sees Dr. Gwynne Edinger for her OBGYN..Dx Type 2 in October 2019. Will most likely be followed at [facility] in the future. PCP: Tresa Moore, FNP at [facility]. She desires this pregnancy and wants to make sure the safety of her baby. Admits to not being compliant before. A1C was 12% at DX.  She is interested in an insulin pump. Has medicaid.  Working at [employer] full time.  Was on depression medication but is no longer taking due to being pregnant. Took her 36 units of Lantus at night. Takes about 10 units of Humalog with meals. Has a sliding scale from OBGYN but not sure of scale. This am was 199 mg/dl,. Admits sometimes forgetting her Novolog to take with meals.  Yesterday 165 mg/dl, 2 hrs after lunch 003, 2 hrs after supper 111 mg/dl. Sliding scale:  FBS 105 or less, 2 hours after meal  Take insulin if BS is 130 mg/dl.    Willing to make changes with diet and comply with medications to improve outcomes for this pregnancy.   She is involved with the baby's father and he is supportive. Will look into an Omni Pod since she has Medicaid during this pregnancy.   Preferred Learning Style:  No preference indicated   Learning Readiness:   Ready  Change in progress   MEDICATIONS:   DIETARY INTAKE:  24-hr recall:  B ( AM): Fruit loops, 1 cup  Snk ( AM):  L ( PM): 2 tacos homemade on corn tortillas, water and 1/2 soda Snk ( PM):  D ( PM): 6" sub- filly cheesteak with chips and  Water,  cheerwine Snk ( PM):  Beverages: water, sodas  Usual physical activity:ADL  Estimated energy needs: 1800  calories 200 g carbohydrates 125 g protein 50 g fat  Progress Towards Goal(s):  In  progress.   Nutritional Diagnosis:  NB-1.1 Food and nutrition-related knowledge deficit As related to Daibetes Type and Prenant.  As evidenced by A1C 12% , [redacted] weeks gestation.    Intervention:  . Nutrition During Pregnancy, diabetes during pregnancy Nutrition and Diabetes education provided on My Plate, CHO counting, meal planning, portion sizes, timing of meals, avoiding snacks between meals unless having a low blood sugar, target ranges for A1C and blood sugars, signs/symptoms and treatment of hyper/hypoglycemia, monitoring blood sugars, taking medications as prescribed, benefits of exercising 30 minutes per day and prevention of complications of DM.     Insulin dosing during pregnancy with Lantus instead of NPH.  11 weeks. . .7 units/kg for TDD TDD =  70 units  Am Dose (2/3 of TDD) = 46 units                1/3 of AM Dose for short acting =15 units                 2/3 of am dose will go for long acting insulin- Lantus at bedtime.   Pm Dosing (1/3) TDD = 23 units                1//2 of PM Dose is short acting 12 units  2/3 of pm dose is long acting- lantus at bedtime  Total amount of short acting is  27 units / 3 meals = 9 units Humalog with meals. (Currently taking 10 units with meals plus sliding scale BEFORE meals)  Currently Lantus dose is 36 units.  Recommend to continue Lantus 36 units at night and 10 units Humalog with meals plus sliding scale. Will re-evaluate BS reading testing before and 2 hours after meals 6 times per day in 1 week.  Goals 1. Follow Gestational DM Meal Plan as discussed 2. B) 2-3 carb choice, protein, AM Snack 1 carb choice, 1 oz protein, veggies,   Lunch 2-3 carb choices, protein, veggies., pm snack 1 carb choice plus protein, veggies. Dinner 2-3 carb choices, protein, veggies, , pm snack 1 carb choice,1  protein, veggies. Goal Blood sugars before meals < 95 mg/dl, 2 hours after meal less than 120 mg/dl Drink only water Cut out sodas,  sweets, junk food Increase fresh fruits, veggies.  Test blood sugar before meals and 2 hours after each meal Follow Sliding scale for insulin.  Lantus 36 units at night 10 units plus sliding scale of HUmalog before meals Call MD if BS are over 150's or less than 70 more than twice. Record BS on the sheets given and being meter and log sheets to all appts.   .  Teaching Method Utilized:  Visual Auditory Hands on  Handouts given during visit include:  The Plate Method   Meal Plan Card   Barriers to learning/adherence to lifestyle change: none  Demonstrated degree of understanding via:  Teach Back   Monitoring/Evaluation:  Dietary intake, exercise, , and body weight in 1 week.. Recommend to continue Lantus 36 units at night and 10 units Humalog with meals plus sliding scale. Will re-evaluate BS reading testing before and 2 hours after meals 6 times per day in 1 week.   Will contact Omipod rep to discuss if she qualifies for a pump.

## 2018-10-07 ENCOUNTER — Telehealth: Payer: Self-pay | Admitting: Nutrition

## 2018-10-07 NOTE — Telephone Encounter (Signed)
TC to Claire Moore, FNP  to review current treatment of blood sugars with basal bolus regimen. Biggest barrier for best blood sugar control is patient compliance with diet and medications. However, informed her that patient has been showing postivie motivation to make necessary changes to improve BS and prevent complications during this pregnancy. Patient is taking 36 units of Lantus and 10 units of Humalog plus sliding scale with meals and testing before and 2 hours after meals 6 times per day. She verbalized understanding. I will follow pt weekly unless she gets treatment at high risk prenatal clinci at [facility].

## 2018-10-11 ENCOUNTER — Telehealth: Payer: Self-pay | Admitting: Nutrition

## 2018-10-11 ENCOUNTER — Encounter: Payer: Self-pay | Admitting: Nutrition

## 2018-10-11 NOTE — Patient Instructions (Addendum)
Goals 1. Follow Gestational DM Meal Plan as discussed 2. B) 2-3 carb choice, protein, AM Snack 1 carb choice, 1 oz protein, veggies,   Lunch 2-3 carb choices, protein, veggies., pm snack 1 carb choice plus protein, veggies. Dinner 2-3 carb choices, protein, veggies, , pm snack 1 carb choice,1  protein, veggies. Goal Blood sugars before meals < 95 mg/dl, 2 hours after meal less than 120 mg/dl Drink only water Cut out sodas, sweets, junk food Increase fresh fruits, veggies.  Test blood sugar before meals and 2 hours after each meal Follow Sliding scale for insulin.  Lantus 36 units at night 10 units plus sliding scale of Humalog before meals Call MD if BS are over 150's or less than 70 more than twice. Record BS on the sheets given and being meter and log sheets to all appts.

## 2018-10-11 NOTE — Telephone Encounter (Signed)
TC from Amelia, Charity fundraiser at [employer]. Informed her of patient's BS readings and insulin taken as per my conversation with patient this am.. Lisaotes she will pass on the information to Dr. Francee Piccolo. Informed her I haven't been able to get in touch with Omni pod rep yet. They haven't returned my call. She verbalized understanding.

## 2018-10-11 NOTE — Telephone Encounter (Signed)
TC from pt. To review her BS readings and insulin regimen. Taking 36 units Lantus at night and suppose to be taking 10 units Humalog plus sliding scale with meals.   10/07/18  Thursday FBS 127 BL 104, BS 96 AS 141 Bedtime 79 mg/dl, BL 696120 16 2/95/281/10/20 BB 413(24123(16 units) BL 88 mg/dl(10 units), BS 123mg /dl(12 units) AS  222mg /dl bedtime 401106 mg/dl 0/27/251/11/20 FBS 366127 mg/dl (12 units), AB 440110 mg/dl, BL 347120 mg/dl (16 units) BS 425130 ZD/GL(87mg/dl(12) AS 564104 Sunday 3/32/95JOAC'Z1/12/20DIdn't eat much on Sunday due to being sick from virus. Had symptoms of low blood sugars-- shaky. Really busy at work. 10/11/17 sleep in til 11 am. Didn't check BS ate 1/2 banana and glass of milk.  She found Dr. Francee PiccoloMcCloud BS sliding scale:<150 Take 10 units of HUmalog 150-199-add 2 units, 200-249, add 4 units, 250-300 add 6 units. 300-350 8 units  She had been following a sliding scale coverage that I provided which was 1 unit for every 10-15 pts above 120 mg/dl. Reviewed to follow Dr. Francee PiccoloMccloud sliding scale coverage now.  Reviewed need for increased vegetables and water intake and eat meals on time as best as she can. She verbalized understanding. Informed her I would share information with DR. McCLoud office and they may want to make further adjustments with insulin. She verbalized understanding.

## 2018-10-12 NOTE — Telephone Encounter (Signed)
Informed her I would share BS readings with OBGYN and they will contact her if any changes to be made. She verbalized understanding.

## 2018-10-13 ENCOUNTER — Ambulatory Visit: Payer: Medicaid Other | Admitting: Nutrition

## 2018-10-20 ENCOUNTER — Telehealth: Payer: Self-pay | Admitting: Nutrition

## 2018-10-20 ENCOUNTER — Ambulatory Visit: Payer: Medicaid Other | Admitting: Nutrition

## 2018-10-20 NOTE — Telephone Encounter (Signed)
VM to leave message Dr. Fransico Him appt has been changed to 11-09-18 at 1:30. Advised to bring meter and log sheets. Left message that Omni pod rep would be calling to get her on pump Left message to call me back to reschedule missed appt with me today.

## 2018-10-20 NOTE — Telephone Encounter (Signed)
TC to [employer]. Talked to nurse to inform her referral to Omnipod- Lanora Manis has been made. They will contact pt to discuss coverage. Nurse said pt was seen this am and Lantus was increased to 45 units in am and meal time of 2 units with meals plus sliding scale was left the same.

## 2018-10-26 ENCOUNTER — Telehealth: Payer: Self-pay | Admitting: Nutrition

## 2018-10-26 NOTE — Telephone Encounter (Signed)
TC from [employer]. [name]. She noted that the omnipod is to be shipped to pt. Claire Harrison will be the person doing the training. She will contact patient to let them know the omnipod is being shipped and schedule a date to go over training. Will notify Dr. Mora Appl office to let them know of the date of training once it's confirmed.

## 2018-10-28 ENCOUNTER — Ambulatory Visit: Payer: Medicaid Other | Admitting: Nutrition

## 2018-11-09 ENCOUNTER — Ambulatory Visit: Payer: Medicaid Other | Admitting: Nutrition

## 2018-11-09 ENCOUNTER — Ambulatory Visit: Payer: Self-pay | Admitting: "Endocrinology

## 2018-11-11 ENCOUNTER — Encounter: Payer: Self-pay | Admitting: "Endocrinology

## 2018-11-11 ENCOUNTER — Ambulatory Visit: Payer: Medicaid Other | Admitting: Nutrition

## 2018-11-11 ENCOUNTER — Ambulatory Visit (INDEPENDENT_AMBULATORY_CARE_PROVIDER_SITE_OTHER): Payer: Medicaid Other | Admitting: "Endocrinology

## 2018-11-11 VITALS — BP 113/77 | HR 73 | Ht 62.0 in | Wt 223.2 lb

## 2018-11-11 DIAGNOSIS — E1165 Type 2 diabetes mellitus with hyperglycemia: Secondary | ICD-10-CM | POA: Diagnosis not present

## 2018-11-11 DIAGNOSIS — Z3A16 16 weeks gestation of pregnancy: Secondary | ICD-10-CM | POA: Diagnosis not present

## 2018-11-11 DIAGNOSIS — E119 Type 2 diabetes mellitus without complications: Secondary | ICD-10-CM | POA: Insufficient documentation

## 2018-11-11 NOTE — Progress Notes (Signed)
Claire Harrison, CMA  

## 2018-11-11 NOTE — Patient Instructions (Signed)

## 2018-11-12 NOTE — Progress Notes (Signed)
Endocrinology Consult Note       11/12/2018, 1:41 PM   Subjective:    Patient ID: Claire Harrison, female    DOB: 01/25/1996.  Claire Harrison is being seen in consultation for management of currently uncontrolled symptomatic diabetes requested by  Gwenlyn FudgeJoyce, Britney F, FNP.   Past Medical History:  Diagnosis Date  . Medical history non-contributory    Past Surgical History:  Procedure Laterality Date  . DILATION AND CURETTAGE OF UTERUS N/A 08/01/2016   Procedure: SUCTION DILATATION AND CURETTAGE;  Surgeon: Tilda BurrowJohn V Ferguson, MD;  Location: [facility];  Service: Gynecology;  Laterality: N/A;  . NO PAST SURGERIES     Social History   Socioeconomic History  . Marital status: Married    Spouse name: Not on file  . Number of children: Not on file  . Years of education: Not on file  . Highest education level: Not on file  Occupational History  . Not on file  Social Needs  . Financial resource strain: Not on file  . Food insecurity:    Worry: Not on file    Inability: Not on file  . Transportation needs:    Medical: Not on file    Non-medical: Not on file  Tobacco Use  . Smoking status: Never Smoker  . Smokeless tobacco: Never Used  Substance and Sexual Activity  . Alcohol use: No  . Drug use: No  . Sexual activity: Yes    Birth control/protection: None  Lifestyle  . Physical activity:    Days per week: Not on file    Minutes per session: Not on file  . Stress: Not on file  Relationships  . Social connections:    Talks on phone: Not on file    Gets together: Not on file    Attends religious service: Not on file    Active member of club or organization: Not on file    Attends meetings of clubs or organizations: Not on file    Relationship status: Not on file  Other Topics Concern  . Not on file  Social History Narrative  . Not on file   Outpatient Encounter Medications as of 11/11/2018   Medication Sig  . Insulin Glargine (LANTUS) 100 UNIT/ML Solostar Pen Inject 50 Units into the skin at bedtime.  Marland Kitchen. acetaminophen (TYLENOL) 325 MG tablet Take 650 mg by mouth every 6 (six) hours as needed.  Marland Kitchen. aspirin EC 81 MG tablet Take 1 tablet (81 mg total) by mouth daily.  . insulin lispro (HUMALOG) 100 UNIT/ML injection Inject 4-10 Units into the skin 3 (three) times daily before meals.  . ondansetron (ZOFRAN ODT) 8 MG disintegrating tablet Take 1 tablet (8 mg total) by mouth every 8 (eight) hours as needed for nausea or vomiting. (Patient not taking: Reported on 02/05/2017)  . Prenatal Vit-Fe Fumarate-FA (PRENATAL VITAMIN PO) Take by mouth daily.   . [DISCONTINUED] glyBURIDE (DIABETA) 2.5 MG tablet TAKE 1 TABLET BY MOUTH TWICE DAILY WITH  A  MEAL (Patient not taking: Reported on 10/06/2018)  . [DISCONTINUED] insulin glargine (LANTUS) 100 unit/mL SOPN Inject 0.2 mLs (20 Units total) into the skin  at bedtime. (Patient taking differently: Inject 50 Units into the skin at bedtime. )  . [DISCONTINUED] omeprazole (PRILOSEC) 20 MG capsule Take 1 capsule (20 mg total) by mouth daily. 1 tablet a day (Patient not taking: Reported on 11/11/2018)   No facility-administered encounter medications on file as of 11/11/2018.     ALLERGIES: Allergies  Allergen Reactions  . Penicillins Anaphylaxis  . Mango Flavor Hives and Swelling  . Peanut-Containing Drug Products   . Latex Rash    VACCINATION STATUS: Immunization History  Administered Date(s) Administered  . Influenza,inj,Quad PF,6+ Mos 10/13/2016    Diabetes  She presents for her initial diabetic visit. She has type 2 diabetes mellitus. Onset time: She was diagnosed at approximate age of 21 years.  She had an episode of gestational diabetes 2 years prior. Her disease course has been improving (She is currently pregnant at [redacted] weeks of gestation.  She was initiated on multiple daily injections of insulin with slow up titration of basal insulin  currently on Lantus 50 units nightly and Humalog 2 to 3 units 3 times daily AC.). There are no hypoglycemic associated symptoms. Pertinent negatives for hypoglycemia include no confusion, headaches, pallor or seizures. There are no diabetic associated symptoms. Pertinent negatives for diabetes include no chest pain, no polydipsia, no polyphagia and no polyuria. There are no hypoglycemic complications. Symptoms are improving. There are no diabetic complications. Risk factors for coronary artery disease include diabetes mellitus, obesity and sedentary lifestyle. Current diabetic treatment includes insulin injections. Her weight is fluctuating minimally. She is following a generally unhealthy diet. When asked about meal planning, she reported none. She has had a previous visit with a dietitian. She never participates in exercise. (She returns with controlled glycemia towards average of 144 over the last 14 days.Marland Kitchen) An ACE inhibitor/angiotensin II receptor blocker is not being taken. She does not see a podiatrist.Eye exam is not current.      Review of Systems  Constitutional: Negative for chills, fever and unexpected weight change.  HENT: Negative for trouble swallowing and voice change.   Eyes: Negative for visual disturbance.  Respiratory: Negative for cough, shortness of breath and wheezing.   Cardiovascular: Negative for chest pain, palpitations and leg swelling.  Gastrointestinal: Negative for diarrhea, nausea and vomiting.  Endocrine: Negative for cold intolerance, heat intolerance, polydipsia, polyphagia and polyuria.  Musculoskeletal: Negative for arthralgias and myalgias.  Skin: Negative for color change, pallor, rash and wound.  Neurological: Negative for seizures and headaches.  Psychiatric/Behavioral: Negative for confusion and suicidal ideas.    Objective:    BP 113/77   Pulse 73   Ht 5\' 2"  (1.575 m)   Wt 223 lb 3.2 oz (101.2 kg)   BMI 40.82 kg/m   Wt Readings from Last 3  Encounters:  11/11/18 223 lb 3.2 oz (101.2 kg)  10/06/18 222 lb (100.7 kg)  02/05/17 236 lb (107 kg)     Physical Exam Constitutional:      Appearance: She is well-developed.  HENT:     Head: Normocephalic and atraumatic.  Neck:     Musculoskeletal: Normal range of motion and neck supple.     Thyroid: No thyromegaly.     Trachea: No tracheal deviation.  Cardiovascular:     Rate and Rhythm: Normal rate and regular rhythm.  Pulmonary:     Effort: Pulmonary effort is normal.     Breath sounds: Normal breath sounds.  Abdominal:     Tenderness: There is no abdominal tenderness. There is no  guarding.  Musculoskeletal: Normal range of motion.  Skin:    General: Skin is warm and dry.     Coloration: Skin is not pale.     Findings: No erythema or rash.  Neurological:     Mental Status: She is alert and oriented to person, place, and time.     Cranial Nerves: No cranial nerve deficit.     Coordination: Coordination normal.     Deep Tendon Reflexes: Reflexes are normal and symmetric.  Psychiatric:        Judgment: Judgment normal.     Recent Results (from the past 2160 hour(s))  Hemoglobin A1c     Status: None   Collection Time: 08/20/18 12:00 AM  Result Value Ref Range   Hemoglobin A1C 8.9   Hemoglobin A1c     Status: None   Collection Time: 09/08/18 12:00 AM  Result Value Ref Range   Hemoglobin A1C 9.3       Assessment & Plan:   1. Uncontrolled type 2 diabetes mellitus with hyperglycemia (HCC) 2. [redacted] weeks gestation of pregnancy  - Alyssamae Asuncion has currently uncontrolled symptomatic type 2 DM since 23 years of age,  with most recent A1c of 9.3% on December 08/18/2018. Recent labs reviewed.  She had an episode of gestational diabetes for 2 years prior.  She is currently pregnant at [redacted] weeks of gestation with her second child.  - I had a long discussion with her about the progressive nature of diabetes and the pathology behind its complications. -She does not report  gross complications from diabetes, however, she remains at a high risk for more acute and chronic complications which include CAD, CVA, CKD, retinopathy, and neuropathy. These are all discussed in detail with her.  - I have counseled her on diet management  by adopting a carbohydrate restricted/protein rich diet. - she admits that there is a room for improvement in her food and drink choices. - Suggestion is made for her to avoid simple carbohydrates  from her diet including Cakes, Sweet Desserts, Ice Cream, Soda (diet and regular), Sweet Tea, Candies, Chips, Cookies, Store Bought Juices, Alcohol in Excess of  1-2 drinks a day, Artificial Sweeteners,  Coffee Creamer, and "Sugar-free" Products. This will help patient to have more stable blood glucose profile and potentially avoid unintended weight gain.  - I encouraged her to switch to  unprocessed or minimally processed complex starch and increased protein intake (animal or plant source), fruits, and vegetables.  - she is advised to stick to a routine mealtimes to eat 3 meals  a day and avoid unnecessary snacks ( to snack only to correct hypoglycemia).   - she is already following with  Norm Salt, RDN, CDE for individualized diabetes education.  - I have approached her with the following individualized plan to manage diabetes and patient agrees:   - she presents with controlled glycemic profile to target.  She followed instructions very well to titrate her basal insulin and displaced proper engagement. -Even though she is in the process of obtaining insulin pump with Omni pod, I believe she will continue to do well on MDI considering that she is requiring minimal prandial insulin.  Hence, insulin pump treatment may not be necessary in her case.  -  She is advised to continue Lantus 50 units nightly, readjust her Humalog to 4  units 3 times a day with meals  for pre-meal BG readings of 90-150mg /dl, plus patient specific correction dose for  unexpected hyperglycemia above 150mg /dl,  associated with strict monitoring of glucose 4 times a day-before meals and at bedtime. - she is warned not to take insulin without proper monitoring per orders. - Adjustment parameters are given to her for hypo and hyperglycemia in writing. - she is encouraged to call clinic for blood glucose levels less than 70 or above 200 mg /dl.  - she is not a candidate for incretin therapy, metformin, SGLT2 inhibitors at this time.    -After delivery, she will be considered for treatment with metformin, and incretin therapy as appropriate.  - Patient specific target  A1c;  LDL, HDL, Triglycerides, and  Waist Circumference were discussed in detail.  2) Blood Pressure /Hypertension:  her blood pressure is  controlled to target.  She is not on any antihypertensive medications at this time.  3) Lipids/Hyperlipidemia: She does not have recent lipid panel.  She is not on statin therapy.  4)  Weight/Diet:  Body mass index is 40.82 kg/m.  -She is pregnant with her second child at 16 weeks of gestation.  Weight loss is not advisable at this time.  CDE Consult will be initiated . Exercise, and detailed carbohydrates information provided  -  detailed on discharge instructions.  5) Chronic Care/Health Maintenance:  -she  is encouraged to initiate and continue to follow up with Ophthalmology, Dentist,  Podiatrist at least yearly or according to recommendations, and advised to  stay away from smoking. I have recommended yearly flu vaccine and pneumonia vaccine at least every 5 years; moderate intensity exercise for up to 150 minutes weekly; and  sleep for at least 7 hours a day.  - she is  advised to maintain close follow up with Gwenlyn FudgeJoyce, Britney F, FNP for primary care needs, as well as her other providers for optimal and coordinated care.  - Time spent with the patient: 35 minutes, of which >50% was spent in obtaining information about her symptoms, reviewing her previous  labs/studies, evaluations, and treatments, counseling her about her pre-gestational diabetes type 2 with early second trimester pregnancy, and developing plans for long term treatment based on the latest standards of care/guidelines.    Claire Harrison participated in the discussions, expressed understanding, and voiced agreement with the above plans.  All questions were answered to her satisfaction. she is encouraged to contact clinic should she have any questions or concerns prior to her return visit.  Follow up plan: - Return in about 1 week (around 11/18/2018) for Follow up with Meter and Logs Only - no Labs.  Marquis LunchGebre Nida, MD [facility] [address] [place], [place] [address] Phone: [phone]  Fax: [phone]    11/12/2018, 1:41 PM  This note was partially dictated with voice recognition software. Similar sounding words can be transcribed inadequately or may not  be corrected upon review.

## 2018-11-18 ENCOUNTER — Ambulatory Visit: Payer: Medicaid Other | Admitting: "Endocrinology

## 2018-11-22 ENCOUNTER — Telehealth: Payer: Self-pay | Admitting: Nutrition

## 2018-11-22 ENCOUNTER — Ambulatory Visit: Payer: Medicaid Other | Admitting: Nutrition

## 2018-11-22 ENCOUNTER — Ambulatory Visit: Payer: Medicaid Other | Admitting: "Endocrinology

## 2018-11-22 NOTE — Telephone Encounter (Signed)
TC to [facility] to notify them of the multiple canceled appts with me and Dr. Fransico Him, Endocrinology. She is reported to have an appt with them tomorrow for follow up. Advised to encourage her to reschedule appts  with Dr. Fransico Him  Especially. She can reschedule appt with me if needed. Staff verbalized understanding.

## 2018-12-01 ENCOUNTER — Ambulatory Visit: Payer: Self-pay | Admitting: "Endocrinology

## 2018-12-20 ENCOUNTER — Other Ambulatory Visit: Payer: Self-pay

## 2018-12-20 ENCOUNTER — Ambulatory Visit: Payer: Medicaid Other | Admitting: Nutrition

## 2018-12-20 ENCOUNTER — Telehealth (INDEPENDENT_AMBULATORY_CARE_PROVIDER_SITE_OTHER): Payer: Medicaid Other | Admitting: "Endocrinology

## 2018-12-20 DIAGNOSIS — E1165 Type 2 diabetes mellitus with hyperglycemia: Secondary | ICD-10-CM

## 2018-12-20 DIAGNOSIS — Z3A21 21 weeks gestation of pregnancy: Secondary | ICD-10-CM

## 2018-12-20 MED ORDER — ACCU-CHEK GUIDE W/DEVICE KIT: 1.0000 | PACK | 0 refills | Status: DC

## 2018-12-20 MED ORDER — GLUCOSE BLOOD VI STRP: ORAL_STRIP | 2 refills | Status: DC

## 2018-12-20 NOTE — Patient Instructions (Addendum)
                                                  Endocrinology Telephone Visit Follow up Note -During COVID -19 Pandemic       12/20/2018, 1:39 PM   Subjective:    Patient ID: Claire Harrison, female    DOB: 10/26/1995.  Claire Harrison is being seen via telephone during the coronavirus pandemic for the management of type 2 diabetes, second trimester pregnancy.    PCP: Joyce, Britney F, FNP.   Past Medical History:  Diagnosis Date  . Medical history non-contributory    Past Surgical History:  Procedure Laterality Date  . DILATION AND CURETTAGE OF UTERUS N/A 08/01/2016   Procedure: SUCTION DILATATION AND CURETTAGE;  Surgeon: John V Ferguson, MD;  Location: [facility];  Service: Gynecology;  Laterality: N/A;  . NO PAST SURGERIES     Social History   Socioeconomic History  . Marital status: Married    Spouse name: Not on file  . Number of children: Not on file  . Years of education: Not on file  . Highest education level: Not on file  Occupational History  . Not on file  Social Needs  . Financial resource strain: Not on file  . Food insecurity:    Worry: Not on file    Inability: Not on file  . Transportation needs:    Medical: Not on file    Non-medical: Not on file  Tobacco Use  . Smoking status: Never Smoker  . Smokeless tobacco: Never Used  Substance and Sexual Activity  . Alcohol use: No  . Drug use: No  . Sexual activity: Yes    Birth control/protection: None  Lifestyle  . Physical activity:    Days per week: Not on file    Minutes per session: Not on file  . Stress: Not on file  Relationships  . Social connections:    Talks on phone: Not on file    Gets together: Not on file    Attends religious service: Not on file    Active member of club or organization: Not on file    Attends meetings of clubs or organizations: Not on file    Relationship status: Not on file  Other Topics Concern  . Not on file  Social History Narrative  . Not on file    Outpatient Encounter Medications as of 12/20/2018  Medication Sig  . acetaminophen (TYLENOL) 325 MG tablet Take 650 mg by mouth every 6 (six) hours as needed.  . aspirin EC 81 MG tablet Take 1 tablet (81 mg total) by mouth daily.  . Blood Glucose Monitoring Suppl (ACCU-CHEK GUIDE) w/Device KIT 1 Piece by Does not apply route as directed.  . glucose blood (ACCU-CHEK GUIDE) test strip Use as instructed  . Insulin Glargine (LANTUS) 100 UNIT/ML Solostar Pen Inject 50 Units into the skin at bedtime.  . insulin lispro (HUMALOG) 100 UNIT/ML injection Inject 4-10 Units into the skin 3 (three) times daily before meals.  . ondansetron (ZOFRAN ODT) 8 MG disintegrating tablet Take 1 tablet (8 mg total) by mouth every 8 (eight) hours as needed for nausea or vomiting. (Patient not taking: Reported on 02/05/2017)  . Prenatal Vit-Fe Fumarate-FA (PRENATAL VITAMIN PO) Take by mouth daily.    No facility-administered encounter medications on file as of 12/20/2018.       ALLERGIES: Allergies  Allergen Reactions  . Penicillins Anaphylaxis  . Mango Flavor Hives and Swelling  . Peanut-Containing Drug Products   . Latex Rash    VACCINATION STATUS: Immunization History  Administered Date(s) Administered  . Influenza,inj,Quad PF,6+ Mos 10/13/2016    Diabetes  She presents for her initial diabetic visit. She has type 2 diabetes mellitus. Onset time: She was diagnosed at approximate age of 21 years.  She had an episode of gestational diabetes 2 years prior. Her disease course has been improving (She is currently pregnant at [redacted] weeks of gestation.  She is currently on Lantus 50 units nightly associated with monitoring of blood glucose 4 times a day.  She reports morning blood glucose readings ranging between 84 and 114, lunchtime readings between 104 and 170, suppertime readings between 120 and 180.  She does not report any hypoglycemia.    Pertinent negatives for hypoglycemia include no confusion, headaches,  pallor or seizures. There are no diabetic associated symptoms. Pertinent negatives for diabetes include no chest pain, no polydipsia, no polyphagia and no polyuria. There are no hypoglycemic complications. Symptoms are improving. There are no diabetic complications. Risk factors for coronary artery disease include diabetes mellitus, obesity and sedentary lifestyle. Current diabetic treatment includes insulin injections. Her weight is fluctuating minimally. She is following a generally unhealthy diet. When asked about meal planning, she reported none. She has had a previous visit with a dietitian. She never participates in exercise. (She returns with controlled glycemia towards average of 144 over the last 14 days..) An ACE inhibitor/angiotensin II receptor blocker is not being taken. She does not see a podiatrist.    Objective:    There were no vitals taken for this visit.  Wt Readings from Last 3 Encounters:  11/11/18 223 lb 3.2 oz (101.2 kg)  10/06/18 222 lb (100.7 kg)  02/05/17 236 lb (107 kg)     Assessment & Plan:   1. Uncontrolled type 2 diabetes mellitus with hyperglycemia (HCC) 2. [redacted] weeks gestation of pregnancy  - Claire Harrison has currently uncontrolled symptomatic type 2 DM since 23 years of age, she is at 21 weeks of gestation with her second child.  She reports previsit A1c of 6.4% improving from 9.3% in December 2019.   -She does not report gross complications from diabetes, however, she remains at a high risk for more acute and chronic complications which include CAD, CVA, CKD, retinopathy, and neuropathy. These are all discussed in detail with her.   - I encouraged her to switch to  unprocessed or minimally processed complex starch and increased protein intake (animal or plant source), fruits, and vegetables.  - she is already following with  Penny Crumpton, RDN, CDE for individualized diabetes education.  - I have approached her with the following individualized plan  to manage diabetes and patient agrees:   - she reports controlled glycemic profile for both fasting and postprandial.  Her previsit A1c was reported to be 6.4%.    -She would not tolerate any higher dose of insulin.  She is advised to continue Lantus 50 units nightly, continue to hold Humalog.    - she is encouraged to call clinic for blood glucose levels less than 70 or above 200 mg /dl.  - she is not a candidate for incretin therapy, metformin, SGLT2 inhibitors at this time.    -After delivery, she will be considered for treatment with metformin, and incretin therapy as appropriate.  - Patient specific target  A1c;  LDL, HDL,   Triglycerides, and  Waist Circumference were discussed in detail.   2) Lipids/Hyperlipidemia: She does not have recent lipid panel.  She is not on statin therapy.   - she is  advised to maintain close follow up with Joyce, Britney F, FNP for primary care needs, as well as her other providers for optimal and coordinated care.  - Time spent with the patient: 25 min, of which >50% was spent in reviewing her blood glucose logs , discussing her hypoglycemia and hyperglycemia episodes, reviewing her current and  previous labs / studies and medications  doses and developing a plan to avoid hypoglycemia and hyperglycemia.  Claire Harrison participated in the discussions, expressed understanding, and voiced agreement with the above plans.  All questions were answered to her satisfaction. she is encouraged to contact clinic should she have any questions or concerns prior to her return visit.  Follow up plan: -She is advised to return in 4 weeks with her meter and logs for evaluation. Gebre Claire Loomis, MD [facility] [address] [place], [place] [address] Phone: [phone]  Fax: [phone]    12/20/2018, 1:39 PM  This note was partially dictated with voice recognition software. Similar sounding words can be  transcribed inadequately or may not  be corrected upon review.  

## 2018-12-20 NOTE — Progress Notes (Signed)
Endocrinology Telephone Visit Follow up Note -During COVID -19 Pandemic       12/20/2018, 1:39 PM   Subjective:    Patient ID: Claire Harrison, female    DOB: November 09, 1995.  Claire Harrison is being seen via telephone during the coronavirus pandemic for the management of type 2 diabetes, second trimester pregnancy.    PCP: Loman Brooklyn, FNP.   Past Medical History:  Diagnosis Date  . Medical history non-contributory    Past Surgical History:  Procedure Laterality Date  . DILATION AND CURETTAGE OF UTERUS N/A 08/01/2016   Procedure: SUCTION DILATATION AND CURETTAGE;  Surgeon: Jonnie Kind, MD;  Location: [facility];  Service: Gynecology;  Laterality: N/A;  . NO PAST SURGERIES     Social History   Socioeconomic History  . Marital status: Married    Spouse name: Not on file  . Number of children: Not on file  . Years of education: Not on file  . Highest education level: Not on file  Occupational History  . Not on file  Social Needs  . Financial resource strain: Not on file  . Food insecurity:    Worry: Not on file    Inability: Not on file  . Transportation needs:    Medical: Not on file    Non-medical: Not on file  Tobacco Use  . Smoking status: Never Smoker  . Smokeless tobacco: Never Used  Substance and Sexual Activity  . Alcohol use: No  . Drug use: No  . Sexual activity: Yes    Birth control/protection: None  Lifestyle  . Physical activity:    Days per week: Not on file    Minutes per session: Not on file  . Stress: Not on file  Relationships  . Social connections:    Talks on phone: Not on file    Gets together: Not on file    Attends religious service: Not on file    Active member of club or organization: Not on file    Attends meetings of clubs or organizations: Not on file    Relationship status: Not on file  Other Topics Concern  . Not on file  Social History Narrative  . Not on file    Outpatient Encounter Medications as of 12/20/2018  Medication Sig  . acetaminophen (TYLENOL) 325 MG tablet Take 650 mg by mouth every 6 (six) hours as needed.  Marland Kitchen aspirin EC 81 MG tablet Take 1 tablet (81 mg total) by mouth daily.  . Blood Glucose Monitoring Suppl (ACCU-CHEK GUIDE) w/Device KIT 1 Piece by Does not apply route as directed.  Marland Kitchen glucose blood (ACCU-CHEK GUIDE) test strip Use as instructed  . Insulin Glargine (LANTUS) 100 UNIT/ML Solostar Pen Inject 50 Units into the skin at bedtime.  . insulin lispro (HUMALOG) 100 UNIT/ML injection Inject 4-10 Units into the skin 3 (three) times daily before meals.  . ondansetron (ZOFRAN ODT) 8 MG disintegrating tablet Take 1 tablet (8 mg total) by mouth every 8 (eight) hours as needed for nausea or vomiting. (Patient not taking: Reported on 02/05/2017)  . Prenatal Vit-Fe Fumarate-FA (PRENATAL VITAMIN PO) Take by mouth daily.    No facility-administered encounter medications on file as of 12/20/2018.  ALLERGIES: Allergies  Allergen Reactions  . Penicillins Anaphylaxis  . Mango Flavor Hives and Swelling  . Peanut-Containing Drug Products   . Latex Rash    VACCINATION STATUS: Immunization History  Administered Date(s) Administered  . Influenza,inj,Quad PF,6+ Mos 10/13/2016    Diabetes  She presents for her initial diabetic visit. She has type 2 diabetes mellitus. Onset time: She was diagnosed at approximate age of 23 years.  She had an episode of gestational diabetes 2 years prior. Her disease course has been improving (She is currently pregnant at [redacted] weeks of gestation.  She is currently on Lantus 25 units nightly associated with monitoring of blood glucose 4 times a day.  She reports morning blood glucose readings ranging between 84 and 114, lunchtime readings between 104 and 170, suppertime readings between 120 and 180.  She does not report any hypoglycemia.    Pertinent negatives for hypoglycemia include no confusion, headaches,  pallor or seizures. There are no diabetic associated symptoms. Pertinent negatives for diabetes include no chest pain, no polydipsia, no polyphagia and no polyuria. There are no hypoglycemic complications. Symptoms are improving. There are no diabetic complications. Risk factors for coronary artery disease include diabetes mellitus, obesity and sedentary lifestyle. Current diabetic treatment includes insulin injections. Her weight is fluctuating minimally. She is following a generally unhealthy diet. When asked about meal planning, she reported none. She has had a previous visit with a dietitian. She never participates in exercise. (She returns with controlled glycemia towards average of 144 over the last 14 days.Marland Kitchen) An ACE inhibitor/angiotensin II receptor blocker is not being taken. She does not see a podiatrist.    Objective:    There were no vitals taken for this visit.  Wt Readings from Last 3 Encounters:  11/11/18 223 lb 3.2 oz (101.2 kg)  10/06/18 222 lb (100.7 kg)  02/05/17 236 lb (107 kg)     Assessment & Plan:   1. Uncontrolled type 2 diabetes mellitus with hyperglycemia (HCC) 2. [redacted] weeks gestation of pregnancy  - [name] has currently uncontrolled symptomatic type 2 DM since 23 years of age, she is at 61 weeks of gestation with her second child.  She reports previsit A1c of 6.4% improving from 9.3% in December 2019.   -She does not report gross complications from diabetes, however, she remains at a high risk for more acute and chronic complications which include CAD, CVA, CKD, retinopathy, and neuropathy. These are all discussed in detail with her.   - I encouraged her to switch to  unprocessed or minimally processed complex starch and increased protein intake (animal or plant source), fruits, and vegetables.  - she is already following with  Jearld Fenton, RDN, CDE for individualized diabetes education.  - I have approached her with the following individualized plan  to manage diabetes and patient agrees:   - she reports controlled glycemic profile for both fasting and postprandial.  Her previsit A1c was reported to be 6.4%.    -She would not tolerate any higher dose of insulin.  She is advised to continue Lantus 50 units nightly, continue to hold Humalog.    - she is encouraged to call clinic for blood glucose levels less than 70 or above 200 mg /dl.  - she is not a candidate for incretin therapy, metformin, SGLT2 inhibitors at this time.    -After delivery, she will be considered for treatment with metformin, and incretin therapy as appropriate.  - Patient specific target  A1c;  LDL, HDL,  Triglycerides, and  Waist Circumference were discussed in detail.   2) Lipids/Hyperlipidemia: She does not have recent lipid panel.  She is not on statin therapy.   - she is  advised to maintain close follow up with Loman Brooklyn, FNP for primary care needs, as well as her other providers for optimal and coordinated care.  - Time spent with the patient: 25 min, of which >50% was spent in reviewing her blood glucose logs , discussing her hypoglycemia and hyperglycemia episodes, reviewing her current and  previous labs / studies and medications  doses and developing a plan to avoid hypoglycemia and hyperglycemia.  [name] participated in the discussions, expressed understanding, and voiced agreement with the above plans.  All questions were answered to her satisfaction. she is encouraged to contact clinic should she have any questions or concerns prior to her return visit.  Follow up plan: -She is advised to return in 4 weeks with her meter and logs for evaluation. Glade Lloyd, MD [facility] [address] [place], [place] [address] Phone: [phone]  Fax: [phone]    12/20/2018, 1:39 PM  This note was partially dictated with voice recognition software. Similar sounding words can be  transcribed inadequately or may not  be corrected upon review.

## 2018-12-21 ENCOUNTER — Encounter: Payer: Medicaid Other | Attending: Family Medicine | Admitting: Nutrition

## 2018-12-21 DIAGNOSIS — E118 Type 2 diabetes mellitus with unspecified complications: Secondary | ICD-10-CM | POA: Insufficient documentation

## 2018-12-21 DIAGNOSIS — IMO0002 Reserved for concepts with insufficient information to code with codable children: Secondary | ICD-10-CM

## 2018-12-21 DIAGNOSIS — Z3A21 21 weeks gestation of pregnancy: Secondary | ICD-10-CM | POA: Insufficient documentation

## 2018-12-21 DIAGNOSIS — E1165 Type 2 diabetes mellitus with hyperglycemia: Secondary | ICD-10-CM

## 2018-12-21 NOTE — Patient Instructions (Addendum)
Goals 1. Stick to non sugar cereals 2. Try PB sandwich instead of cereal for breakfast 3 Eat small snack between meals.  4. Keep drinking water 5.Try to eat small snack between meals

## 2018-12-21 NOTE — Progress Notes (Signed)
Telelphone visit Medical Nutrition Therapy:  Appt start time: 1100 end time:  84210.  Assessment:  Primary concerns today: DM Type 2. Para 3 1 SAB, G1. Previous eclampia and C section. Is currently 21 week 6 days.  weeks pregnant. EDC 03/31/19.  Lives with 25 yr old daughter. Has worked on cutting out junk food. Saw Dr. Fransico Him yesterday phone visit.  FBS 80-114.  2 hrs less than 150's.  Lantus 50 units a day and Sliding scale to start at 120 mg/dl- 4 units Humalog with meals. She notes the ultrasound shows the baby is doing well, and not overly big. Working on eating more consistency and better balanced meals.  Taking prenatal vitamins. Gong to OBGYN every 2 weeks.  Only testing twice a day now.. She noted she lost 2-3 lbs. Having some morning sickness at times. Can't tolerated some foods early in am.  Preferred Learning Style:  No preference indicated   Learning Readiness:   Ready  Change in progress   MEDICATIONS:   DIETARY INTAKE:  24-hr recall:  B ( AM):  Fruit loops or cinnamon toast cereal or cherrios. Snk ( AM):  L ( PM):  Pizza rolls,  Snk ( PM):  D ( PM): chicken and salsa, rice, water Snk ( PM):  Beverages: water, sodas  Usual physical activity:ADL  Estimated energy needs: 1800  calories 200 g carbohydrates 125 g protein 50 g fat  Progress Towards Goal(s):  In progress.   Nutritional Diagnosis:  NB-1.1 Food and nutrition-related knowledge deficit As related to Daibetes Type and Prenant.  As evidenced by A1C 12% , [redacted] weeks gestation.    Intervention:  . Nutrition During Pregnancy, diabetes during pregnancy Nutrition and Diabetes education provided on My Plate, CHO counting, meal planning, portion sizes, timing of meals, avoiding snacks between meals unless having a low blood sugar, target ranges for A1C and blood sugars, signs/symptoms and treatment of hyper/hypoglycemia, monitoring blood sugars, taking medications as prescribed, benefits of exercising 30  minutes per day and prevention of complications of DM.     Insulin dosing during pregnancy with Lantus instead of NPH.  11 weeks. . .7 units/kg for TDD TDD =  70 units  Am Dose (2/3 of TDD) = 46 units                1/3 of AM Dose for short acting =15 units                 2/3 of am dose will go for long acting insulin- Lantus at bedtime.   Pm Dosing (1/3) TDD = 23 units                1//2 of PM Dose is short acting 12 units                2/3 of pm dose is long acting- lantus at bedtime  Total amount of short acting is  27 units / 3 meals = 9 units Humalog with meals. (Currently taking 10 units with meals plus sliding scale BEFORE meals)    Goals 1. Stick to non sugar cereals 2. Try PB sandwich instead of cereal for breakfast 3 Eat small snack between meals.  4. Keep drinking water 5.Try to eat small snack between meals    .  Teaching Method Utilized:  Visual Auditory Hands on  Handouts given during visit include:  The Plate Method   Meal Plan Card   Barriers to learning/adherence to lifestyle change: none  Demonstrated degree of understanding via:  Teach Back   Monitoring/Evaluation:  Dietary intake, exercise, , and body weight in 2 week.Marland Kitchen

## 2018-12-28 ENCOUNTER — Encounter: Payer: Self-pay | Admitting: Nutrition

## 2019-01-04 ENCOUNTER — Encounter: Payer: Medicaid Other | Attending: Family Medicine | Admitting: Nutrition

## 2019-01-04 ENCOUNTER — Telehealth: Payer: Self-pay | Admitting: Nutrition

## 2019-01-04 ENCOUNTER — Other Ambulatory Visit: Payer: Self-pay

## 2019-01-04 NOTE — Telephone Encounter (Signed)
vm to call for f/u phone visit.

## 2019-01-17 ENCOUNTER — Ambulatory Visit: Payer: Medicaid Other | Admitting: "Endocrinology

## 2019-01-20 ENCOUNTER — Telehealth: Payer: Self-pay

## 2019-01-20 ENCOUNTER — Encounter: Payer: Medicaid Other | Attending: Unknown Physician Specialty | Admitting: Nutrition

## 2019-01-20 ENCOUNTER — Other Ambulatory Visit: Payer: Self-pay

## 2019-01-20 DIAGNOSIS — IMO0002 Reserved for concepts with insufficient information to code with codable children: Secondary | ICD-10-CM

## 2019-01-20 DIAGNOSIS — E118 Type 2 diabetes mellitus with unspecified complications: Secondary | ICD-10-CM | POA: Insufficient documentation

## 2019-01-20 DIAGNOSIS — E1165 Type 2 diabetes mellitus with hyperglycemia: Secondary | ICD-10-CM | POA: Insufficient documentation

## 2019-01-20 DIAGNOSIS — Z3492 Encounter for supervision of normal pregnancy, unspecified, second trimester: Secondary | ICD-10-CM

## 2019-01-20 MED ORDER — INSULIN GLARGINE 100 UNIT/ML SOLOSTAR PEN: 50.0000 [IU] | PEN_INJECTOR | Freq: Every day | SUBCUTANEOUS | 3 refills | Status: DC

## 2019-01-20 MED ORDER — INSULIN LISPRO 100 UNIT/ML ~~LOC~~ SOLN: 4.0000 [IU] | Freq: Three times a day (TID) | SUBCUTANEOUS | 2 refills | Status: DC

## 2019-01-20 MED ORDER — ACCU-CHEK GUIDE W/DEVICE KIT: 1.0000 | PACK | 0 refills | Status: DC

## 2019-01-20 MED ORDER — GLUCOSE BLOOD VI STRP: ORAL_STRIP | 2 refills | Status: DC

## 2019-01-20 NOTE — Progress Notes (Signed)
Telelphone visit Medical Nutrition Therapy:  Appt start time: 1300 end time:  1315.  Assessment:  Primary concerns today: DM Type 2. Para 3 1 SAB, G1. Previous eclampia and C section. [facility] currently 25 week  weeks [facility]  Lives with 25 yr old daughter. Has worked on cutting out junk food. Says her BS are starting to creep up a little bit even though she is eating the same. 100-160's. FBS 109, 130, 140, 89, 120  During the day randomly  164, 198,  Night time: 127, 130, 146, 130's. Drinking water and diluted juice. Still having a tough time with having an appetite and feels her food just sticks in her stomach.  She gets up in the middle of night hungry.  Trying some walking in the house.   Taking prenatal vitamins. Gong to OBGYN every 2 weeks.  Only testing twice a day now.. Having some morning sickness at times. Can't tolerated some foods early in am.  Preferred Learning Style:  No preference indicated   Learning Readiness:   Ready  Change in progress   MEDICATIONS:   DIETARY INTAKE:  24-hr recall:  B ( AM):  cherrios with milk, nuts, . Snk ( AM):  L ( PM): Hamburger and fries, regular dr peppers Snk ( PM):  D ( PM): ;PB sandwich with string cheese, water Snk ( PM):  Beverages: water,   Usual physical activity:ADL  Estimated energy needs: 1800  calories 200 g carbohydrates 125 g protein 50 g fat  Progress Towards Goal(s):  In progress.   Nutritional Diagnosis:  NB-1.1 Food and nutrition-related knowledge deficit As related to Daibetes Type and Prenant.  As evidenced by A1C 12% , [redacted] weeks gestation.    Intervention:  . Nutrition During Pregnancy, diabetes during pregnancy Nutrition and Diabetes education provided on My Plate, CHO counting, meal planning, portion sizes, timing of meals, avoiding snacks between meals unless having a low blood sugar, target ranges for A1C and blood sugars, signs/symptoms and treatment of hyper/hypoglycemia, monitoring  blood sugars, taking medications as prescribed, benefits of exercising 30 minutes per day and prevention of complications of DM.     Insulin dosing during pregnancy with Lantus instead of NPH.  11 weeks. . .7 units/kg for TDD TDD =  70 units  Am Dose (2/3 of TDD) = 46 units                1/3 of AM Dose for short acting =15 units                 2/3 of am dose will go for long acting insulin- Lantus at bedtime.   Pm Dosing (1/3) TDD = 23 units                1//2 of PM Dose is short acting 12 units                2/3 of pm dose is long acting- lantus at bedtime  Total amount of short acting is  27 units / 3 meals = 9 units Humalog with meals. (Currently taking 10 units with meals plus sliding scale BEFORE meals)    Goals 1. Stick to non sugar cereals Eat more vegetables.  2. Try PB sandwich instead of cereal for breakfast 3 Eat small snack between meals.  4. Keep drinking water 5.Try to eat small snack between meals  talk to DR. Fransico HimNIda about higher blood sugars in them morning.  Teaching Method Utilized:  Visual Auditory Hands on  Handouts given during visit include:  The Plate Method   Meal Plan Card   Barriers to learning/adherence to lifestyle change: none  Demonstrated degree of understanding via:  Teach Back   Monitoring/Evaluation:  Dietary intake, exercise, , and body weight in 2 week.Marland Kitchen

## 2019-01-20 NOTE — Telephone Encounter (Signed)
LeighAnn Leiland Mihelich, CMA  

## 2019-01-24 ENCOUNTER — Other Ambulatory Visit: Payer: Self-pay

## 2019-01-24 DIAGNOSIS — E1165 Type 2 diabetes mellitus with hyperglycemia: Secondary | ICD-10-CM

## 2019-01-24 MED ORDER — GLUCOSE BLOOD VI STRP: ORAL_STRIP | 2 refills | Status: DC

## 2019-01-27 ENCOUNTER — Encounter: Payer: Self-pay | Admitting: Nutrition

## 2019-01-27 ENCOUNTER — Ambulatory Visit: Payer: Medicaid Other | Admitting: "Endocrinology

## 2019-01-27 NOTE — Patient Instructions (Addendum)
Goals 1. Stick to non sugar cereals Eat more vegetables.  2. Try PB sandwich instead of cereal for breakfast 3 Eat small snack between meals.  4. Keep drinking water 5.Try to eat small snack between meals  talk to DR. Fransico Him about higher blood sugars in them morning.

## 2019-02-03 ENCOUNTER — Telehealth: Payer: Self-pay | Admitting: Nutrition

## 2019-02-03 ENCOUNTER — Ambulatory Visit: Payer: Medicaid Other | Admitting: Nutrition

## 2019-02-03 NOTE — Telephone Encounter (Signed)
No answer for phone visit. Will try again later.

## 2019-03-24 ENCOUNTER — Emergency Department (HOSPITAL_COMMUNITY)
Admission: EM | Admit: 2019-03-24 | Discharge: 2019-03-24 | Disposition: A | Discharge status: Home / Self Care | Payer: Medicaid Other | Attending: Emergency Medicine | Admitting: Emergency Medicine

## 2019-03-24 ENCOUNTER — Other Ambulatory Visit: Payer: Self-pay

## 2019-03-24 ENCOUNTER — Encounter (HOSPITAL_COMMUNITY): Payer: Self-pay | Admitting: Emergency Medicine

## 2019-03-24 DIAGNOSIS — Z3A35 35 weeks gestation of pregnancy: Secondary | ICD-10-CM | POA: Insufficient documentation

## 2019-03-24 DIAGNOSIS — Z91040 Latex allergy status: Secondary | ICD-10-CM | POA: Insufficient documentation

## 2019-03-24 DIAGNOSIS — O24414 Gestational diabetes mellitus in pregnancy, insulin controlled: Secondary | ICD-10-CM | POA: Diagnosis not present

## 2019-03-24 DIAGNOSIS — Z87891 Personal history of nicotine dependence: Secondary | ICD-10-CM | POA: Insufficient documentation

## 2019-03-24 DIAGNOSIS — O2343 Unspecified infection of urinary tract in pregnancy, third trimester: Secondary | ICD-10-CM | POA: Insufficient documentation

## 2019-03-24 DIAGNOSIS — Z880 Allergy status to penicillin: Secondary | ICD-10-CM | POA: Insufficient documentation

## 2019-03-24 DIAGNOSIS — Z79899 Other long term (current) drug therapy: Secondary | ICD-10-CM | POA: Insufficient documentation

## 2019-03-24 DIAGNOSIS — N390 Urinary tract infection, site not specified: Secondary | ICD-10-CM

## 2019-03-24 HISTORY — DX: Gestational diabetes mellitus in pregnancy, unspecified control: O24.419

## 2019-03-24 LAB — URINALYSIS, ROUTINE W REFLEX MICROSCOPIC
Bilirubin Urine: NEGATIVE
Glucose, UA: 50 mg/dL — AB
Hgb urine dipstick: NEGATIVE
Ketones, ur: NEGATIVE mg/dL
Nitrite: NEGATIVE
Protein, ur: 100 mg/dL — AB
Specific Gravity, Urine: 1.03 (ref 1.005–1.030)
pH: 5 (ref 5.0–8.0)

## 2019-03-24 LAB — CBG MONITORING, ED: Glucose-Capillary: 149 mg/dL — ABNORMAL HIGH (ref 70–99)

## 2019-03-24 MED ORDER — SODIUM CHLORIDE 0.9 % IV BOLUS
1000.0000 mL | Freq: Once | INTRAVENOUS | Status: AC
  Administered 2019-03-24: 22:00:00 1000 mL via INTRAVENOUS

## 2019-03-24 MED ORDER — NITROFURANTOIN MONOHYD MACRO 100 MG PO CAPS: 100.0000 mg | ORAL_CAPSULE | Freq: Two times a day (BID) | ORAL | 0 refills | Status: DC

## 2019-03-24 MED ORDER — NITROFURANTOIN MONOHYD MACRO 100 MG PO CAPS
100.0000 mg | ORAL_CAPSULE | Freq: Two times a day (BID) | ORAL | Status: DC
  Administered 2019-03-24: 100 mg via ORAL
  Filled 2019-03-24: qty 1

## 2019-03-24 NOTE — ED Triage Notes (Signed)
Pt states she was visiting a friend and started having contractions around 3pm. Pt states this is her 3rd pregnancy but 2nd child. Denies any fluid but feels a lot of pressure in her vaginal area. Pt states she is scheduled for a c-section July 16. Due date is July 29.

## 2019-03-24 NOTE — ED Notes (Signed)
Women's stated no contractions being picked up on the monitor at this time. They also stated that the baby's heart rate is normal in the 140 range.

## 2019-03-24 NOTE — Discharge Instructions (Addendum)
Increase fluids, rest, follow-up with ob gyn tomorrow.  We will start antibiotics for a possible urinary tract infection.

## 2019-03-24 NOTE — ED Notes (Signed)
Patient on fetal monitor. [facility] Rapid Response contacted for monitoring.
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
## 2019-03-24 NOTE — Progress Notes (Signed)
Pt is a G3P1 @ 35wk3da gestation. She is at [facility] ED with complaints of contractions.   Fetal heart tones 145bpm, good variability, multiple 15x15 accelerations, no decelerations. No contractions seen on tracings.   Patient has a history of preeclampsia and a previous C/S. Current blood pressures 143/96.  Discussed with Dr Elly Modena-- fetal heart tones, no contractions, current BP's; history of Preeclampsia and C-section.  Dr Elly Modena recommended drawing labs, and further evaluation of patient by obstetrician. Advised Rexene Edison RN of this and provided Dr Domenic Schwab phone number for ED physician.

## 2019-03-24 NOTE — ED Notes (Signed)
Blood glucose 149

## 2019-03-24 NOTE — ED Notes (Signed)
Pt ambulatory to waiting room. Pt verbalized understanding of discharge instructions.   

## 2019-03-26 LAB — URINE CULTURE

## 2019-04-03 DIAGNOSIS — O1490 Unspecified pre-eclampsia, unspecified trimester: Secondary | ICD-10-CM

## 2019-05-22 ENCOUNTER — Other Ambulatory Visit: Payer: Self-pay | Admitting: "Endocrinology

## 2019-05-22 DIAGNOSIS — E1165 Type 2 diabetes mellitus with hyperglycemia: Secondary | ICD-10-CM

## 2019-07-18 ENCOUNTER — Encounter (HOSPITAL_COMMUNITY): Payer: Self-pay

## 2019-08-23 ENCOUNTER — Other Ambulatory Visit: Payer: Self-pay | Admitting: *Deleted

## 2019-08-23 DIAGNOSIS — Z20822 Contact with and (suspected) exposure to covid-19: Secondary | ICD-10-CM

## 2019-08-24 LAB — NOVEL CORONAVIRUS, NAA: SARS-CoV-2, NAA: NOT DETECTED

## 2019-09-26 ENCOUNTER — Other Ambulatory Visit: Payer: Self-pay | Admitting: "Endocrinology

## 2019-09-26 DIAGNOSIS — E1165 Type 2 diabetes mellitus with hyperglycemia: Secondary | ICD-10-CM

## 2019-09-27 ENCOUNTER — Other Ambulatory Visit: Payer: Self-pay

## 2019-12-21 ENCOUNTER — Other Ambulatory Visit: Payer: Self-pay | Admitting: "Endocrinology

## 2019-12-21 DIAGNOSIS — E1165 Type 2 diabetes mellitus with hyperglycemia: Secondary | ICD-10-CM

## 2020-03-05 ENCOUNTER — Other Ambulatory Visit: Payer: Self-pay | Admitting: "Endocrinology

## 2020-03-05 DIAGNOSIS — E1165 Type 2 diabetes mellitus with hyperglycemia: Secondary | ICD-10-CM

## 2020-04-02 ENCOUNTER — Other Ambulatory Visit: Payer: Self-pay | Admitting: "Endocrinology

## 2020-08-29 ENCOUNTER — Other Ambulatory Visit: Payer: Self-pay | Admitting: "Endocrinology

## 2020-08-30 ENCOUNTER — Other Ambulatory Visit: Payer: Self-pay | Admitting: "Endocrinology

## 2021-05-17 ENCOUNTER — Encounter: Payer: Self-pay | Admitting: Adult Health

## 2021-05-17 ENCOUNTER — Ambulatory Visit (INDEPENDENT_AMBULATORY_CARE_PROVIDER_SITE_OTHER): Payer: Medicaid Other | Admitting: Adult Health

## 2021-05-17 ENCOUNTER — Other Ambulatory Visit: Payer: Self-pay

## 2021-05-17 VITALS — BP 125/78 | HR 82 | Ht 62.0 in | Wt 229.0 lb

## 2021-05-17 DIAGNOSIS — O3680X0 Pregnancy with inconclusive fetal viability, not applicable or unspecified: Secondary | ICD-10-CM

## 2021-05-17 DIAGNOSIS — Z3A01 Less than 8 weeks gestation of pregnancy: Secondary | ICD-10-CM

## 2021-05-17 DIAGNOSIS — O09299 Supervision of pregnancy with other poor reproductive or obstetric history, unspecified trimester: Secondary | ICD-10-CM

## 2021-05-17 DIAGNOSIS — O09899 Supervision of other high risk pregnancies, unspecified trimester: Secondary | ICD-10-CM | POA: Diagnosis not present

## 2021-05-17 DIAGNOSIS — Z3201 Encounter for pregnancy test, result positive: Secondary | ICD-10-CM

## 2021-05-17 LAB — POCT URINE PREGNANCY: Preg Test, Ur: POSITIVE — AB

## 2021-05-17 NOTE — Progress Notes (Signed)
  Subjective:     Patient ID: Claire Harrison, female   DOB: 07/18/1996, 25 y.o.   MRN: [id number]  HPI Claire Harrison is a 25 year old Hispanic female, married, [id number], in for UPT, has missed period and had 4+HPTs.  Review of Systems +missed period, with 4+HPTs Reviewed past medical,surgical, social and family history. Reviewed medications and allergies.     Objective:   Physical Exam BP 125/78 (BP Location: Left Arm, Patient Position: Sitting, Cuff Size: Large)   Pulse 82   Ht 5\' 2"  (1.575 m)   Wt 229 lb (103.9 kg)   LMP 03/26/2021   Breastfeeding No   BMI 41.88 kg/m  UPT is +, about 7+3, weeks by LPM with EDD 12/31/21.   Skin warm and dry. Neck: mid line trachea, normal thyroid, good ROM, no lymphadenopathy noted. Lungs: clear to ausculation bilaterally. Cardiovascular: regular rate and rhythm. Abdomen is soft and non tender AA is 1 Fall risk is low Depression screen [facility] 2/9 05/17/2021 10/06/2018 10/13/2016  Decreased Interest 1 3 0  Down, Depressed, Hopeless 0 3 1  PHQ - 2 Score 1 6 1   Altered sleeping 1 - 0  Tired, decreased energy 2 3 0  Change in appetite 0 0 0  Feeling bad or failure about yourself  0 2 0  Trouble concentrating 0 0 0  Moving slowly or fidgety/restless 0 0 0  Suicidal thoughts 0 0 0  PHQ-9 Score 4 - 1  Difficult doing work/chores - Very difficult -    GAD 7 : Generalized Anxiety Score 05/17/2021  Nervous, Anxious, on Edge 1  Control/stop worrying 1  Worry too much - different things 0  Trouble relaxing 0  Restless 0  Easily annoyed or irritable 1  Afraid - awful might happen 0  Total GAD 7 Score 3      Upstream - 05/17/21 05/19/2021       Pregnancy Intention Screening   Does the patient want to become pregnant in the next year? N/A    Does the patient's partner want to become pregnant in the next year? N/A    Would the patient like to discuss contraceptive options today? N/A      Contraception Wrap Up   Current Method Pregnant/Seeking Pregnancy    End  Method Pregnant/Seeking Pregnancy    Contraception Counseling Provided No             Assessment:     1. Pregnancy examination or test, positive result Take PNV  2. Encounter to determine fetal viability of pregnancy, single or unspecified fetus Return in 3 weeks for dating SU  3. Less than [redacted] weeks gestation of pregnancy   4. History of preterm delivery, currently pregnant Request records from [place]  5. History of pre-eclampsia in prior pregnancy, currently pregnant.also had gestational diabetes 81 mg ASA after first trimester   6. History of postpartum hemorrhage, currently pregnant Request records from [place]    Plan:     Review handout by [facility]

## 2021-06-07 ENCOUNTER — Ambulatory Visit (INDEPENDENT_AMBULATORY_CARE_PROVIDER_SITE_OTHER): Payer: Medicaid Other

## 2021-06-07 ENCOUNTER — Other Ambulatory Visit: Payer: Self-pay

## 2021-06-07 DIAGNOSIS — Z3A10 10 weeks gestation of pregnancy: Secondary | ICD-10-CM

## 2021-06-07 DIAGNOSIS — O3680X0 Pregnancy with inconclusive fetal viability, not applicable or unspecified: Secondary | ICD-10-CM | POA: Diagnosis not present

## 2021-06-07 NOTE — Progress Notes (Signed)
[place] 10+3 wks,single IUP,CRL 32.26 mm,fhr 171 bpm,normal ovaries

## 2021-06-17 ENCOUNTER — Telehealth: Payer: Self-pay | Admitting: Women's Health

## 2021-06-17 ENCOUNTER — Other Ambulatory Visit: Payer: Self-pay | Admitting: Women's Health

## 2021-06-17 MED ORDER — DOXYLAMINE-PYRIDOXINE 10-10 MG PO TBEC: DELAYED_RELEASE_TABLET | ORAL | 6 refills | Status: DC

## 2021-06-17 NOTE — Telephone Encounter (Signed)
Patient calling stating that she about 12 wks and is not able to keep anything down, and [place] wanting to know if anything could be sent to pharmacy [facility] [place]

## 2021-06-17 NOTE — Telephone Encounter (Signed)
Called pt to inform her of nausea med prescription sent in for her. Two identifiers used. Pt confirmed understanding.

## 2021-06-21 ENCOUNTER — Other Ambulatory Visit: Payer: Self-pay | Admitting: Obstetrics & Gynecology

## 2021-06-21 DIAGNOSIS — Z3682 Encounter for antenatal screening for nuchal translucency: Secondary | ICD-10-CM

## 2021-06-24 ENCOUNTER — Other Ambulatory Visit: Payer: Medicaid Other

## 2021-07-01 ENCOUNTER — Other Ambulatory Visit: Payer: Self-pay

## 2021-07-01 ENCOUNTER — Ambulatory Visit (INDEPENDENT_AMBULATORY_CARE_PROVIDER_SITE_OTHER): Payer: Medicaid Other | Admitting: Women's Health

## 2021-07-01 ENCOUNTER — Ambulatory Visit: Payer: Medicaid Other | Admitting: *Deleted

## 2021-07-01 ENCOUNTER — Encounter: Payer: Self-pay | Admitting: Women's Health

## 2021-07-01 VITALS — BP 112/68 | HR 81 | Wt 234.0 lb

## 2021-07-01 DIAGNOSIS — Z3A13 13 weeks gestation of pregnancy: Secondary | ICD-10-CM | POA: Diagnosis not present

## 2021-07-01 DIAGNOSIS — O09299 Supervision of pregnancy with other poor reproductive or obstetric history, unspecified trimester: Secondary | ICD-10-CM | POA: Insufficient documentation

## 2021-07-01 DIAGNOSIS — Z8759 Personal history of other complications of pregnancy, childbirth and the puerperium: Secondary | ICD-10-CM | POA: Insufficient documentation

## 2021-07-01 DIAGNOSIS — E119 Type 2 diabetes mellitus without complications: Secondary | ICD-10-CM

## 2021-07-01 DIAGNOSIS — Z98891 History of uterine scar from previous surgery: Secondary | ICD-10-CM | POA: Insufficient documentation

## 2021-07-01 DIAGNOSIS — O0990 Supervision of high risk pregnancy, unspecified, unspecified trimester: Secondary | ICD-10-CM | POA: Insufficient documentation

## 2021-07-01 DIAGNOSIS — O0991 Supervision of high risk pregnancy, unspecified, first trimester: Secondary | ICD-10-CM

## 2021-07-01 LAB — POCT URINALYSIS DIPSTICK OB
Blood, UA: NEGATIVE
Ketones, UA: NEGATIVE
Leukocytes, UA: NEGATIVE
Nitrite, UA: NEGATIVE
POC,PROTEIN,UA: NEGATIVE

## 2021-07-01 MED ORDER — BLOOD PRESSURE MONITOR MISC: 0 refills | Status: DC

## 2021-07-01 MED ORDER — HUMALOG KWIKPEN 100 UNIT/ML ~~LOC~~ SOPN: 8.0000 [IU] | PEN_INJECTOR | Freq: Three times a day (TID) | SUBCUTANEOUS | 99 refills | Status: DC

## 2021-07-01 MED ORDER — ASPIRIN 81 MG PO TBEC: 162.0000 mg | DELAYED_RELEASE_TABLET | Freq: Every day | ORAL | 2 refills | Status: DC

## 2021-07-01 NOTE — Progress Notes (Signed)
INITIAL OBSTETRICAL VISIT Patient name: Claire Harrison MRN [id number]  Date of birth: Mar 05, 1996 Chief Complaint:   Initial Prenatal Visit  History of Present Illness:   Claire Harrison is a 25 y.o. [id number] Hispanic female at [redacted]w[redacted]d by LMP c/w u/s at 10 weeks with an Estimated Date of Delivery: 12/31/21 being seen today for her initial obstetrical visit.   Patient's last menstrual period was 03/26/2021. Her obstetrical history is significant for  SAB x 1, C/S x 2- 1st @ 37wks d/t pre-e and fetal macrosomia, 2nd ERCS w/ pre-e at 36.5wk- delayed PPH, per pt Bakri balloon and 2uPRBC  .   Today she reports  n/v, diclegis helps. . DM- taking Lantus 60u qhs, Humalog sliding scale w/ meals, A1C 7.7 (06/26/21)  Last pap 06/19/20. Results were: NILM w/ HRHPV not done  Depression screen [facility] 2/9 07/01/2021 05/17/2021 10/06/2018 10/13/2016 09/15/2016  Decreased Interest 1 1 3  0 0  Down, Depressed, Hopeless 1 0 3 1 2   PHQ - 2 Score 2 1 6 1 2   Altered sleeping 2 1 - 0 0  Tired, decreased energy 2 2 3  0 0  Change in appetite 1 0 0 0 0  Feeling bad or failure about yourself  0 0 2 0 0  Trouble concentrating 0 0 0 0 0  Moving slowly or fidgety/restless 0 0 0 0 0  Suicidal thoughts 0 0 0 0 0  PHQ-9 Score 7 4 - 1 2  Difficult doing work/chores - - Very difficult - -     GAD 7 : Generalized Anxiety Score 07/01/2021 05/17/2021  Nervous, Anxious, on Edge 2 1  Control/stop worrying 1 1  Worry too much - different things 1 0  Trouble relaxing 1 0  Restless 0 0  Easily annoyed or irritable 1 1  Afraid - awful might happen 1 0  Total GAD 7 Score 7 3     Review of Systems:   Pertinent items are noted in HPI Denies cramping/contractions, leakage of fluid, vaginal bleeding, abnormal vaginal discharge w/ itching/odor/irritation, headaches, visual changes, shortness of breath, chest pain, abdominal pain, severe nausea/vomiting, or problems with urination or bowel movements unless otherwise stated above.   Pertinent History Reviewed:  Reviewed past medical,surgical, social, obstetrical and family history.  Reviewed problem list, medications and allergies. OB History  Gravida Para Term Preterm AB Living  4 2 1 1 1 2   SAB IAB Ectopic Multiple Live Births  1       2    # Outcome Date GA Lbr Len/2nd Weight Sex Delivery Anes PTL Lv  4 Current           3 Preterm 04/03/19 [redacted]w[redacted]d  8 lb (3.629 kg) M CS-LTranv Spinal N LIV     Complications: Pre-eclampsia, Diabetes mellitus  2 Term 04/29/17 [redacted]w[redacted]d  10 lb 1 oz (4.564 kg) F CS-LTranv Spinal N LIV     Complications: Pre-eclampsia, Diabetes mellitus  1 SAB 07/29/16 [redacted]w[redacted]d          Physical Assessment:   Vitals:   07/01/21 1051  BP: 112/68  Pulse: 81  Weight: 234 lb (106.1 kg)  Body mass index is 42.8 kg/m.       Physical Examination:  General appearance - well appearing, and in no distress  Mental status - alert, oriented to person, place, and time  Psych:  She has a normal mood and affect  Skin - warm and dry, normal color, no suspicious lesions noted  Chest -  effort normal, all lung fields clear to auscultation bilaterally  Heart - normal rate and regular rhythm  Abdomen - soft, nontender  Extremities:  No swelling or varicosities noted  Thin prep pap is not done   Chaperone: N/A    TODAY'S FHR: +FCA and active fetus on informal TA u/s  Results for orders placed or performed in visit on 07/01/21 (from the past 24 hour(s))  POC Urinalysis Dipstick OB   Collection Time: 07/01/21 11:36 AM  Result Value Ref Range   Color, UA     Clarity, UA     Glucose, UA     Bilirubin, UA     Ketones, UA neg    Spec Grav, UA     Blood, UA neg    pH, UA     POC,PROTEIN,UA Negative Negative, Trace, Small (1+), Moderate (2+), Large (3+), 4+   Urobilinogen, UA     Nitrite, UA neg    Leukocytes, UA Negative Negative   Appearance     Odor      Assessment & Plan:  1) High-Risk Pregnancy [id number] at [redacted]w[redacted]d with an Estimated Date of Delivery:  12/31/21   2) Initial OB visit  3) T2DM-IDDM> per LHE, Humalog 8u TID w/ meals, Lantus 60u qhs, f/u 2wks and bring log. Normal eye exam earlier this year per pt. ASA   4) H/O pre-e x2> baseline labs today, ASA 162mg   5) Prev c/s x 2> considering TOLAC  6) H/O PPH> requiring Bakri balloon and 2uPRBC, records requested  Meds:  Meds ordered this encounter  Medications   Blood Pressure Monitor MISC    Sig: For regular home bp monitoring during pregnancy    Dispense:  1 each    Refill:  0    O09.91 Please mail to patient   aspirin 81 MG EC tablet    Sig: Take 2 tablets (162 mg total) by mouth daily. Swallow whole.    Dispense:  180 tablet    Refill:  2    Order Specific Question:   Supervising Provider    Answer:   , LUTHER H [[id number]]   HUMALOG KWIKPEN 100 UNIT/ML KwikPen    Sig: Inject 8 Units into the skin with breakfast, with lunch, and with evening meal.    Dispense:  15 mL    Refill:  PRN    Order Specific Question:   Supervising Provider    Answer:   Despina Hidden H [[id number]]    Initial labs obtained Continue prenatal vitamins Reviewed n/v relief measures and warning s/s to report Reviewed recommended weight gain based on pre-gravid BMI Encouraged well-balanced diet Genetic & carrier screening discussed: requests Panorama, AFP, and Horizon , didn't come for NT u/s Ultrasound discussed; fetal survey: requested CCNC completed> form faxed if has or is planning to apply for medicaid The nature of [facility] with multiple MDs and other Advanced Practice Providers was explained to patient; also emphasized that fellows, residents, and students are part of our team. Does not have home bp cuff. Office bp cuff given: no. Rx sent: yes. Check bp weekly, let [employer] know if consistently >140/90.   Follow-up: Return in about 2 weeks (around 07/15/2021) for get op note and d/c summary from 2020 delivery please;, HROB, MD only, in person.   Orders Placed This  Encounter  Procedures   Urine Culture   GC/Chlamydia Probe Amp   Pain Management Screening Profile (10S)   CBC/D/Plt+RPR+Rh+ABO+RubIgG...   Comprehensive metabolic panel  Protein / creatinine ratio, urine   Genetic Screening   TSH   POC Urinalysis Dipstick OB    Cheral Marker CNM, [facility] 07/01/2021 12:47 PM

## 2021-07-01 NOTE — Patient Instructions (Addendum)
[place], thank you for choosing our office today! We appreciate the opportunity to meet your healthcare needs. You may receive a short survey by mail, e-mail, or through [employer]. If you are happy with your care we would appreciate if you could take just a few minutes to complete the survey questions. We read all of your comments and take your feedback very seriously. Thank you again for choosing our office.  [facility] [employer] Team at [facility]  [facility] at [facility] ([address] [place], [place] [address]) Entrance C, located off of [address] valet parking   Check blood sugars 4 times a day: in the morning before eating/drinking anything (goal is <95) and 2 hours after your first bite of breakfast, lunch, and supper (goal is <120). Please keep a log (Example 02/25/21: 95, 120, 120, 120) and bring to each appointment.    Nausea & Vomiting Have saltine crackers or pretzels by your bed and eat a few bites before you raise your head out of bed in the morning Eat small frequent meals throughout the day instead of large meals Drink plenty of fluids throughout the day to stay hydrated, just don't drink a lot of fluids with your meals.  This can make your stomach fill up faster making you feel sick Do not brush your teeth right after you eat Products with real ginger are good for nausea, like ginger ale and ginger hard candy Make sure it says made with real ginger! Sucking on sour candy like lemon heads is also good for nausea If your prenatal vitamins make you nauseated, take them at night so you will sleep through the nausea Sea Bands If you feel like you need medicine for the nausea & vomiting please let us know If you are unable to keep any fluids or food down please let us know   Constipation Drink plenty of fluid, preferably water, throughout the day Eat foods high in fiber such as fruits, vegetables, and grains Exercise, such as walking, is a  good way to keep your bowels regular Drink warm fluids, especially warm prune juice, or decaf coffee Eat a 1/2 cup of real oatmeal (not instant), 1/2 cup applesauce, and 1/2-1 cup warm prune juice every day If needed, you may take Colace (docusate sodium) stool softener once or twice a day to help keep the stool soft.  If you still are having problems with constipation, you may take Miralax once daily as needed to help keep your bowels regular.   Home Blood Pressure Monitoring for Patients   Your provider has recommended that you check your blood pressure (BP) at least once a week at home. If you do not have a blood pressure cuff at home, one will be provided for you. Contact your provider if you have not received your monitor within 1 week.   Helpful Tips for Accurate Home Blood Pressure Checks  Don't smoke, exercise, or drink caffeine 30 minutes before checking your BP Use the restroom before checking your BP (a full bladder can raise your pressure) Relax in a comfortable upright chair Feet on the ground Left arm resting comfortably on a flat surface at the level of your heart Legs uncrossed Back supported Sit quietly and don't talk Place the cuff on your bare arm Adjust snuggly, so that only two fingertips can fit between your skin and the top of the cuff Check 2 readings separated by at least one minute Keep a log  of your BP readings For a visual, please reference this diagram: http://ccnc.care/bpdiagram  Provider Name: [facility]     Phone: [phone]  Zone 1: ALL CLEAR  Continue to monitor your symptoms:  BP reading is less than 140 (top number) or less than 90 (bottom number)  No right upper stomach pain No headaches or seeing spots No feeling nauseated or throwing up No swelling in face and hands  Zone 2: CAUTION Call your doctor's office for any of the following:  BP reading is greater than 140 (top number) or greater than 90 (bottom number)  Stomach pain  under your ribs in the middle or right side Headaches or seeing spots Feeling nauseated or throwing up Swelling in face and hands  Zone 3: EMERGENCY  Seek immediate medical care if you have any of the following:  BP reading is greater than160 (top number) or greater than 110 (bottom number) Severe headaches not improving with Tylenol Serious difficulty catching your breath Any worsening symptoms from Zone 2    First Trimester of Pregnancy The first trimester of pregnancy is from week 1 until the end of week 12 (months 1 through 3). A week after a sperm fertilizes an egg, the egg will implant on the wall of the uterus. This embryo will begin to develop into a baby. Genes from you and your partner are forming the baby. The female genes determine whether the baby is a boy or a girl. At 6-8 weeks, the eyes and face are formed, and the heartbeat can be seen on ultrasound. At the end of 12 weeks, all the baby's organs are formed.  Now that you are pregnant, you will want to do everything you can to have a healthy baby. Two of the most important things are to get good prenatal care and to follow your health care provider's instructions. Prenatal care is all the medical care you receive before the baby's birth. This care will help prevent, find, and treat any problems during the pregnancy and childbirth. BODY CHANGES Your body goes through many changes during pregnancy. The changes vary from woman to woman.  You may gain or lose a couple of pounds at first. You may feel sick to your stomach (nauseous) and throw up (vomit). If the vomiting is uncontrollable, call your health care provider. You may tire easily. You may develop headaches that can be relieved by medicines approved by your health care provider. You may urinate more often. Painful urination may mean you have a bladder infection. You may develop heartburn as a result of your pregnancy. You may develop constipation because certain hormones are  causing the muscles that push waste through your intestines to slow down. You may develop hemorrhoids or swollen, bulging veins (varicose veins). Your breasts may begin to grow larger and become tender. Your nipples may stick out more, and the tissue that surrounds them (areola) may become darker. Your gums may bleed and may be sensitive to brushing and flossing. Dark spots or blotches (chloasma, mask of pregnancy) may develop on your face. This will likely fade after the baby is born. Your menstrual periods will stop. You may have a loss of appetite. You may develop cravings for certain kinds of food. You may have changes in your emotions from day to day, such as being excited to be pregnant or being concerned that something may go wrong with the pregnancy and baby. You may have more vivid and strange dreams. You may have changes in your hair.  These can include thickening of your hair, rapid growth, and changes in texture. Some women also have hair loss during or after pregnancy, or hair that feels dry or thin. Your hair will most likely return to normal after your baby is born. WHAT TO EXPECT AT YOUR PRENATAL VISITS During a routine prenatal visit: You will be weighed to make sure you and the baby are growing normally. Your blood pressure will be taken. Your abdomen will be measured to track your baby's growth. The fetal heartbeat will be listened to starting around week 10 or 12 of your pregnancy. Test results from any previous visits will be discussed. Your health care provider may ask you: How you are feeling. If you are feeling the baby move. If you have had any abnormal symptoms, such as leaking fluid, bleeding, severe headaches, or abdominal cramping. If you have any questions. Other tests that may be performed during your first trimester include: Blood tests to find your blood type and to check for the presence of any previous infections. They will also be used to check for low iron  levels (anemia) and Rh antibodies. Later in the pregnancy, blood tests for diabetes will be done along with other tests if problems develop. Urine tests to check for infections, diabetes, or protein in the urine. An ultrasound to confirm the proper growth and development of the baby. An amniocentesis to check for possible genetic problems. Fetal screens for spina bifida and Down syndrome. You may need other tests to make sure you and the baby are doing well. HOME CARE INSTRUCTIONS  Medicines Follow your health care provider's instructions regarding medicine use. Specific medicines may be either safe or unsafe to take during pregnancy. Take your prenatal vitamins as directed. If you develop constipation, try taking a stool softener if your health care provider approves. Diet Eat regular, well-balanced meals. Choose a variety of foods, such as meat or vegetable-based protein, fish, milk and low-fat dairy products, vegetables, fruits, and whole grain breads and cereals. Your health care provider will help you determine the amount of weight gain that is right for you. Avoid raw meat and uncooked cheese. These carry germs that can cause birth defects in the baby. Eating four or five small meals rather than three large meals a day may help relieve nausea and vomiting. If you start to feel nauseous, eating a few soda crackers can be helpful. Drinking liquids between meals instead of during meals also seems to help nausea and vomiting. If you develop constipation, eat more high-fiber foods, such as fresh vegetables or fruit and whole grains. Drink enough fluids to keep your urine clear or pale yellow. Activity and Exercise Exercise only as directed by your health care provider. Exercising will help you: Control your weight. Stay in shape. Be prepared for labor and delivery. Experiencing pain or cramping in the lower abdomen or low back is a good sign that you should stop exercising. Check with your  health care provider before continuing normal exercises. Try to avoid standing for long periods of time. Move your legs often if you must stand in one place for a long time. Avoid heavy lifting. Wear low-heeled shoes, and practice good posture. You may continue to have sex unless your health care provider directs you otherwise. Relief of Pain or Discomfort Wear a good support bra for breast tenderness.   Take warm sitz baths to soothe any pain or discomfort caused by hemorrhoids. Use hemorrhoid cream if your health care provider approves.  Rest with your legs elevated if you have leg cramps or low back pain. If you develop varicose veins in your legs, wear support hose. Elevate your feet for 15 minutes, 3-4 times a day. Limit salt in your diet. Prenatal Care Schedule your prenatal visits by the twelfth week of pregnancy. They are usually scheduled monthly at first, then more often in the last 2 months before delivery. Write down your questions. Take them to your prenatal visits. Keep all your prenatal visits as directed by your health care provider. Safety Wear your seat belt at all times when driving. Make a list of emergency phone numbers, including numbers for family, friends, the hospital, and police and fire departments. General Tips Ask your health care provider for a referral to a local prenatal education class. Begin classes no later than at the beginning of month 6 of your pregnancy. Ask for help if you have counseling or nutritional needs during pregnancy. Your health care provider can offer advice or refer you to specialists for help with various needs. Do not use hot tubs, steam rooms, or saunas. Do not douche or use tampons or scented sanitary pads. Do not cross your legs for long periods of time. Avoid cat litter boxes and soil used by cats. These carry germs that can cause birth defects in the baby and possibly loss of the fetus by miscarriage or stillbirth. Avoid all smoking,  herbs, alcohol, and medicines not prescribed by your health care provider. Chemicals in these affect the formation and growth of the baby. Schedule a dentist appointment. At home, brush your teeth with a soft toothbrush and be gentle when you floss. SEEK MEDICAL CARE IF:  You have dizziness. You have mild pelvic cramps, pelvic pressure, or nagging pain in the abdominal area. You have persistent nausea, vomiting, or diarrhea. You have a bad smelling vaginal discharge. You have pain with urination. You notice increased swelling in your face, hands, legs, or ankles. SEEK IMMEDIATE MEDICAL CARE IF:  You have a fever. You are leaking fluid from your vagina. You have spotting or bleeding from your vagina. You have severe abdominal cramping or pain. You have rapid weight gain or loss. You vomit blood or material that looks like coffee grounds. You are exposed to [place] measles and have never had them. You are exposed to fifth disease or chickenpox. You develop a severe headache. You have shortness of breath. You have any kind of trauma, such as from a fall or a car accident. Document Released: 09/09/2001 Document Revised: 01/30/2014 Document Reviewed: 07/26/2013 [facility] Patient Information 2015 [place], [place]. This information is not intended to replace advice given to you by your health care provider. Make sure you discuss any questions you have with your health care provider.

## 2021-07-02 LAB — PMP SCREEN PROFILE (10S), URINE
Amphetamine Scrn, Ur: NEGATIVE ng/mL
BARBITURATE SCREEN URINE: NEGATIVE ng/mL
BENZODIAZEPINE SCREEN, URINE: NEGATIVE ng/mL
CANNABINOIDS UR QL SCN: NEGATIVE ng/mL
Cocaine (Metab) Scrn, Ur: NEGATIVE ng/mL
Creatinine(Crt), U: 208.3 mg/dL (ref 20.0–300.0)
Methadone Screen, Urine: NEGATIVE ng/mL
OXYCODONE+OXYMORPHONE UR QL SCN: NEGATIVE ng/mL
Opiate Scrn, Ur: NEGATIVE ng/mL
Ph of Urine: 5.5 (ref 4.5–8.9)
Phencyclidine Qn, Ur: NEGATIVE ng/mL
Propoxyphene Scrn, Ur: NEGATIVE ng/mL

## 2021-07-02 LAB — CBC/D/PLT+RPR+RH+ABO+RUBIGG...
Antibody Screen: NEGATIVE
Basophils Absolute: 0 10*3/uL (ref 0.0–0.2)
Basos: 0 %
EOS (ABSOLUTE): 0.2 10*3/uL (ref 0.0–0.4)
Eos: 2 %
HCV Ab: <0.1 s/co ratio (ref 0.0–0.9)
HIV Screen 4th Generation wRfx: NONREACTIVE
Hematocrit: 39.4 % (ref 34.0–46.6)
Hemoglobin: 13 g/dL (ref 11.1–15.9)
Hepatitis B Surface Ag: NEGATIVE
Immature Grans (Abs): 0.1 10*3/uL (ref 0.0–0.1)
Immature Granulocytes: 1 %
Lymphocytes Absolute: 2.8 10*3/uL (ref 0.7–3.1)
Lymphs: 22 %
MCH: 27 pg (ref 26.6–33.0)
MCHC: 33 g/dL (ref 31.5–35.7)
MCV: 82 fL (ref 79–97)
Monocytes Absolute: 0.6 10*3/uL (ref 0.1–0.9)
Monocytes: 5 %
Neutrophils Absolute: 8.8 10*3/uL — ABNORMAL HIGH (ref 1.4–7.0)
Neutrophils: 70 %
Platelets: 334 10*3/uL (ref 150–450)
RBC: 4.82 x10E6/uL (ref 3.77–5.28)
RDW: 12.1 % (ref 11.7–15.4)
RPR Ser Ql: NONREACTIVE
Rh Factor: POSITIVE
Rubella Antibodies, IGG: 3.88 index (ref >0.99)
WBC: 12.4 10*3/uL — ABNORMAL HIGH (ref 3.4–10.8)

## 2021-07-02 LAB — COMPREHENSIVE METABOLIC PANEL
ALT: 12 IU/L (ref 0–32)
AST: 12 IU/L (ref 0–40)
Albumin/Globulin Ratio: 1.5 (ref 1.2–2.2)
Albumin: 4.1 g/dL (ref 3.9–5.0)
Alkaline Phosphatase: 80 IU/L (ref 44–121)
BUN/Creatinine Ratio: 23 (ref 9–23)
BUN: 11 mg/dL (ref 6–20)
Bilirubin Total: <0.2 mg/dL (ref 0.0–1.2)
CO2: 21 mmol/L (ref 20–29)
Calcium: 9.6 mg/dL (ref 8.7–10.2)
Chloride: 103 mmol/L (ref 96–106)
Creatinine, Ser: 0.47 mg/dL — ABNORMAL LOW (ref 0.57–1.00)
Globulin, Total: 2.8 g/dL (ref 1.5–4.5)
Glucose: 77 mg/dL (ref 70–99)
Potassium: 4.4 mmol/L (ref 3.5–5.2)
Sodium: 138 mmol/L (ref 134–144)
Total Protein: 6.9 g/dL (ref 6.0–8.5)
eGFR: 134 mL/min/{1.73_m2} (ref >59)

## 2021-07-02 LAB — HCV INTERPRETATION

## 2021-07-02 LAB — PROTEIN / CREATININE RATIO, URINE
Creatinine, Urine: 197 mg/dL
Protein, Ur: 26.7 mg/dL
Protein/Creat Ratio: 136 mg/g creat (ref 0–200)

## 2021-07-02 LAB — TSH: TSH: 2.13 u[IU]/mL (ref 0.450–4.500)

## 2021-07-03 ENCOUNTER — Telehealth: Payer: Self-pay | Admitting: Women's Health

## 2021-07-03 NOTE — Telephone Encounter (Signed)
Sugar is at 76 (fasting) breakfast & lunch (93   &98). She wants to know if she still needs to take the 8 units today. She said she can't get on her mychart to check messages, so it would be best to call her.

## 2021-07-03 NOTE — Telephone Encounter (Signed)
Attempted to call patient back.  No answer.

## 2021-07-04 LAB — URINE CULTURE

## 2021-07-05 LAB — GC/CHLAMYDIA PROBE AMP
Chlamydia trachomatis, NAA: NEGATIVE
Neisseria Gonorrhoeae by PCR: NEGATIVE

## 2021-07-08 ENCOUNTER — Encounter: Payer: Self-pay | Admitting: Women's Health

## 2021-07-11 ENCOUNTER — Telehealth: Payer: Self-pay | Admitting: Obstetrics & Gynecology

## 2021-07-11 NOTE — Telephone Encounter (Signed)
Claire Harrison stated that she fell last night and was seen at [facility]. The doctors there ran some labs and listened to the baby's heartbeat and said everything was fine, but she says she is still having some pain under her pelvic area. She has her mychart running again, so you can message her on that.

## 2021-07-12 ENCOUNTER — Encounter: Payer: Self-pay | Admitting: Women's Health

## 2021-07-16 ENCOUNTER — Other Ambulatory Visit: Payer: Self-pay

## 2021-07-16 ENCOUNTER — Encounter: Payer: Self-pay | Admitting: Women's Health

## 2021-07-16 ENCOUNTER — Ambulatory Visit (INDEPENDENT_AMBULATORY_CARE_PROVIDER_SITE_OTHER): Payer: Medicaid Other | Admitting: Women's Health

## 2021-07-16 VITALS — BP 116/66 | HR 84 | Wt 232.0 lb

## 2021-07-16 DIAGNOSIS — E119 Type 2 diabetes mellitus without complications: Secondary | ICD-10-CM

## 2021-07-16 DIAGNOSIS — O0992 Supervision of high risk pregnancy, unspecified, second trimester: Secondary | ICD-10-CM

## 2021-07-16 DIAGNOSIS — Z1379 Encounter for other screening for genetic and chromosomal anomalies: Secondary | ICD-10-CM

## 2021-07-16 LAB — POCT URINALYSIS DIPSTICK OB
Blood, UA: NEGATIVE
Glucose, UA: NEGATIVE
Ketones, UA: NEGATIVE
Leukocytes, UA: NEGATIVE
Nitrite, UA: NEGATIVE
POC,PROTEIN,UA: NEGATIVE

## 2021-07-16 NOTE — Progress Notes (Signed)
HIGH-RISK PREGNANCY VISIT Patient name: Claire Harrison MRN [id number]  Date of birth: 09/07/1996 Chief Complaint:   Routine Prenatal Visit and High Risk Gestation  History of Present Illness:   Claire Harrison is a 25 y.o. [id number] female at [redacted]w[redacted]d with an Estimated Date of Delivery: 12/31/21 being seen today for ongoing management of a high-risk pregnancy complicated by diabetes mellitus T2 IDDM currently on Lantus 60u qhs, Humalog 8u TID, h/o pre-e x2, prev c/s x 2  Today she reports  fbs 67-96 (only 2 were 96),  2hr pp 71-200 (13 >120, some lunch, mostly supper) . Wakes up frequently around 0200 feeling sugar is low, has to eat snack. Contractions: Not present. Vag. Bleeding: None.  Movement: Present. denies leaking of fluid.   Depression screen [facility] 2/9 07/01/2021 05/17/2021 10/06/2018 10/13/2016 09/15/2016  Decreased Interest 1 1 3  0 0  Down, Depressed, Hopeless 1 0 3 1 2   PHQ - 2 Score 2 1 6 1 2   Altered sleeping 2 1 - 0 0  Tired, decreased energy 2 2 3  0 0  Change in appetite 1 0 0 0 0  Feeling bad or failure about yourself  0 0 2 0 0  Trouble concentrating 0 0 0 0 0  Moving slowly or fidgety/restless 0 0 0 0 0  Suicidal thoughts 0 0 0 0 0  PHQ-9 Score 7 4 - 1 2  Difficult doing work/chores - - Very difficult - -     GAD 7 : Generalized Anxiety Score 07/01/2021 05/17/2021  Nervous, Anxious, on Edge 2 1  Control/stop worrying 1 1  Worry too much - different things 1 0  Trouble relaxing 1 0  Restless 0 0  Easily annoyed or irritable 1 1  Afraid - awful might happen 1 0  Total GAD 7 Score 7 3     Review of Systems:   Pertinent items are noted in HPI Denies abnormal vaginal discharge w/ itching/odor/irritation, headaches, visual changes, shortness of breath, chest pain, abdominal pain, severe nausea/vomiting, or problems with urination or bowel movements unless otherwise stated above. Pertinent History Reviewed:  Reviewed past medical,surgical, social, obstetrical and family  history.  Reviewed problem list, medications and allergies. Physical Assessment:   Vitals:   07/16/21 1204  BP: 116/66  Pulse: 84  Weight: 232 lb (105.2 kg)  Body mass index is 42.43 kg/m.           Physical Examination:   General appearance: alert, well appearing, and in no distress  Mental status: alert, oriented to person, place, and time  Skin: warm & dry   Extremities: Edema: None    Cardiovascular: normal heart rate noted  Respiratory: normal respiratory effort, no distress  Abdomen: gravid, soft, non-tender  Pelvic: Cervical exam deferred         Fetal Status: Fetal Heart Rate (bpm): 154   Movement: Present    Fetal Surveillance Testing today: doppler   Chaperone: N/A    Results for orders placed or performed in visit on 07/16/21 (from the past 24 hour(s))  POC Urinalysis Dipstick OB   Collection Time: 07/16/21 12:07 PM  Result Value Ref Range   Color, UA     Clarity, UA     Glucose, UA Negative Negative   Bilirubin, UA     Ketones, UA neg    Spec Grav, UA     Blood, UA neg    pH, UA     POC,PROTEIN,UA Negative Negative, Trace, Small (1+), Moderate (2+), Large (  3+), 4+   Urobilinogen, UA     Nitrite, UA neg    Leukocytes, UA Negative Negative   Appearance     Odor      Assessment & Plan:  High-risk pregnancy: [id number] at [redacted]w[redacted]d with an Estimated Date of Delivery: 12/31/21   1) T2 IDDM, unstable, discussed w/ Dr. Charlotta Newton, increase supper Humalog to 10u (12u if needed), decrease Lantus to 58u qhs. F/U 2wks w/ MD only  2) Prev c/s x 2  3) H/O pre-e x 2> ASA  4) H/O PPH  Meds: No orders of the defined types were placed in this encounter.   Labs/procedures today: none (lab closed, can't do AFP today-will do next time)  Treatment Plan:  per DM tx plan  Reviewed: Preterm labor symptoms and general obstetric precautions including but not limited to vaginal bleeding, contractions, leaking of fluid and fetal movement were reviewed in detail with the patient.   All questions were answered. Does have home bp cuff. Office bp cuff given: not applicable. Check bp weekly, let us know if consistently >140 and/or >90.  Follow-up: Return in about 2 weeks (around 07/30/2021) for HROB, AFP, MD only (for remainder of pregnancy).   Future Appointments  Date Time Provider Department Center  07/30/2021  9:10 AM Despina Hidden, Amaryllis Dyke, MD [facility]    Orders Placed This Encounter  Procedures   AFP, Serum, Open Spina Bifida   POC Urinalysis Dipstick OB   Cheral Marker CNM, [facility] 07/16/2021 12:43 PM

## 2021-07-16 NOTE — Patient Instructions (Signed)
Claire Harrison, thank you for choosing our office today! We appreciate the opportunity to meet your healthcare needs. You may receive a short survey by mail, e-mail, or through [employer]. If you are happy with your care we would appreciate if you could take just a few minutes to complete the survey questions. We read all of your comments and take your feedback very seriously. Thank you again for choosing our office.  Center for [employer] Team at [facility] at [facility] ([address] [place], [place] [address]) Entrance C, located off of [address] Free 24/7 valet parking  Go to [email or web address] to register for FREE online childbirth classes  Call the office [phone]) or go to [facility] if: You begin to severe cramping Your water breaks.  Sometimes it is a big gush of fluid, sometimes it is just a trickle that keeps getting your panties wet or running down your legs You have vaginal bleeding.  It is normal to have a small amount of spotting if your cervix was checked.   [facility] Pediatricians/Family Doctors [facility]): [address]. Colette Ribas, [phone]           [facility]: [address], [phone]                [facility] Medicine [facility]): [address], [phone] (call to ask if accepting patients) [facility] Department: [address], [place], [phone]    [facility] Pediatricians/Family Doctors [facility]): [phone] [address], [phone] [facility]: [address] [place], [phone] [facility]: [address], [phone]  [facility] Doctors  [facility]): [phone] [facility]: [address], [phone]   [facility] Doctors [facility]: [address], [phone]  [facility] Doctors  [facility]: [phone], [phone]  Home Blood Pressure Monitoring for Patients   Your provider has recommended that you check your blood pressure (BP) at least once a week at home. If you do not have a blood pressure cuff at home, one will be provided for you. Contact your provider if you have not received your monitor within 1 week.   Helpful Tips for Accurate Home Blood Pressure Checks  Don't smoke, exercise, or drink caffeine 30 minutes before checking your BP Use the restroom before checking your BP (a full bladder can raise your pressure) Relax in a comfortable upright chair Feet on the ground Left arm resting comfortably on a flat surface at the level of your heart Legs uncrossed Back supported Sit quietly and don't talk Place the cuff on your bare arm Adjust snuggly, so that only two fingertips can fit between your skin and the top of the cuff Check 2 readings separated by at least one minute Keep a log of your BP readings For a visual, please reference this diagram: http://ccnc.care/bpdiagram  Provider Name: [facility]     Phone: [phone]  Zone 1: ALL CLEAR  Continue to monitor your symptoms:  BP reading is less than 140 (top number) or less than 90 (bottom number)  No right upper stomach pain No headaches or seeing spots No feeling nauseated or throwing up No swelling in face and hands  Zone 2: CAUTION Call your doctor's office for any of the following:  BP reading is greater than 140 (top number) or greater than  90 (bottom number)  Stomach pain under your ribs in the middle or right side Headaches or seeing spots Feeling nauseated or throwing up Swelling in face and hands  Zone 3: EMERGENCY  Seek immediate medical care if you have any of the following:  BP reading is greater than160 (top number) or greater than 110 (bottom number) Severe headaches not improving with Tylenol Serious difficulty catching your breath Any worsening symptoms from  Zone 2     Second Trimester of Pregnancy The second trimester is from week 14 through week 27 (months 4 through 6). The second trimester is often a time when you feel your best. Your body has adjusted to being pregnant, and you begin to feel better physically. Usually, morning sickness has lessened or quit completely, you may have more energy, and you may have an increase in appetite. The second trimester is also a time when the fetus is growing rapidly. At the end of the sixth month, the fetus is about 9 inches long and weighs about 1 pounds. You will likely begin to feel the baby move (quickening) between 16 and 20 weeks of pregnancy. Body changes during your second trimester Your body continues to go through many changes during your second trimester. The changes vary from woman to woman. Your weight will continue to increase. You will notice your lower abdomen bulging out. You may begin to get stretch marks on your hips, abdomen, and breasts. You may develop headaches that can be relieved by medicines. The medicines should be approved by your health care provider. You may urinate more often because the fetus is pressing on your bladder. You may develop or continue to have heartburn as a result of your pregnancy. You may develop constipation because certain hormones are causing the muscles that push waste through your intestines to slow down. You may develop hemorrhoids or swollen, bulging veins (varicose veins). You may have back pain. This is caused by: Weight gain. Pregnancy hormones that are relaxing the joints in your pelvis. A shift in weight and the muscles that support your balance. Your breasts will continue to grow and they will continue to become tender. Your gums may bleed and may be sensitive to brushing and flossing. Dark spots or blotches (chloasma, mask of pregnancy) may develop on your face. This will likely fade after the baby is born. A dark line from your belly button to  the pubic area (linea nigra) may appear. This will likely fade after the baby is born. You may have changes in your hair. These can include thickening of your hair, rapid growth, and changes in texture. Some women also have hair loss during or after pregnancy, or hair that feels dry or thin. Your hair will most likely return to normal after your baby is born.  What to expect at prenatal visits During a routine prenatal visit: You will be weighed to make sure you and the fetus are growing normally. Your blood pressure will be taken. Your abdomen will be measured to track your baby's growth. The fetal heartbeat will be listened to. Any test results from the previous visit will be discussed.  Your health care provider may ask you: How you are feeling. If you are feeling the baby move. If you have had any abnormal symptoms, such as leaking fluid, bleeding, severe headaches, or abdominal cramping. If you are using any tobacco products, including cigarettes, chewing tobacco, and electronic cigarettes. If you have any questions.  Other tests that may be performed during   your second trimester include: Blood tests that check for: Low iron levels (anemia). High blood sugar that affects pregnant women (gestational diabetes) between 24 and 28 weeks. Rh antibodies. This is to check for a protein on red blood cells (Rh factor). Urine tests to check for infections, diabetes, or protein in the urine. An ultrasound to confirm the proper growth and development of the baby. An amniocentesis to check for possible genetic problems. Fetal screens for spina bifida and Down syndrome. HIV (human immunodeficiency virus) testing. Routine prenatal testing includes screening for HIV, unless you choose not to have this test.  Follow these instructions at home: Medicines Follow your health care provider's instructions regarding medicine use. Specific medicines may be either safe or unsafe to take during  pregnancy. Take a prenatal vitamin that contains at least 600 micrograms (mcg) of folic acid. If you develop constipation, try taking a stool softener if your health care provider approves. Eating and drinking Eat a balanced diet that includes fresh fruits and vegetables, whole grains, good sources of protein such as meat, eggs, or tofu, and low-fat dairy. Your health care provider will help you determine the amount of weight gain that is right for you. Avoid raw meat and uncooked cheese. These carry germs that can cause birth defects in the baby. If you have low calcium intake from food, talk to your health care provider about whether you should take a daily calcium supplement. Limit foods that are high in fat and processed sugars, such as fried and sweet foods. To prevent constipation: Drink enough fluid to keep your urine clear or pale yellow. Eat foods that are high in fiber, such as fresh fruits and vegetables, whole grains, and beans. Activity Exercise only as directed by your health care provider. Most women can continue their usual exercise routine during pregnancy. Try to exercise for 30 minutes at least 5 days a week. Stop exercising if you experience uterine contractions. Avoid heavy lifting, wear low heel shoes, and practice good posture. A sexual relationship may be continued unless your health care provider directs you otherwise. Relieving pain and discomfort Wear a good support bra to prevent discomfort from breast tenderness. Take warm sitz baths to soothe any pain or discomfort caused by hemorrhoids. Use hemorrhoid cream if your health care provider approves. Rest with your legs elevated if you have leg cramps or low back pain. If you develop varicose veins, wear support hose. Elevate your feet for 15 minutes, 3-4 times a day. Limit salt in your diet. Prenatal Care Write down your questions. Take them to your prenatal visits. Keep all your prenatal visits as told by your health  care provider. This is important. Safety Wear your seat belt at all times when driving. Make a list of emergency phone numbers, including numbers for family, friends, the hospital, and police and fire departments. General instructions Ask your health care provider for a referral to a local prenatal education class. Begin classes no later than the beginning of month 6 of your pregnancy. Ask for help if you have counseling or nutritional needs during pregnancy. Your health care provider can offer advice or refer you to specialists for help with various needs. Do not use hot tubs, steam rooms, or saunas. Do not douche or use tampons or scented sanitary pads. Do not cross your legs for long periods of time. Avoid cat litter boxes and soil used by cats. These carry germs that can cause birth defects in the baby and possibly loss of the   fetus by miscarriage or stillbirth. Avoid all smoking, herbs, alcohol, and unprescribed drugs. Chemicals in these products can affect the formation and growth of the baby. Do not use any products that contain nicotine or tobacco, such as cigarettes and e-cigarettes. If you need help quitting, ask your health care provider. Visit your dentist if you have not gone yet during your pregnancy. Use a soft toothbrush to brush your teeth and be gentle when you floss. Contact a health care provider if: You have dizziness. You have mild pelvic cramps, pelvic pressure, or nagging pain in the abdominal area. You have persistent nausea, vomiting, or diarrhea. You have a bad smelling vaginal discharge. You have pain when you urinate. Get help right away if: You have a fever. You are leaking fluid from your vagina. You have spotting or bleeding from your vagina. You have severe abdominal cramping or pain. You have rapid weight gain or weight loss. You have shortness of breath with chest pain. You notice sudden or extreme swelling of your face, hands, ankles, feet, or legs. You  have not felt your baby move in over an hour. You have severe headaches that do not go away when you take medicine. You have vision changes. Summary The second trimester is from week 14 through week 27 (months 4 through 6). It is also a time when the fetus is growing rapidly. Your body goes through many changes during pregnancy. The changes vary from woman to woman. Avoid all smoking, herbs, alcohol, and unprescribed drugs. These chemicals affect the formation and growth your baby. Do not use any tobacco products, such as cigarettes, chewing tobacco, and e-cigarettes. If you need help quitting, ask your health care provider. Contact your health care provider if you have any questions. Keep all prenatal visits as told by your health care provider. This is important. This information is not intended to replace advice given to you by your health care provider. Make sure you discuss any questions you have with your health care provider. Document Released: 09/09/2001 Document Revised: 02/21/2016 Document Reviewed: 11/16/2012 Elsevier Interactive Patient Education  2017 Elsevier Inc.  

## 2021-07-30 ENCOUNTER — Other Ambulatory Visit: Payer: Self-pay

## 2021-07-30 ENCOUNTER — Ambulatory Visit (INDEPENDENT_AMBULATORY_CARE_PROVIDER_SITE_OTHER): Payer: Medicaid Other | Admitting: Obstetrics & Gynecology

## 2021-07-30 ENCOUNTER — Encounter: Payer: Self-pay | Admitting: Obstetrics & Gynecology

## 2021-07-30 VITALS — Wt 233.5 lb

## 2021-07-30 DIAGNOSIS — O24319 Unspecified pre-existing diabetes mellitus in pregnancy, unspecified trimester: Secondary | ICD-10-CM

## 2021-07-30 DIAGNOSIS — O0992 Supervision of high risk pregnancy, unspecified, second trimester: Secondary | ICD-10-CM

## 2021-07-30 DIAGNOSIS — Z1379 Encounter for other screening for genetic and chromosomal anomalies: Secondary | ICD-10-CM

## 2021-07-30 DIAGNOSIS — Z23 Encounter for immunization: Secondary | ICD-10-CM

## 2021-07-30 DIAGNOSIS — Z3A18 18 weeks gestation of pregnancy: Secondary | ICD-10-CM

## 2021-07-30 LAB — POCT URINALYSIS DIPSTICK OB
Glucose, UA: NEGATIVE
Ketones, UA: NEGATIVE
Nitrite, UA: NEGATIVE
POC,PROTEIN,UA: NEGATIVE

## 2021-07-30 MED ORDER — ONDANSETRON 8 MG PO TBDP
8.0000 mg | ORAL_TABLET | Freq: Three times a day (TID) | ORAL | 0 refills | Status: DC | PRN
Start: 2021-07-30 — End: 2021-12-12

## 2021-07-30 NOTE — Progress Notes (Signed)
HIGH-RISK PREGNANCY VISIT Patient name: Claire Harrison MRN [id number]  Date of birth: [birth year] Chief Complaint:   High Risk Gestation (AFP today; hives on face; Diclegis not helping nausea)  History of Present Illness:   Claire Harrison is a 25 y.o. [id number] female at [redacted]w[redacted]d with an Estimated Date of Delivery: 12/31/21 being seen today for ongoing management of a high-risk pregnancy complicated by Class B DM on insulin, Hx of pre eclampsia x 2, Previous C section x 2.    Today she reports no complaints. Contractions: Not present. Vag. Bleeding: None.  Movement: Present. denies leaking of fluid.   Depression screen [facility] 2/9 07/01/2021 05/17/2021 10/06/2018 10/13/2016 09/15/2016  Decreased Interest 1 1 3  0 0  Down, Depressed, Hopeless 1 0 3 1 2   PHQ - 2 Score 2 1 6 1 2   Altered sleeping 2 1 - 0 0  Tired, decreased energy 2 2 3  0 0  Change in appetite 1 0 0 0 0  Feeling bad or failure about yourself  0 0 2 0 0  Trouble concentrating 0 0 0 0 0  Moving slowly or fidgety/restless 0 0 0 0 0  Suicidal thoughts 0 0 0 0 0  PHQ-9 Score 7 4 - 1 2  Difficult doing work/chores - - Very difficult - -     GAD 7 : Generalized Anxiety Score 07/01/2021 05/17/2021  Nervous, Anxious, on Edge 2 1  Control/stop worrying 1 1  Worry too much - different things 1 0  Trouble relaxing 1 0  Restless 0 0  Easily annoyed or irritable 1 1  Afraid - awful might happen 1 0  Total GAD 7 Score 7 3     Review of Systems:   Pertinent items are noted in HPI Denies abnormal vaginal discharge w/ itching/odor/irritation, headaches, visual changes, shortness of breath, chest pain, abdominal pain, severe nausea/vomiting, or problems with urination or bowel movements unless otherwise stated above. Pertinent History Reviewed:  Reviewed past medical,surgical, social, obstetrical and family history.  Reviewed problem list, medications and allergies. Physical Assessment:   Vitals:   07/30/21 0906  Weight: 233 lb 8 oz (105.9  kg)  Body mass index is 42.71 kg/m.           Physical Examination:   General appearance: alert, well appearing, and in no distress  Mental status: alert, oriented to person, place, and time  Skin: warm & dry   Extremities: Edema: Trace    Cardiovascular: normal heart rate noted  Respiratory: normal respiratory effort, no distress  Abdomen: gravid, soft, non-tender  Pelvic: Cervical exam deferred         Fetal Status: Fetal Heart Rate (bpm): 155 Fundal Height: 20 cm Movement: Present    Fetal Surveillance Testing today:    Chaperone: N/A    Results for orders placed or performed in visit on 07/30/21 (from the past 24 hour(s))  POC Urinalysis Dipstick OB   Collection Time: 07/30/21  9:11 AM  Result Value Ref Range   Color, UA     Clarity, UA     Glucose, UA Negative Negative   Bilirubin, UA     Ketones, UA neg    Spec Grav, UA     Blood, UA trace    pH, UA     POC,PROTEIN,UA Negative Negative, Trace, Small (1+), Moderate (2+), Large (3+), 4+   Urobilinogen, UA     Nitrite, UA neg    Leukocytes, UA Trace (A) Negative   Appearance  Odor      Assessment & Plan:  High-risk pregnancy: [id number] at [redacted]w[redacted]d with an Estimated Date of Delivery: 12/31/21   1) Class B DM, stable  2) Hx of pre eclmapsia x 2,   3) Hx of C section x 2  Meds:  Meds ordered this encounter  Medications   ondansetron (ZOFRAN ODT) 8 MG disintegrating tablet    Sig: Take 1 tablet (8 mg total) by mouth every 8 (eight) hours as needed for nausea or vomiting.    Dispense:  20 tablet    Refill:  0    Labs/procedures today: AFP  Treatment Plan:  anatomy scan 3 weeks, fetal ECHO around 24 weeks  Reviewed: Preterm labor symptoms and general obstetric precautions including but not limited to vaginal bleeding, contractions, leaking of fluid and fetal movement were reviewed in detail with the patient.  All questions were answered. Does have home bp cuff. Office bp cuff given: not applicable. Check bp  weekly, let us know if consistently >140 and/or >90.  Follow-up: Return in about 3 weeks (around 08/20/2021) for 20 week sono, HROB.   No future appointments.  Orders Placed This Encounter  Procedures   AFP, Serum, Open Spina Bifida   POC Urinalysis Dipstick OB   Lazaro Arms  07/30/2021 9:32 AM

## 2021-07-30 NOTE — Addendum Note (Signed)
Addended by: Lazaro Arms on: 07/30/2021 09:34 AM   Modules accepted: Orders

## 2021-08-01 LAB — AFP, SERUM, OPEN SPINA BIFIDA
AFP MoM: 0.69
AFP Value: 19.3 ng/mL
Gest. Age on Collection Date: 18 weeks
Maternal Age At EDD: 27.9 yr
OSBR Risk 1 IN: 10000
Test Results:: NEGATIVE
Weight: 234 [lb_av]

## 2021-08-20 ENCOUNTER — Telehealth: Payer: Self-pay | Admitting: *Deleted

## 2021-08-20 NOTE — Telephone Encounter (Signed)
Patient states she was seen at [facility] on 11/20 with complaints of lower back pain.  She was admitted to L&D for contraction monitoring and was discharged home as no contractions were noted.  Pt states she is still experiencing the pain mostly on her left lower side.  She did have some body aches and chills over the weekend but not currently.  Denies fever, bleeding or abnormal discharge.  She is concerned she may have an UTI but urine dip at [facility] was negative.  She is having some burning with urination.  Offered for patient to come in this afternoon but patient declined stating she couldn't but would come tomorrow.  Placed on the nurse schedule but advised to seek care if symptoms worsened.  Pt verbalized understanding and agreeable to plan.

## 2021-08-21 ENCOUNTER — Other Ambulatory Visit (INDEPENDENT_AMBULATORY_CARE_PROVIDER_SITE_OTHER): Payer: Medicaid Other | Admitting: *Deleted

## 2021-08-21 ENCOUNTER — Other Ambulatory Visit (HOSPITAL_COMMUNITY)
Admission: RE | Admit: 2021-08-21 | Discharge: 2021-08-21 | Disposition: A | Discharge status: Home / Self Care | Payer: Medicaid Other | Source: Ambulatory Visit | Attending: Obstetrics & Gynecology | Admitting: Obstetrics & Gynecology

## 2021-08-21 ENCOUNTER — Other Ambulatory Visit: Payer: Self-pay

## 2021-08-21 DIAGNOSIS — O26892 Other specified pregnancy related conditions, second trimester: Secondary | ICD-10-CM | POA: Insufficient documentation

## 2021-08-21 DIAGNOSIS — N898 Other specified noninflammatory disorders of vagina: Secondary | ICD-10-CM | POA: Insufficient documentation

## 2021-08-21 DIAGNOSIS — M549 Dorsalgia, unspecified: Secondary | ICD-10-CM

## 2021-08-21 DIAGNOSIS — Z3A21 21 weeks gestation of pregnancy: Secondary | ICD-10-CM

## 2021-08-21 DIAGNOSIS — O0992 Supervision of high risk pregnancy, unspecified, second trimester: Secondary | ICD-10-CM

## 2021-08-21 DIAGNOSIS — O99891 Other specified diseases and conditions complicating pregnancy: Secondary | ICD-10-CM | POA: Diagnosis not present

## 2021-08-21 DIAGNOSIS — R350 Frequency of micturition: Secondary | ICD-10-CM

## 2021-08-21 LAB — POCT URINALYSIS DIPSTICK OB
Blood, UA: NEGATIVE
Glucose, UA: NEGATIVE
Ketones, UA: NEGATIVE
Leukocytes, UA: NEGATIVE
Nitrite, UA: NEGATIVE

## 2021-08-21 MED ORDER — NITROFURANTOIN MONOHYD MACRO 100 MG PO CAPS: 100.0000 mg | ORAL_CAPSULE | Freq: Two times a day (BID) | ORAL | 0 refills | Status: DC

## 2021-08-21 NOTE — Addendum Note (Signed)
Addended by: Shawna Clamp R on: 08/21/2021 02:10 PM   Modules accepted: Orders

## 2021-08-21 NOTE — Progress Notes (Addendum)
   NURSE VISIT- UTI SYMPTOMS   SUBJECTIVE:  Claire Harrison is a 25 y.o. [phone] female here for UTI symptoms. She is [redacted]w[redacted]d pregnant. She reports  + back pain, urinary frequency . Burning after sex, some vaginal discharge. CV swab sent for GC/CHL, trich, BV and yeast to lab. Temp 98.9.  OBJECTIVE:  LMP 03/26/2021   Appears well, in no apparent distress  No results found for this or any previous visit (from the past 24 hour(s)).  ASSESSMENT: Pregnancy [redacted]w[redacted]d with UTI symptoms and negative nitrites  PLAN: Discussed with Claire Harrison, CNM, [facility]   Rx sent by provider today: Yes Urine culture sent Call or return to clinic prn if these symptoms worsen or fail to improve as anticipated. Follow-up: as scheduled   Malachy Mood  08/21/2021 1:55 PM  Chart reviewed for nurse visit. Agree with plan of care. Rx macrobid Cheral Marker, [place] 08/21/2021 2:10 PM

## 2021-08-25 LAB — URINE CULTURE

## 2021-08-27 LAB — CERVICOVAGINAL ANCILLARY ONLY
Bacterial Vaginitis (gardnerella): NEGATIVE
Candida Glabrata: NEGATIVE
Candida Vaginitis: POSITIVE — AB
Chlamydia: NEGATIVE
Comment: NEGATIVE
Comment: NEGATIVE
Comment: NEGATIVE
Comment: NEGATIVE
Comment: NEGATIVE
Comment: NORMAL
Neisseria Gonorrhea: NEGATIVE
Trichomonas: NEGATIVE

## 2021-08-28 ENCOUNTER — Encounter: Payer: Self-pay | Admitting: Women's Health

## 2021-08-28 ENCOUNTER — Telehealth: Payer: Self-pay

## 2021-08-28 NOTE — Telephone Encounter (Signed)
-----   Message from Cheral Marker, [place] sent at 08/28/2021  8:40 AM EST ----- Hasn't read [employer]. Please call and read it to her/have her read it. Thanks

## 2021-08-28 NOTE — Telephone Encounter (Signed)
Called pt per Joellyn Haff to relay test results, no answer, left vm

## 2021-08-29 ENCOUNTER — Other Ambulatory Visit: Payer: Self-pay

## 2021-08-29 ENCOUNTER — Ambulatory Visit (INDEPENDENT_AMBULATORY_CARE_PROVIDER_SITE_OTHER): Payer: Medicaid Other | Admitting: Obstetrics & Gynecology

## 2021-08-29 ENCOUNTER — Encounter: Payer: Self-pay | Admitting: Obstetrics & Gynecology

## 2021-08-29 ENCOUNTER — Ambulatory Visit (INDEPENDENT_AMBULATORY_CARE_PROVIDER_SITE_OTHER): Payer: Medicaid Other

## 2021-08-29 VITALS — BP 109/68 | HR 81 | Wt 240.8 lb

## 2021-08-29 DIAGNOSIS — Z3A22 22 weeks gestation of pregnancy: Secondary | ICD-10-CM | POA: Diagnosis not present

## 2021-08-29 DIAGNOSIS — O0992 Supervision of high risk pregnancy, unspecified, second trimester: Secondary | ICD-10-CM | POA: Diagnosis not present

## 2021-08-29 DIAGNOSIS — O0990 Supervision of high risk pregnancy, unspecified, unspecified trimester: Secondary | ICD-10-CM

## 2021-08-29 LAB — POCT URINALYSIS DIPSTICK OB
Blood, UA: NEGATIVE
Glucose, UA: NEGATIVE
Leukocytes, UA: NEGATIVE
Nitrite, UA: NEGATIVE

## 2021-08-29 NOTE — Progress Notes (Signed)
[place] 22+2 wks,breech,cx 3.8 cm,anterior placenta gr 0,normal ovaries,fhr 159 bpm,svp of fluid 6.8 cm,EFW 473 g 32%,anatomy complete,no obvious abnormalities

## 2021-08-29 NOTE — Progress Notes (Signed)
HIGH-RISK PREGNANCY VISIT Patient name: Claire Harrison MRN [id number]  Date of birth: 09/08/1996 Chief Complaint:   Routine Prenatal Visit, High Risk Gestation, and Pregnancy Ultrasound  History of Present Illness:   Claire Harrison is a 25 y.o. [id number] female at 46w2dwith an Estimated Date of Delivery: 12/31/21 being seen today for ongoing management of a high-risk pregnancy complicated by   -Class B DM- sugar log reviewed, mostly controlled with current medication -Prior C-section x 2 with h/o PPH -h/o preeclampsia- on ASA 1648mdaily  Today she reports no complaints.   Contractions: Not present. Vag. Bleeding: None.  Movement: Present. denies leaking of fluid.   [facility] 07/01/2021 05/17/2021 10/06/2018 10/13/2016 09/15/2016  Decreased Interest 1 1 3  0 0  Down, Depressed, Hopeless 1 0 3 1 2   PHQ - 2 Score 2 1 6 1 2   Altered sleeping 2 1 - 0 0  Tired, decreased energy 2 2 3  0 0  Change in appetite 1 0 0 0 0  Feeling bad or failure about yourself  0 0 2 0 0  Trouble concentrating 0 0 0 0 0  Moving slowly or fidgety/restless 0 0 0 0 0  Suicidal thoughts 0 0 0 0 0  PHQ-9 Score 7 4 - 1 2  Difficult doing work/chores - - Very difficult - -     Current Outpatient Medications  Medication Instructions   ACCU-CHEK GUIDE test strip USE 1 STRIP TO CHECK GLUCOSE 4 TIMES DAILY AS  INSTRUCTED   acetaminophen (TYLENOL) 650 mg, Oral, Every 6 hours PRN   aspirin 162 mg, Oral, Daily, Swallow whole.   Blood Glucose Monitoring Suppl (ACCU-CHEK GUIDE) w/Device KIT USE AS DIRECTED   Blood Pressure Monitor MISC For regular home bp monitoring during pregnancy   Doxylamine-Pyridoxine (DICLEGIS) 10-10 MG TBEC 2 tabs q hs, if sx persist add 1 tab q am on day 3, if sx persist add 1 tab q afternoon on day 4   HumaLOG KwikPen 8 Units, Subcutaneous, 3 times daily with meals   insulin glargine (LANTUS) 55 Units, Subcutaneous, Daily at bedtime   nitrofurantoin (macrocrystal-monohydrate)  (MACROBID) 100 mg, Oral, 2 times daily, X 7 days   ondansetron (ZOFRAN ODT) 8 mg, Oral, Every 8 hours PRN   Prenatal Vit-Fe Fumarate-FA (PRENATAL VITAMIN PO) Oral, Daily     Review of Systems:   Pertinent items are noted in HPI Denies abnormal vaginal discharge w/ itching/odor/irritation, headaches, visual changes, shortness of breath, chest pain, abdominal pain, severe nausea/vomiting, or problems with urination or bowel movements unless otherwise stated above. Pertinent History Reviewed:  Reviewed past medical,surgical, social, obstetrical and family history.  Reviewed problem list, medications and allergies. Physical Assessment:   Vitals:   08/29/21 1444  BP: 109/68  Pulse: 81  Weight: 240 lb 12.8 oz (109.2 kg)  Body mass index is 44.04 kg/m.           Physical Examination:   General appearance: alert, well appearing, and in no distress  Mental status: normal mood, behavior, speech, dress, motor activity, and thought processes  Skin: warm & dry   Extremities: Edema: Trace    Cardiovascular: normal heart rate noted  Respiratory: normal respiratory effort, no distress  Abdomen: gravid, soft, non-tender  Pelvic: Cervical exam deferred         Fetal Status:     Movement: Present    Fetal Surveillance Testing today: growth- breech,cx 3.8 cm,anterior placenta gr 0,normal ovaries,fhr 159 bpm,svp of fluid 6.8 cm,EFW  473 g 32%,anatomy complete,no obvious abnormalities    Chaperone: N/A    Results for orders placed or performed in visit on 08/29/21 (from the past 24 hour(s))  POC Urinalysis Dipstick OB   Collection Time: 08/29/21  2:48 PM  Result Value Ref Range   Color, UA     Clarity, UA     Glucose, UA Negative Negative   Bilirubin, UA     Ketones, UA small    Spec Grav, UA     Blood, UA neg    pH, UA     POC,PROTEIN,UA Trace Negative, Trace, Small (1+), Moderate (2+), Large (3+), 4+   Urobilinogen, UA     Nitrite, UA neg    Leukocytes, UA Negative Negative    Appearance     Odor       Assessment & Plan:  High-risk pregnancy: [id number] at 65w2dwith an Estimated Date of Delivery: 12/31/21   1) Class B DM -ontinue with current regimen Lantus 52 qhs, Novolog 10-12 with meals -normal anatomy today -continue q 4wk growth scans -32wks- antepartum testing []  message sent for ECHO  2) prior C-section x 2 -discussed risk/benefit of repeat C-section vs TOLAC -pt wishes to consider TOLAC, discussed concern regarding uterine rupture -personally advised repeat C-section due to increased risk of rupture though I discussed that other providers may allow her to labor AND it would likely depend on this situation at the time of delivery.  May reassess closer to delivery  -Discussed contraception- desires Nexplanon   Meds: No orders of the defined types were placed in this encounter.   Labs/procedures today: UKoreagrowth  Treatment Plan:  continue plan as outlined above  Reviewed: Preterm labor symptoms and general obstetric precautions including but not limited to vaginal bleeding, contractions, leaking of fluid and fetal movement were reviewed in detail with the patient.  All questions were answered.   Follow-up: Return in about 4 weeks (around 09/26/2021) for HROB visit and growth .   Future Appointments  Date Time Provider DRosamond 10/01/2021 10:30 AM [facility] None  10/01/2021 11:50 AM Eure, LMertie Clause MD [facility]    Orders Placed This Encounter  Procedures   POC Urinalysis Dipstick OB    JJanyth Pupa DO Attending [place] [facility] [place]

## 2021-09-04 ENCOUNTER — Telehealth: Payer: Self-pay | Admitting: Obstetrics & Gynecology

## 2021-09-04 NOTE — Telephone Encounter (Signed)
Returned pt's call for clarification. Two identifiers used. Pt instructed to take Tylenol for fever and body aches, take nausea meds in order to eat something and keep well hydrated, and rest. Pt's questions answered. Pt felt reassured and confirmed understanding.

## 2021-09-04 NOTE — Telephone Encounter (Signed)
Patient's children has had the flu. She's been feeling light headed, running fever, body aching, not eating, trying to drink liquids but gets nauseous. Patient states baby is not moving as much. She has felt some flutter.

## 2021-09-25 ENCOUNTER — Telehealth: Payer: Self-pay | Admitting: *Deleted

## 2021-09-25 NOTE — Telephone Encounter (Signed)
Patient called requesting inhaler for asthma and she feels like she is wheezing.   Does not currently have an inhaler. Please advise.

## 2021-09-27 ENCOUNTER — Other Ambulatory Visit: Payer: Self-pay | Admitting: Obstetrics & Gynecology

## 2021-09-27 DIAGNOSIS — O24419 Gestational diabetes mellitus in pregnancy, unspecified control: Secondary | ICD-10-CM

## 2021-09-27 NOTE — Telephone Encounter (Signed)
LMOVM per Dr Charlotta Newton,  further evaluation/work up needed if possible by PCP.

## 2021-10-01 ENCOUNTER — Ambulatory Visit (INDEPENDENT_AMBULATORY_CARE_PROVIDER_SITE_OTHER): Payer: Medicaid Other | Admitting: Obstetrics & Gynecology

## 2021-10-01 ENCOUNTER — Encounter: Payer: Self-pay | Admitting: Obstetrics & Gynecology

## 2021-10-01 ENCOUNTER — Ambulatory Visit (INDEPENDENT_AMBULATORY_CARE_PROVIDER_SITE_OTHER): Payer: Medicaid Other

## 2021-10-01 ENCOUNTER — Other Ambulatory Visit: Payer: Self-pay

## 2021-10-01 VITALS — BP 117/77 | HR 88 | Wt 245.5 lb

## 2021-10-01 DIAGNOSIS — Z23 Encounter for immunization: Secondary | ICD-10-CM

## 2021-10-01 DIAGNOSIS — O24419 Gestational diabetes mellitus in pregnancy, unspecified control: Secondary | ICD-10-CM

## 2021-10-01 DIAGNOSIS — O0992 Supervision of high risk pregnancy, unspecified, second trimester: Secondary | ICD-10-CM

## 2021-10-01 DIAGNOSIS — Z3A27 27 weeks gestation of pregnancy: Secondary | ICD-10-CM

## 2021-10-01 DIAGNOSIS — O24319 Unspecified pre-existing diabetes mellitus in pregnancy, unspecified trimester: Secondary | ICD-10-CM

## 2021-10-01 LAB — POCT URINALYSIS DIPSTICK OB
Blood, UA: NEGATIVE
Glucose, UA: NEGATIVE
Ketones, UA: NEGATIVE
Leukocytes, UA: NEGATIVE
Nitrite, UA: NEGATIVE
POC,PROTEIN,UA: NEGATIVE

## 2021-10-01 NOTE — Progress Notes (Signed)
HIGH-RISK PREGNANCY VISIT Patient name: Claire Harrison MRN [id number]  Date of birth: 11-22-1995 Chief Complaint:   High Risk Gestation ([place] today; + headaches; seeing spots)  History of Present Illness:   Claire Harrison is a 26 y.o. [id number] female at [redacted]w[redacted]d with an Estimated Date of Delivery: 12/31/21 being seen today for ongoing management of a high-risk pregnancy complicated by Class B DM.    Today she reports no complaints. Contractions: Not present. Vag. Bleeding: None.  Movement: Present. denies leaking of fluid.   Depression screen [facility] 2/9 07/01/2021 05/17/2021 10/06/2018 10/13/2016 09/15/2016  Decreased Interest 1 1 3  0 0  Down, Depressed, Hopeless 1 0 3 1 2   PHQ - 2 Score 2 1 6 1 2   Altered sleeping 2 1 - 0 0  Tired, decreased energy 2 2 3  0 0  Change in appetite 1 0 0 0 0  Feeling bad or failure about yourself  0 0 2 0 0  Trouble concentrating 0 0 0 0 0  Moving slowly or fidgety/restless 0 0 0 0 0  Suicidal thoughts 0 0 0 0 0  PHQ-9 Score 7 4 - 1 2  Difficult doing work/chores - - Very difficult - -     GAD 7 : Generalized Anxiety Score 07/01/2021 05/17/2021  Nervous, Anxious, on Edge 2 1  Control/stop worrying 1 1  Worry too much - different things 1 0  Trouble relaxing 1 0  Restless 0 0  Easily annoyed or irritable 1 1  Afraid - awful might happen 1 0  Total GAD 7 Score 7 3     Review of Systems:   Pertinent items are noted in HPI Denies abnormal vaginal discharge w/ itching/odor/irritation, headaches, visual changes, shortness of breath, chest pain, abdominal pain, severe nausea/vomiting, or problems with urination or bowel movements unless otherwise stated above. Pertinent History Reviewed:  Reviewed past medical,surgical, social, obstetrical and family history.  Reviewed problem list, medications and allergies. Physical Assessment:   Vitals:   10/01/21 1124  BP: 117/77  Pulse: 88  Weight: 245 lb 8 oz (111.4 kg)  Body mass index is 44.9 kg/m.            Physical Examination:   General appearance: alert, well appearing, and in no distress  Mental status: alert, oriented to person, place, and time  Skin: warm & dry   Extremities: Edema: None    Cardiovascular: normal heart rate noted  Respiratory: normal respiratory effort, no distress  Abdomen: gravid, soft, non-tender  Pelvic: Cervical exam deferred         Fetal Status:     Movement: Present    Fetal Surveillance Testing today: sonogram is normal good growth percentile   Chaperone: N/A    Results for orders placed or performed in visit on 10/01/21 (from the past 24 hour(s))  POC Urinalysis Dipstick OB   Collection Time: 10/01/21 11:29 AM  Result Value Ref Range   Color, UA     Clarity, UA     Glucose, UA Negative Negative   Bilirubin, UA     Ketones, UA neg    Spec Grav, UA     Blood, UA neg    pH, UA     POC,PROTEIN,UA Negative Negative, Trace, Small (1+), Moderate (2+), Large (3+), 4+   Urobilinogen, UA     Nitrite, UA neg    Leukocytes, UA Negative Negative   Appearance     Odor      Assessment & Plan:  High-risk pregnancy: [id number] at [redacted]w[redacted]d with an Estimated Date of Delivery: 12/31/21   1) Class B DM, stable Lantus 55 units at bedtime Humalog 10-12 with meals  2) Hx of C section x 2,   On 162 mg ASA  Meds: No orders of the defined types were placed in this encounter.   Labs/procedures today: U/S  Treatment Plan:  per protocol ofr B DM  Reviewed: Preterm labor symptoms and general obstetric precautions including but not limited to vaginal bleeding, contractions, leaking of fluid and fetal movement were reviewed in detail with the patient.  All questions were answered. Does have home bp cuff. Office bp cuff given: not applicable. Check bp daily, let us know if consistently >140 and/or >90.  Follow-up: Return in about 2 weeks (around 10/15/2021) for HROB.   No future appointments.  Orders Placed This Encounter  Procedures   POC Urinalysis Dipstick OB    Amaryllis Dyke Lashara Urey  10/01/2021 12:05 PM

## 2021-10-01 NOTE — Progress Notes (Signed)
[place] 27 wks,breech,anterior placenta gr 0,cx 4 cm,AFI 15.8 cm,FHR 154 bpm,EFW 1076 g 56%

## 2021-10-01 NOTE — Addendum Note (Signed)
Addended by: Linton Rump on: 10/01/2021 12:13 PM   Modules accepted: Orders

## 2021-10-14 ENCOUNTER — Emergency Department (HOSPITAL_COMMUNITY)
Admission: EM | Admit: 2021-10-14 | Discharge: 2021-10-14 | Disposition: A | Discharge status: Home / Self Care | Payer: Medicaid Other | Attending: Emergency Medicine | Admitting: Emergency Medicine

## 2021-10-14 ENCOUNTER — Encounter (HOSPITAL_COMMUNITY): Payer: Self-pay | Admitting: *Deleted

## 2021-10-14 ENCOUNTER — Other Ambulatory Visit: Payer: Self-pay

## 2021-10-14 DIAGNOSIS — E119 Type 2 diabetes mellitus without complications: Secondary | ICD-10-CM | POA: Insufficient documentation

## 2021-10-14 DIAGNOSIS — Z91010 Allergy to peanuts: Secondary | ICD-10-CM | POA: Diagnosis not present

## 2021-10-14 DIAGNOSIS — Z7982 Long term (current) use of aspirin: Secondary | ICD-10-CM | POA: Insufficient documentation

## 2021-10-14 DIAGNOSIS — Z794 Long term (current) use of insulin: Secondary | ICD-10-CM | POA: Insufficient documentation

## 2021-10-14 DIAGNOSIS — D72829 Elevated white blood cell count, unspecified: Secondary | ICD-10-CM | POA: Insufficient documentation

## 2021-10-14 DIAGNOSIS — Z91040 Latex allergy status: Secondary | ICD-10-CM | POA: Insufficient documentation

## 2021-10-14 DIAGNOSIS — Z79899 Other long term (current) drug therapy: Secondary | ICD-10-CM | POA: Insufficient documentation

## 2021-10-14 DIAGNOSIS — R519 Headache, unspecified: Secondary | ICD-10-CM

## 2021-10-14 DIAGNOSIS — O26893 Other specified pregnancy related conditions, third trimester: Secondary | ICD-10-CM | POA: Insufficient documentation

## 2021-10-14 DIAGNOSIS — H538 Other visual disturbances: Secondary | ICD-10-CM | POA: Diagnosis not present

## 2021-10-14 DIAGNOSIS — Z3A27 27 weeks gestation of pregnancy: Secondary | ICD-10-CM | POA: Diagnosis not present

## 2021-10-14 DIAGNOSIS — O219 Vomiting of pregnancy, unspecified: Secondary | ICD-10-CM | POA: Diagnosis not present

## 2021-10-14 DIAGNOSIS — Z20822 Contact with and (suspected) exposure to covid-19: Secondary | ICD-10-CM | POA: Insufficient documentation

## 2021-10-14 LAB — CBC WITH DIFFERENTIAL/PLATELET
Abs Immature Granulocytes: 0.15 10*3/uL — ABNORMAL HIGH (ref 0.00–0.07)
Basophils Absolute: 0 10*3/uL (ref 0.0–0.1)
Basophils Relative: 0 %
Eosinophils Absolute: 0.2 10*3/uL (ref 0.0–0.5)
Eosinophils Relative: 2 %
HCT: 36.9 % (ref 36.0–46.0)
Hemoglobin: 12.2 g/dL (ref 12.0–15.0)
Immature Granulocytes: 1 %
Lymphocytes Relative: 20 %
Lymphs Abs: 2.3 10*3/uL (ref 0.7–4.0)
MCH: 26.8 pg (ref 26.0–34.0)
MCHC: 33.1 g/dL (ref 30.0–36.0)
MCV: 80.9 fL (ref 80.0–100.0)
Monocytes Absolute: 0.6 10*3/uL (ref 0.1–1.0)
Monocytes Relative: 5 %
Neutro Abs: 8.4 10*3/uL — ABNORMAL HIGH (ref 1.7–7.7)
Neutrophils Relative %: 72 %
Platelets: 346 10*3/uL (ref 150–400)
RBC: 4.56 MIL/uL (ref 3.87–5.11)
RDW: 13.6 % (ref 11.5–15.5)
WBC: 11.7 10*3/uL — ABNORMAL HIGH (ref 4.0–10.5)
nRBC: 0 % (ref 0.0–0.2)

## 2021-10-14 LAB — COMPREHENSIVE METABOLIC PANEL
ALT: 16 U/L (ref 0–44)
AST: 14 U/L — ABNORMAL LOW (ref 15–41)
Albumin: 3.1 g/dL — ABNORMAL LOW (ref 3.5–5.0)
Alkaline Phosphatase: 91 U/L (ref 38–126)
Anion gap: 8 (ref 5–15)
BUN: 8 mg/dL (ref 6–20)
CO2: 20 mmol/L — ABNORMAL LOW (ref 22–32)
Calcium: 8.7 mg/dL — ABNORMAL LOW (ref 8.9–10.3)
Chloride: 109 mmol/L (ref 98–111)
Creatinine, Ser: 0.41 mg/dL — ABNORMAL LOW (ref 0.44–1.00)
GFR, Estimated: >60 mL/min (ref >60)
Glucose, Bld: 91 mg/dL (ref 70–99)
Potassium: 3.6 mmol/L (ref 3.5–5.1)
Sodium: 137 mmol/L (ref 135–145)
Total Bilirubin: <0.1 mg/dL — ABNORMAL LOW (ref 0.3–1.2)
Total Protein: 7 g/dL (ref 6.5–8.1)

## 2021-10-14 LAB — URINALYSIS, ROUTINE W REFLEX MICROSCOPIC
Bilirubin Urine: NEGATIVE
Glucose, UA: >=500 mg/dL — AB
Hgb urine dipstick: NEGATIVE
Ketones, ur: NEGATIVE mg/dL
Leukocytes,Ua: NEGATIVE
Nitrite: NEGATIVE
Protein, ur: NEGATIVE mg/dL
Specific Gravity, Urine: 1.025 (ref 1.005–1.030)
pH: 6.5 (ref 5.0–8.0)

## 2021-10-14 LAB — RESP PANEL BY RT-PCR (FLU A&B, COVID) ARPGX2
Influenza A by PCR: NEGATIVE
Influenza B by PCR: NEGATIVE
SARS Coronavirus 2 by RT PCR: NEGATIVE

## 2021-10-14 LAB — URINALYSIS, MICROSCOPIC (REFLEX)

## 2021-10-14 LAB — PROTEIN / CREATININE RATIO, URINE
Creatinine, Urine: 102.55 mg/dL
Protein Creatinine Ratio: 0.13 mg/mg{Cre} (ref 0.00–0.15)
Total Protein, Urine: 13 mg/dL

## 2021-10-14 LAB — CBG MONITORING, ED: Glucose-Capillary: 98 mg/dL (ref 70–99)

## 2021-10-14 MED ORDER — ACETAMINOPHEN 325 MG PO TABS
650.0000 mg | ORAL_TABLET | Freq: Once | ORAL | Status: AC
  Administered 2021-10-14: 650 mg via ORAL
  Filled 2021-10-14: qty 2

## 2021-10-14 MED ORDER — LACTATED RINGERS IV BOLUS
1000.0000 mL | Freq: Once | INTRAVENOUS | Status: AC
  Administered 2021-10-14: 1000 mL via INTRAVENOUS

## 2021-10-14 NOTE — Discharge Instructions (Signed)
Follow-up with your OB providers as scheduled.  Take Tylenol as needed for continued headache pain.  Return to the ED for any worsening symptoms.

## 2021-10-14 NOTE — ED Provider Notes (Signed)
[facility] EMERGENCY DEPARTMENT Provider Note   CSN: [id number] Arrival date & time: 10/14/21  1516     History  Chief Complaint  Patient presents with   Headache    Claire Harrison is a 26 y.o. female.   Headache Associated symptoms: nausea   Associated symptoms: no abdominal pain, no back pain, no congestion, no cough, no diarrhea, no dizziness, no ear pain, no eye pain, no fatigue, no fever, no myalgias, no neck pain, no numbness, no seizures, no sore throat, no vomiting and no weakness   Patient is a 26 year old, G4 P2, female, currently [redacted] weeks pregnant, presenting for a headache.  She does have a history of preeclampsia.  [facility].  Last routine visit was 2 weeks ago.  Today, she reports bifrontal headache over the past 2 days.  She has had associated nausea without vomiting.  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  She is taking a baby aspirin without relief of symptoms.  Her medications include insulin and prenatal vitamins.  She does have diabetes outside of pregnancy.  She does not have hypertension.  During both of her previous pregnancies, she did have headache and proteinuria, consistent with preeclampsia.  She delivered by C-section at 37 weeks and 36 weeks.    Home Medications Prior to Admission medications   Medication Sig Start Date End Date Taking? Authorizing Provider  ACCU-CHEK GUIDE test strip USE 1 STRIP TO CHECK GLUCOSE 4 TIMES DAILY AS  INSTRUCTED 09/26/19   Cassandria Anger, MD  acetaminophen (TYLENOL) 325 MG tablet Take 650 mg by mouth every 6 (six) hours as needed.    [provider]  aspirin 81 MG EC tablet Take 2 tablets (162 mg total) by mouth daily. Swallow whole. 07/01/21   Roma Schanz, CNM  ATROVENT HFA 17 MCG/ACT inhaler Inhale into the lungs. 09/25/21   [provider]  Blood Glucose Monitoring Suppl (ACCU-CHEK GUIDE) w/Device KIT USE AS  DIRECTED 09/26/19   Cassandria Anger, MD  Blood Pressure Monitor MISC For regular home bp monitoring during pregnancy 07/01/21   Roma Schanz, CNM  Doxylamine-Pyridoxine (DICLEGIS) 10-10 MG TBEC 2 tabs q hs, if sx persist add 1 tab q am on day 3, if sx persist add 1 tab q afternoon on day 4 06/17/21   Roma Schanz, CNM  HUMALOG KWIKPEN 100 UNIT/ML KwikPen Inject 8 Units into the skin with breakfast, with lunch, and with evening meal. Patient taking differently: Inject 12 Units into the skin as needed. 07/01/21   Roma Schanz, CNM  Insulin Glargine (LANTUS Bruce) Inject 60 Units into the skin at bedtime.    [provider]  ondansetron (ZOFRAN ODT) 8 MG disintegrating tablet Take 1 tablet (8 mg total) by mouth every 8 (eight) hours as needed for nausea or vomiting. 07/30/21   Florian Buff, MD  Prenatal Vit-Fe Fumarate-FA (PRENATAL VITAMIN PO) Take by mouth daily.     [provider]      Allergies    Penicillins, Bee venom, Mango flavor, Peanut-containing drug products, and Latex    Review of Systems   Review of Systems  Constitutional:  Negative for activity change, appetite change, chills, fatigue and fever.  HENT:  Negative for congestion, ear pain and sore throat.   Eyes:  Positive for visual disturbance. Negative for pain.  Respiratory:  Negative for cough, chest tightness, shortness of breath and  wheezing.   Cardiovascular:  Negative for chest pain and palpitations.  Gastrointestinal:  Positive for nausea. Negative for abdominal pain, constipation, diarrhea and vomiting.  Genitourinary:  Negative for dysuria, flank pain, frequency, hematuria, pelvic pain, vaginal bleeding and vaginal discharge.  Musculoskeletal:  Negative for arthralgias, back pain, gait problem, myalgias and neck pain.  Skin:  Negative for color change and rash.  Neurological:  Positive for headaches. Negative for dizziness, seizures, syncope, facial asymmetry, speech difficulty,  weakness, light-headedness and numbness.  Hematological:  Does not bruise/bleed easily.  Psychiatric/Behavioral:  Negative for confusion and decreased concentration.   All other systems reviewed and are negative.  Physical Exam Updated Vital Signs BP 107/61    Pulse 74    Temp 98.6 F (37 C)    Resp 17    Ht _0  (1.575 m)    Wt 108.9 kg    LMP 03/26/2021    SpO2 99%    BMI 43.90 kg/m  Physical Exam Vitals and nursing note reviewed.  Constitutional:      General: She is not in acute distress.    Appearance: She is well-developed and normal weight. She is not ill-appearing, toxic-appearing or diaphoretic.  HENT:     Head: Normocephalic and atraumatic.  Eyes:     General: No visual field deficit.    Extraocular Movements: Extraocular movements intact.     Conjunctiva/sclera: Conjunctivae normal.  Cardiovascular:     Rate and Rhythm: Normal rate and regular rhythm.     Heart sounds: No murmur heard. Pulmonary:     Effort: Pulmonary effort is normal. No respiratory distress.     Breath sounds: Normal breath sounds. No wheezing.  Chest:     Chest wall: No tenderness.  Abdominal:     Palpations: Abdomen is soft.     Tenderness: There is no abdominal tenderness.     Comments: Gravid uterus  Musculoskeletal:        General: No swelling.     Cervical back: Neck supple.  Skin:    General: Skin is warm and dry.     Capillary Refill: Capillary refill takes less than 2 seconds.  Neurological:     Mental Status: She is alert and oriented to person, place, and time.     Cranial Nerves: No cranial nerve deficit, dysarthria or facial asymmetry.     Sensory: No sensory deficit.     Motor: No weakness.  Psychiatric:        Mood and Affect: Mood normal.        Behavior: Behavior normal.    ED Results / Procedures / Treatments   Labs (all labs ordered are listed, but only abnormal results are displayed) Labs Reviewed  URINE CULTURE - Abnormal; Notable for the following components:       Result Value   Culture MULTIPLE SPECIES PRESENT, SUGGEST RECOLLECTION (*)    All other components within normal limits  URINALYSIS, ROUTINE W REFLEX MICROSCOPIC - Abnormal; Notable for the following components:   Glucose, UA >=500 (*)    All other components within normal limits  CBC WITH DIFFERENTIAL/PLATELET - Abnormal; Notable for the following components:   WBC 11.7 (*)    Neutro Abs 8.4 (*)    Abs Immature Granulocytes 0.15 (*)    All other components within normal limits  COMPREHENSIVE METABOLIC PANEL - Abnormal; Notable for the following components:   CO2 20 (*)    Creatinine, Ser 0.41 (*)    Calcium 8.7 (*)  Albumin 3.1 (*)    AST 14 (*)    Total Bilirubin <0.1 (*)    All other components within normal limits  URINALYSIS, MICROSCOPIC (REFLEX) - Abnormal; Notable for the following components:   Bacteria, UA MANY (*)    All other components within normal limits  RESP PANEL BY RT-PCR (FLU A&B, COVID) ARPGX2  PROTEIN / CREATININE RATIO, URINE  CBG MONITORING, ED    EKG None  Radiology No results found.  Procedures Procedures    Medications Ordered in ED Medications  lactated ringers bolus 1,000 mL (0 mLs Intravenous Stopped 10/14/21 2119)  acetaminophen (TYLENOL) tablet 650 mg (650 mg Oral Given 10/14/21 1739)    ED Course/ Medical Decision Making/ A&P                           Medical Decision Making Amount and/or Complexity of Data Reviewed Labs: ordered.  Risk OTC drugs.   This patient presents to the ED for concern of headache, this involves an extensive number of treatment options, and is a complaint that carries with it a high risk of complications and morbidity.  The differential diagnosis includes preeclampsia, tension headache, migraine headache, dehydration   Co morbidities that complicate the patient evaluation  DM, history of preeclampsia, depression, anxiety   Additional history obtained:  Additional history obtained from  N/A External records from outside source obtained and reviewed including EMR   Lab Tests:  I Ordered, and personally interpreted labs.  The pertinent results include: Normal transaminases, no proteinuria, mild leukocytosis, normal platelets, normal blood glucose, no clear evidence of UTI   Imaging Studies ordered:  I ordered imaging studies including none I independently visualized and interpreted imaging which showed N/A   Cardiac Monitoring:  The patient was maintained on a cardiac monitor.  I personally viewed and interpreted the cardiac monitored which showed an underlying rhythm of: Sinus rhythm Patient was also maintained on tocometry which showed no evidence of contractions and normal fetal heart tones   Medicines ordered and prescription drug management:  I ordered medication including Tylenol for headache; IV fluids for hydration Reevaluation of the patient after these medicines showed that the patient improved I have reviewed the patients home medicines and have made adjustments as needed  Consultations Obtained:  I requested consultation with the OB/GYN,  and discussed lab and imaging findings as well as pertinent plan - they recommend: Discharged with scheduled follow-up on the 19th   Problem List / ED Course:  Patient is a G4, P2 female, currently [redacted] weeks pregnant, presenting for headache and scotomas.  She has a history of preeclampsia in both of her prior deliveries.  Although she is still early in gestation, this does raise concern for preeclampsia as etiology of her recent symptoms.  Per chart review, she was endorsing similar symptoms during her last OB visit 2 weeks ago.  On exam, patient is well-appearing with no focal neurologic deficits, no edema, no right upper quadrant tenderness.  She denies any recent vaginal bleeding or discharge.  She states that baby movement is baseline.  She was kept on bedside cardiac monitor and kept on tocometry as well.  I  discussed this case with OB/GYN early on.  They recommended lab work to assess for preeclampsia with outpatient follow-up if lab work is reassuring.  Patient was given IV fluids and Tylenol.  Lab work was obtained which did not show any evidence of preeclampsia/HELLP.  On reassessment, she  reported mild improvement in her headache.  She was offered Reglan for further management but declined.  She does feel comfortable with discharge at this time.  I spoke with OB/GYN again who did feel comfortable with lab results.  Patient was advised to follow-up with her scheduled prenatal visit and to return to the ED if she does have any worsening of symptoms.   Reevaluation:  After the interventions noted above, I reevaluated the patient and found that they have :improved   Social Determinants of Health:  Patient has frequent follow-up with OB/GYN.   Dispostion:  After consideration of the diagnostic results and the patients response to treatment, I feel that the patent would benefit from discharge and close follow-up..          Final Clinical Impression(s) / ED Diagnoses Final diagnoses:  Bad headache    Rx / DC Orders ED Discharge Orders     None         Godfrey Pick, MD 10/16/21 [phone]

## 2021-10-14 NOTE — Progress Notes (Signed)
Received call from [facility] RN [facility]. Patient [redacted]w[redacted]d presented to the ED with complaints of Headache and floaters for 2 days. BP 116/60. Will monitor patient and contact Attending to review.

## 2021-10-14 NOTE — ED Triage Notes (Addendum)
Pt brought in by [facility] from home with c/o headache and seeing black spots x 2 days. Pt is [redacted] weeks pregnant. Hx preeclampsia. CBG 226. G4,P3. EMS reports- BP 117/69, HR 97, O2 sat 95% on RA.

## 2021-10-14 NOTE — Progress Notes (Signed)
Dr Charlotta Newton notified of pt, MD to call [name] to see if they feel comfortable doing PIH workup.

## 2021-10-14 NOTE — Progress Notes (Signed)
Dr Charlotta Newton called [facility] nurse to say that [facility] is going to run lab work on patient and as long as all results are WNL, pt may be cleared.

## 2021-10-14 NOTE — ED Notes (Signed)
This nurse spoke with Junie Panning, RN from OB rapid response. OB doctor requested a urine culture and discharge home with follow up appointment.

## 2021-10-14 NOTE — Progress Notes (Signed)
Dr Rip Harbour notified of lab results.  MD would like for [facility] to culture urine and for the pt to follow up with [facility].  Alyssa, RN at [facility] notified of this and that pt is OB cleared.

## 2021-10-14 NOTE — ED Notes (Signed)
OB rapid response nurse called to monitor patient.

## 2021-10-15 LAB — URINE CULTURE

## 2021-10-17 ENCOUNTER — Encounter: Payer: Self-pay | Admitting: Women's Health

## 2021-10-17 ENCOUNTER — Other Ambulatory Visit: Payer: Self-pay

## 2021-10-17 ENCOUNTER — Ambulatory Visit (INDEPENDENT_AMBULATORY_CARE_PROVIDER_SITE_OTHER): Payer: Medicaid Other | Admitting: Women's Health

## 2021-10-17 VITALS — BP 102/62 | HR 80 | Wt 249.0 lb

## 2021-10-17 DIAGNOSIS — R1011 Right upper quadrant pain: Secondary | ICD-10-CM

## 2021-10-17 DIAGNOSIS — R110 Nausea: Secondary | ICD-10-CM

## 2021-10-17 DIAGNOSIS — O24319 Unspecified pre-existing diabetes mellitus in pregnancy, unspecified trimester: Secondary | ICD-10-CM

## 2021-10-17 DIAGNOSIS — O0993 Supervision of high risk pregnancy, unspecified, third trimester: Secondary | ICD-10-CM

## 2021-10-17 DIAGNOSIS — Z3A29 29 weeks gestation of pregnancy: Secondary | ICD-10-CM

## 2021-10-17 LAB — POCT URINALYSIS DIPSTICK OB
Blood, UA: NEGATIVE
Glucose, UA: NEGATIVE
Ketones, UA: NEGATIVE
Leukocytes, UA: NEGATIVE
Nitrite, UA: NEGATIVE
POC,PROTEIN,UA: NEGATIVE

## 2021-10-17 NOTE — Patient Instructions (Addendum)
Claire Harrison, thank you for choosing our office today! We appreciate the opportunity to meet your healthcare needs. You may receive a short survey by mail, e-mail, or through [employer]. If you are happy with your care we would appreciate if you could take just a few minutes to complete the survey questions. We read all of your comments and take your feedback very seriously. Thank you again for choosing our office.  Center for [employer] Team at [facility]  [facility] at [facility] ([address] [place], [place] [address]) Entrance C, located off of [address] Free 24/7 valet parking   CLASSES: Go to [email or web address] to register for classes (childbirth, breastfeeding, waterbirth, infant CPR, daddy bootcamp, etc.)  For Headaches:  Stay well hydrated, drink enough water so that your urine is clear, sometimes if you are dehydrated you can get headaches Eat small frequent meals and snacks, sometimes if you are hungry you can get headaches Sometimes you get headaches during pregnancy from the pregnancy hormones You can try tylenol (1-2 regular strength 325mg  or 1-2 extra strength 500mg ) as directed on the box. The least amount of medication that works is best.  Cool compresses (cool wet washcloth or ice pack) to area of head that is hurting You can also try drinking a caffeinated drink to see if this will help If not helping, try below:  For Prevention of Headaches/Migraines: CoQ10 100mg  three times daily Vitamin B2 400mg  daily Magnesium Oxide 400-600mg  daily  If You Get a Bad Headache/Migraine: Benadryl 25mg   Magnesium Oxide 1 large Gatorade 2 extra strength Tylenol (1,000mg  total) 1 cup coffee or Coke   If this doesn't help please call @ [phone]    Call the office [phone]) or go to [facility] if: You begin to have strong, frequent contractions Your water breaks.  Sometimes it is a big gush of fluid, sometimes it is just a trickle that keeps  getting your panties wet or running down your legs You have vaginal bleeding.  It is normal to have a small amount of spotting if your cervix was checked.  You don't feel your baby moving like normal.  If you don't, get you something to eat and drink and lay down and focus on feeling your baby move.   If your baby is still not moving like normal, you should call the office or go to [facility].  Call the office [phone]) or go to [facility] hospital for these signs of pre-eclampsia: Severe headache that does not go away with Tylenol Visual changes- seeing spots, double, blurred vision Pain under your right breast or upper abdomen that does not go away with Tums or heartburn medicine Nausea and/or vomiting Severe swelling in your hands, feet, and face   Tdap Vaccine It is recommended that you get the Tdap vaccine during the third trimester of EACH pregnancy to help protect your baby from getting pertussis (whooping cough) 27-36 weeks is the BEST time to do this so that you can pass the protection on to your baby. During pregnancy is better than after pregnancy, but if you are unable to get it during pregnancy it will be offered at the hospital.  You can get this vaccine with ([phone], at the health department, your family doctor, or some local pharmacies Everyone who will be around your baby should also be up-to-date on their vaccines before the baby comes. Adults (who are not pregnant) only need 1 dose of Tdap during adulthood.   [place]  Pediatricians/Family Doctors [facility]): [address], [phone]           [facility]: [address], [phone]                [facility]): [address], [phone] (call to ask if accepting patients) [facility]: [address], [place], [phone]    [facility] Pediatricians/Family Doctors [facility]): [phone],  [address], [phone] [facility]: [address], [phone] [facility]: [address], [phone]  [facility]  [facility]): [phone] [facility]: [address], [phone]   [facility]: [address], [phone]  [facility]  [facility]: [phone] [address], [phone]  Home Blood Pressure Monitoring for Patients   Your provider has recommended that you check your blood pressure (BP) at least once a week at home. If you do not have a blood pressure cuff at home, one will be provided for you. Contact your provider if you have not received your monitor within 1 week.   Helpful Tips for Accurate Home Blood Pressure Checks  Don't smoke, exercise, or drink caffeine 30 minutes before checking your BP Use the restroom before checking your BP (a full bladder can raise your pressure) Relax in a comfortable upright chair Feet on the ground Left arm resting comfortably on a flat surface at the level of your heart Legs uncrossed Back supported Sit quietly and don't talk Place the cuff on your bare arm Adjust snuggly, so that only two fingertips can fit between your skin and the top of the cuff Check 2 readings separated by at least one minute Keep a log of your BP readings For a visual, please reference this diagram: http://ccnc.care/bpdiagram  Provider Name: [facility]     Phone: [phone]  Zone 1: ALL CLEAR  Continue to monitor your symptoms:  BP reading is less than 140 (top number) or less than 90 (bottom number)  No right upper stomach pain No headaches or seeing spots No feeling nauseated or throwing up No swelling in face and hands  Zone 2: CAUTION Call your doctor's office for any of the following:  BP reading is greater than 140 (top number) or greater than 90 (bottom number)   Stomach pain under your ribs in the middle or right side Headaches or seeing spots Feeling nauseated or throwing up Swelling in face and hands  Zone 3: EMERGENCY  Seek immediate medical care if you have any of the following:  BP reading is greater than160 (top number) or greater than 110 (bottom number) Severe headaches not improving with Tylenol Serious difficulty catching your breath Any worsening symptoms from Zone 2   Third Trimester of Pregnancy The third trimester is from week 29 through week 42, months 7 through 9. The third trimester is a time when the fetus is growing rapidly. At the end of the ninth month, the fetus is about 20 inches in length and weighs 6-10 pounds.  BODY CHANGES Your body goes through many changes during pregnancy. The changes vary from woman to woman.  Your weight will continue to increase. You can expect to gain 25-35 pounds (11-16 kg) by the end of the pregnancy. You may begin to get stretch marks on your hips, abdomen, and breasts. You may urinate more  often because the fetus is moving lower into your pelvis and pressing on your bladder. You may develop or continue to have heartburn as a result of your pregnancy. You may develop constipation because certain hormones are causing the muscles that push waste through your intestines to slow down. You may develop hemorrhoids or swollen, bulging veins (varicose veins). You may have pelvic pain because of the weight gain and pregnancy hormones relaxing your joints between the bones in your pelvis. Backaches may result from overexertion of the muscles supporting your posture. You may have changes in your hair. These can include thickening of your hair, rapid growth, and changes in texture. Some women also have hair loss during or after pregnancy, or hair that feels dry or thin. Your hair will most likely return to normal after your baby is born. Your breasts will continue to grow and be tender. A yellow discharge  may leak from your breasts called colostrum. Your belly button may stick out. You may feel short of breath because of your expanding uterus. You may notice the fetus "dropping," or moving lower in your abdomen. You may have a bloody mucus discharge. This usually occurs a few days to a week before labor begins. Your cervix becomes thin and soft (effaced) near your due date. WHAT TO EXPECT AT YOUR PRENATAL EXAMS  You will have prenatal exams every 2 weeks until week 36. Then, you will have weekly prenatal exams. During a routine prenatal visit: You will be weighed to make sure you and the fetus are growing normally. Your blood pressure is taken. Your abdomen will be measured to track your baby's growth. The fetal heartbeat will be listened to. Any test results from the previous visit will be discussed. You may have a cervical check near your due date to see if you have effaced. At around 36 weeks, your caregiver will check your cervix. At the same time, your caregiver will also perform a test on the secretions of the vaginal tissue. This test is to determine if a type of bacteria, Group B streptococcus, is present. Your caregiver will explain this further. Your caregiver may ask you: What your birth plan is. How you are feeling. If you are feeling the baby move. If you have had any abnormal symptoms, such as leaking fluid, bleeding, severe headaches, or abdominal cramping. If you have any questions. Other tests or screenings that may be performed during your third trimester include: Blood tests that check for low iron levels (anemia). Fetal testing to check the health, activity level, and growth of the fetus. Testing is done if you have certain medical conditions or if there are problems during the pregnancy. FALSE LABOR You may feel small, irregular contractions that eventually go away. These are called Braxton Hicks contractions, or false labor. Contractions may last for hours, days, or even  weeks before true labor sets in. If contractions come at regular intervals, intensify, or become painful, it is best to be seen by your caregiver.  SIGNS OF LABOR  Menstrual-like cramps. Contractions that are 5 minutes apart or less. Contractions that start on the top of the uterus and spread down to the lower abdomen and back. A sense of increased pelvic pressure or back pain. A watery or bloody mucus discharge that comes from the vagina. If you have any of these signs before the 37th week of pregnancy, call your caregiver right away. You need to go to the hospital to get checked immediately. HOME CARE INSTRUCTIONS  Avoid  all smoking, herbs, alcohol, and unprescribed drugs. These chemicals affect the formation and growth of the baby. Follow your caregiver's instructions regarding medicine use. There are medicines that are either safe or unsafe to take during pregnancy. Exercise only as directed by your caregiver. Experiencing uterine cramps is a good sign to stop exercising. Continue to eat regular, healthy meals. Wear a good support bra for breast tenderness. Do not use hot tubs, steam rooms, or saunas. Wear your seat belt at all times when driving. Avoid raw meat, uncooked cheese, cat litter boxes, and soil used by cats. These carry germs that can cause birth defects in the baby. Take your prenatal vitamins. Try taking a stool softener (if your caregiver approves) if you develop constipation. Eat more high-fiber foods, such as fresh vegetables or fruit and whole grains. Drink plenty of fluids to keep your urine clear or pale yellow. Take warm sitz baths to soothe any pain or discomfort caused by hemorrhoids. Use hemorrhoid cream if your caregiver approves. If you develop varicose veins, wear support hose. Elevate your feet for 15 minutes, 3-4 times a day. Limit salt in your diet. Avoid heavy lifting, wear low heal shoes, and practice good posture. Rest a lot with your legs elevated if you  have leg cramps or low back pain. Visit your dentist if you have not gone during your pregnancy. Use a soft toothbrush to brush your teeth and be gentle when you floss. A sexual relationship may be continued unless your caregiver directs you otherwise. Do not travel far distances unless it is absolutely necessary and only with the approval of your caregiver. Take prenatal classes to understand, practice, and ask questions about the labor and delivery. Make a trial run to the hospital. Pack your hospital bag. Prepare the baby's nursery. Continue to go to all your prenatal visits as directed by your caregiver. SEEK MEDICAL CARE IF: You are unsure if you are in labor or if your water has broken. You have dizziness. You have mild pelvic cramps, pelvic pressure, or nagging pain in your abdominal area. You have persistent nausea, vomiting, or diarrhea. You have a bad smelling vaginal discharge. You have pain with urination. SEEK IMMEDIATE MEDICAL CARE IF:  You have a fever. You are leaking fluid from your vagina. You have spotting or bleeding from your vagina. You have severe abdominal cramping or pain. You have rapid weight loss or gain. You have shortness of breath with chest pain. You notice sudden or extreme swelling of your face, hands, ankles, feet, or legs. You have not felt your baby move in over an hour. You have severe headaches that do not go away with medicine. You have vision changes. Document Released: 09/09/2001 Document Revised: 09/20/2013 Document Reviewed: 11/16/2012 [facility] Patient Information 2015 [employer] [place] [place]. This information is not intended to replace advice given to you by your health care provider. Make sure you discuss any questions you have with your health care provider.

## 2021-10-17 NOTE — Progress Notes (Signed)
HIGH-RISK PREGNANCY VISIT Patient name: Claire Harrison MRN [id number]  Date of birth: 1996/05/22 Chief Complaint:   Routine Prenatal Visit (Having headaches, blurred vision, nausea and dizziness; tenderness in right side of belly)  History of Present Illness:   Claire Harrison is a 26 y.o. [id number] female at [redacted]w[redacted]d with an Estimated Date of Delivery: 12/31/21 being seen today for ongoing management of a high-risk pregnancy complicated by diabetes mellitus T2DM currently on humalog 10-12u TID and Lantus 55u.   Today she reports  FBS 61-164 (only 2 >95), 2hr pp 70-192 (16 of 44 >120) . Was sick at beginning of January and then again 1/13-1/16. Has more cravings, eating things she shouldn't. Headaches, taking 500mg  apap and not helping. Blurred vision, nausea after meals, dizziness. Epigastric and RUQ pain. Has h/o pre-e x 2 and is concerned. Home bp's have been great.  Contractions: Not present. Vag. Bleeding: None.  Movement: Present. denies leaking of fluid.   Depression screen [facility] 2/9 07/01/2021 05/17/2021 10/06/2018 10/13/2016 09/15/2016  Decreased Interest 1 1 3  0 0  Down, Depressed, Hopeless 1 0 3 1 2   PHQ - 2 Score 2 1 6 1 2   Altered sleeping 2 1 - 0 0  Tired, decreased energy 2 2 3  0 0  Change in appetite 1 0 0 0 0  Feeling bad or failure about yourself  0 0 2 0 0  Trouble concentrating 0 0 0 0 0  Moving slowly or fidgety/restless 0 0 0 0 0  Suicidal thoughts 0 0 0 0 0  PHQ-9 Score 7 4 - 1 2  Difficult doing work/chores - - Very difficult - -     GAD 7 : Generalized Anxiety Score 07/01/2021 05/17/2021  Nervous, Anxious, on Edge 2 1  Control/stop worrying 1 1  Worry too much - different things 1 0  Trouble relaxing 1 0  Restless 0 0  Easily annoyed or irritable 1 1  Afraid - awful might happen 1 0  Total GAD 7 Score 7 3     Review of Systems:   Pertinent items are noted in HPI Denies abnormal vaginal discharge w/ itching/odor/irritation, headaches, visual changes, shortness of  breath, chest pain, abdominal pain, severe nausea/vomiting, or problems with urination or bowel movements unless otherwise stated above. Pertinent History Reviewed:  Reviewed past medical,surgical, social, obstetrical and family history.  Reviewed problem list, medications and allergies. Physical Assessment:   Vitals:   10/17/21 0840  BP: 102/62  Pulse: 80  Weight: 249 lb (112.9 kg)  Body mass index is 45.54 kg/m.           Physical Examination:   General appearance: alert, well appearing, and in no distress  Mental status: alert, oriented to person, place, and time  Skin: warm & dry   Extremities: Edema: Trace    Cardiovascular: normal heart rate noted  Respiratory: normal respiratory effort, no distress  Abdomen: gravid, epigastric and RUQ tender to palpation  Pelvic: Cervical exam deferred         Fetal Status: Fetal Heart Rate (bpm): 152 Fundal Height: 31 cm Movement: Present    Fetal Surveillance Testing today: doppler   Chaperone: N/A    Results for orders placed or performed in visit on 10/17/21 (from the past 24 hour(s))  POC Urinalysis Dipstick OB   Collection Time: 10/17/21  8:35 AM  Result Value Ref Range   Color, UA     Clarity, UA     Glucose, UA Negative Negative  Bilirubin, UA     Ketones, UA neg    Spec Grav, UA     Blood, UA neg    pH, UA     POC,PROTEIN,UA Negative Negative, Trace, Small (1+), Moderate (2+), Large (3+), 4+   Urobilinogen, UA     Nitrite, UA neg    Leukocytes, UA Negative Negative   Appearance     Odor      Assessment & Plan:  High-risk pregnancy: [id number] at [redacted]w[redacted]d with an Estimated Date of Delivery: 12/31/21   1) T2DM, sugars are all over the place, has had 2 recent illnesses (flu & covid), reviewed sugars w/ LHE, keep current regimen of humalog 10-12u TID, lantus 55u qhs. Control cravings, be more consistent w/ diet.  EFW 56% @ 27wks  2) H/O pre-e x 2, bp's great, no proteinuria, keep checking home bp's- if starts to elevate let  us know. Reviewed pre-e s/s, reasons to seek care  3) RUQ/epigastric pain w/ nausea after meals> will get gallbladder u/s, has nausea meds that help  4) Prev c/s x2> for RCS  5) Headaches> gave printed prevention/relief measures    Meds: No orders of the defined types were placed in this encounter.   Labs/procedures today: none  Treatment Plan:  Growth u/s q4wks       2x/wk testing or weekly BPP @ 32wks    Deliver @ 39wks:_____   Reviewed: Preterm labor symptoms and general obstetric precautions including but not limited to vaginal bleeding, contractions, leaking of fluid and fetal movement were reviewed in detail with the patient.  All questions were answered. Does have home bp cuff. Office bp cuff given: not applicable. Check bp weekly, let us know if consistently >140 and/or >90.  Follow-up: Return in about 2 weeks (around 10/31/2021) for [place], [place]: EFW/BPP, MD only (for remainder of pregnancy- is on insulin).   Future Appointments  Date Time Provider [place]  10/25/2021 10:30 AM AP-US 5 AP-US [place]    Orders Placed This Encounter  Procedures   US Abdomen Limited RUQ (LIVER/GB)   POC Urinalysis Dipstick OB   Roma Schanz CNM, [facility] 10/17/2021 11:11 AM

## 2021-10-24 ENCOUNTER — Other Ambulatory Visit: Payer: Medicaid Other

## 2021-10-24 ENCOUNTER — Encounter: Payer: Medicaid Other | Admitting: Obstetrics & Gynecology

## 2021-10-25 ENCOUNTER — Other Ambulatory Visit: Payer: Self-pay

## 2021-10-25 ENCOUNTER — Ambulatory Visit (HOSPITAL_COMMUNITY)
Admission: RE | Admit: 2021-10-25 | Discharge: 2021-10-25 | Disposition: A | Discharge status: Home / Self Care | Payer: Medicaid Other | Source: Ambulatory Visit | Attending: Women's Health | Admitting: Women's Health

## 2021-10-25 DIAGNOSIS — O0993 Supervision of high risk pregnancy, unspecified, third trimester: Secondary | ICD-10-CM | POA: Insufficient documentation

## 2021-10-25 DIAGNOSIS — R110 Nausea: Secondary | ICD-10-CM | POA: Insufficient documentation

## 2021-10-25 DIAGNOSIS — Z3A29 29 weeks gestation of pregnancy: Secondary | ICD-10-CM | POA: Insufficient documentation

## 2021-10-25 DIAGNOSIS — R1011 Right upper quadrant pain: Secondary | ICD-10-CM | POA: Diagnosis present

## 2021-10-28 ENCOUNTER — Other Ambulatory Visit: Payer: Self-pay

## 2021-10-28 ENCOUNTER — Ambulatory Visit (INDEPENDENT_AMBULATORY_CARE_PROVIDER_SITE_OTHER): Payer: Medicaid Other

## 2021-10-28 VITALS — BP 118/70 | HR 80 | Wt 251.0 lb

## 2021-10-28 DIAGNOSIS — O0993 Supervision of high risk pregnancy, unspecified, third trimester: Secondary | ICD-10-CM

## 2021-10-28 LAB — POCT URINALYSIS DIPSTICK OB
Blood, UA: NEGATIVE
Glucose, UA: NEGATIVE
Ketones, UA: NEGATIVE
Leukocytes, UA: NEGATIVE
Nitrite, UA: NEGATIVE
POC,PROTEIN,UA: NEGATIVE

## 2021-10-28 NOTE — Progress Notes (Signed)
° °  NURSE VISIT- BLOOD PRESSURE CHECK  SUBJECTIVE:  Claire Harrison is a 26 y.o. [phone] female here for BP check. She is a GYN patient    HYPERTENSION ROS:  Pregnant:  Severe headaches that don't go away with tylenol/other medicines:  Mild daily headaches, Tylenol doesn't help Visual changes (seeing spots/double/blurred vision)  Yes, floaters when eyes open, bright flashes of light when eyes closed Severe pain under right breast breast or in center of upper chest No  Severe nausea/vomiting  some mild with acid reflux Taking medicines as instructed not applicable  OBJECTIVE:  BP 118/70    Pulse 80    Wt 251 lb (113.9 kg)    LMP 03/26/2021    BMI 45.91 kg/m   Appearance alert, well appearing, and in no distress and oriented to person, place, and time.  ASSESSMENT: Pregnancy [redacted]w[redacted]d  blood pressure check  PLAN: Discussed with Dr. Despina Hidden   Recommendations: no changes needed   Follow-up: as scheduled   Fauna Neuner A Jase Himmelberger  10/28/2021 11:51 AM

## 2021-11-01 ENCOUNTER — Other Ambulatory Visit: Payer: Self-pay

## 2021-11-01 ENCOUNTER — Encounter: Payer: Self-pay | Admitting: Obstetrics & Gynecology

## 2021-11-01 ENCOUNTER — Ambulatory Visit (INDEPENDENT_AMBULATORY_CARE_PROVIDER_SITE_OTHER): Payer: Medicaid Other | Admitting: Obstetrics & Gynecology

## 2021-11-01 VITALS — BP 128/70 | HR 98 | Wt 252.0 lb

## 2021-11-01 DIAGNOSIS — O0993 Supervision of high risk pregnancy, unspecified, third trimester: Secondary | ICD-10-CM

## 2021-11-01 DIAGNOSIS — O24319 Unspecified pre-existing diabetes mellitus in pregnancy, unspecified trimester: Secondary | ICD-10-CM

## 2021-11-01 DIAGNOSIS — Z1389 Encounter for screening for other disorder: Secondary | ICD-10-CM

## 2021-11-01 DIAGNOSIS — Z331 Pregnant state, incidental: Secondary | ICD-10-CM

## 2021-11-01 LAB — POCT URINALYSIS DIPSTICK OB
Glucose, UA: NEGATIVE
Ketones, UA: NEGATIVE
Leukocytes, UA: NEGATIVE
Nitrite, UA: NEGATIVE
POC,PROTEIN,UA: NEGATIVE

## 2021-11-01 MED ORDER — INSULIN GLARGINE 100 UNIT/ML ~~LOC~~ SOLN: 60.0000 [IU] | Freq: Every day | SUBCUTANEOUS | 11 refills | Status: DC

## 2021-11-01 MED ORDER — HUMALOG KWIKPEN 100 UNIT/ML ~~LOC~~ SOPN: PEN_INJECTOR | SUBCUTANEOUS | 11 refills | Status: DC

## 2021-11-01 NOTE — Progress Notes (Signed)
HIGH-RISK PREGNANCY VISIT Patient name: Claire Harrison MRN [id number]  Date of birth: 1996-01-09 Chief Complaint:   Routine Prenatal Visit (NST)  History of Present Illness:   Claire Harrison is a 26 y.o. [id number] female at [redacted]w[redacted]d with an Estimated Date of Delivery: 12/31/21 being seen today for ongoing management of a high-risk pregnancy complicated by diabetes mellitus T2DM: Class B, currently on lantus and humalog .    Today she reports no complaints. Contractions: Not present.  .  Movement: Present. denies leaking of fluid.   Depression screen [facility] 2/9 07/01/2021 05/17/2021 10/06/2018 10/13/2016 09/15/2016  Decreased Interest 1 1 3  0 0  Down, Depressed, Hopeless 1 0 3 1 2   PHQ - 2 Score 2 1 6 1 2   Altered sleeping 2 1 - 0 0  Tired, decreased energy 2 2 3  0 0  Change in appetite 1 0 0 0 0  Feeling bad or failure about yourself  0 0 2 0 0  Trouble concentrating 0 0 0 0 0  Moving slowly or fidgety/restless 0 0 0 0 0  Suicidal thoughts 0 0 0 0 0  PHQ-9 Score 7 4 - 1 2  Difficult doing work/chores - - Very difficult - -     GAD 7 : Generalized Anxiety Score 07/01/2021 05/17/2021  Nervous, Anxious, on Edge 2 1  Control/stop worrying 1 1  Worry too much - different things 1 0  Trouble relaxing 1 0  Restless 0 0  Easily annoyed or irritable 1 1  Afraid - awful might happen 1 0  Total GAD 7 Score 7 3     Review of Systems:   Pertinent items are noted in HPI Denies abnormal vaginal discharge w/ itching/odor/irritation, headaches, visual changes, shortness of breath, chest pain, abdominal pain, severe nausea/vomiting, or problems with urination or bowel movements unless otherwise stated above. Pertinent History Reviewed:  Reviewed past medical,surgical, social, obstetrical and family history.  Reviewed problem list, medications and allergies. Physical Assessment:   Vitals:   11/01/21 1158  BP: 128/70  Pulse: 98  Weight: 252 lb (114.3 kg)  Body mass index is 46.09 kg/m.            Physical Examination:   General appearance: alert, well appearing, and in no distress  Mental status: alert, oriented to person, place, and time  Skin: warm & dry   Extremities: Edema: Trace    Cardiovascular: normal heart rate noted  Respiratory: normal respiratory effort, no distress  Abdomen: gravid, soft, non-tender  Pelvic: Cervical exam deferred         Fetal Status:     Movement: Present    Fetal Surveillance Testing today: Reactive NST  Claire Harrison is at [redacted]w[redacted]d Estimated Date of Delivery: 12/31/21  NST being performed due to B DM  Today the NST is Reactive  Fetal Monitoring:  Baseline: 140 bpm, Variability: Good {> 6 bpm), Accelerations: Reactive, and Decelerations: Absent   reactive  The accelerations are >15 bpm and more than 2 in 20 minutes  Final diagnosis:  Reactive NST  Florian Buff, MD     Chaperone: N/A    Results for orders placed or performed in visit on 11/01/21 (from the past 24 hour(s))  POC Urinalysis Dipstick OB   Collection Time: 11/01/21 12:05 PM  Result Value Ref Range   Color, UA     Clarity, UA     Glucose, UA Negative Negative   Bilirubin, UA     Ketones, UA neg  Spec Grav, UA     Blood, UA small    pH, UA     POC,PROTEIN,UA Negative Negative, Trace, Small (1+), Moderate (2+), Large (3+), 4+   Urobilinogen, UA     Nitrite, UA neg    Leukocytes, UA Negative Negative   Appearance     Odor      Assessment & Plan:  High-risk pregnancy: [id number] at [redacted]w[redacted]d with an Estimated Date of Delivery: 12/31/21   1) Class B DM on lantus 60 hs humalog 12 with each meal, acceptable control,     Meds:  Meds ordered this encounter  Medications   HUMALOG KWIKPEN 100 UNIT/ML KwikPen    Sig: 12 units with each meal    Dispense:  3 mL    Refill:  11   insulin glargine (LANTUS) 100 UNIT/ML injection    Sig: Inject 0.6 mLs (60 Units total) into the skin at bedtime.    Dispense:  10 mL    Refill:  11    Labs/procedures today: NST  Treatment  Plan:  twice weekly surveillance, EFW 36 weeks, IOL 39 weeks    Follow-up: Return for keep scheduled.   Future Appointments  Date Time Provider [place]  11/05/2021 10:10 AM [facility] NURSE [facility]  11/08/2021  9:10 AM Myrtis Ser, CNM [facility]  11/15/2021  9:10 AM [facility] NURSE [facility]  11/15/2021  9:50 AM Florian Buff, MD [facility]  11/19/2021  9:50 AM [facility] NURSE [facility]  11/22/2021  8:30 AM [place] - [facility] [place] [facility] None  11/22/2021  9:30 AM Janyth Pupa, DO [facility]  11/26/2021  9:30 AM [facility] NURSE [facility]  11/29/2021 10:30 AM Florian Buff, MD [facility]  12/03/2021  9:30 AM [facility] NURSE [facility]  12/06/2021  9:10 AM [facility] NURSE [facility]  12/06/2021  9:50 AM Florian Buff, MD [facility]  12/10/2021  9:50 AM [facility] NURSE [facility]  12/13/2021  9:50 AM Florian Buff, MD [facility]  12/17/2021  9:50 AM [facility] NURSE [facility]  12/20/2021  8:30 AM [place] - [facility] [place] [facility] None  12/20/2021  9:50 AM Janyth Pupa, DO [facility]  12/24/2021  9:30 AM [facility] NURSE [facility]  12/27/2021  9:50 AM Charley Lafrance, Mertie Clause, MD [facility]    Orders Placed This Encounter  Procedures   POC Urinalysis Dipstick OB   Florian Buff  Attending Physician for the [facility] 11/01/2021 12:57 PM

## 2021-11-05 ENCOUNTER — Ambulatory Visit (INDEPENDENT_AMBULATORY_CARE_PROVIDER_SITE_OTHER): Payer: Medicaid Other | Admitting: *Deleted

## 2021-11-05 ENCOUNTER — Other Ambulatory Visit: Payer: Self-pay

## 2021-11-05 VITALS — BP 118/78 | HR 88 | Wt 254.0 lb

## 2021-11-05 DIAGNOSIS — Z1389 Encounter for screening for other disorder: Secondary | ICD-10-CM

## 2021-11-05 DIAGNOSIS — O0993 Supervision of high risk pregnancy, unspecified, third trimester: Secondary | ICD-10-CM

## 2021-11-05 DIAGNOSIS — E119 Type 2 diabetes mellitus without complications: Secondary | ICD-10-CM | POA: Diagnosis not present

## 2021-11-05 DIAGNOSIS — O288 Other abnormal findings on antenatal screening of mother: Secondary | ICD-10-CM

## 2021-11-05 DIAGNOSIS — Z794 Long term (current) use of insulin: Secondary | ICD-10-CM

## 2021-11-05 DIAGNOSIS — Z331 Pregnant state, incidental: Secondary | ICD-10-CM

## 2021-11-05 DIAGNOSIS — Z3A32 32 weeks gestation of pregnancy: Secondary | ICD-10-CM

## 2021-11-05 LAB — POCT URINALYSIS DIPSTICK OB
Blood, UA: NEGATIVE
Glucose, UA: NEGATIVE
Ketones, UA: NEGATIVE
Leukocytes, UA: NEGATIVE
Nitrite, UA: NEGATIVE

## 2021-11-05 NOTE — Progress Notes (Signed)
° °  NURSE VISIT- NST  SUBJECTIVE:  Claire Harrison is a 26 y.o. [phone] female at [redacted]w[redacted]d, here for a NST for pregnancy complicated by T2DM: Class B, currently on Humalog and Lantus .  She reports active fetal movement, contractions: none, vaginal bleeding: none, membranes: intact. States she is concerned this baby is not very active at night but more during the day where as her daughter was mostly active at night.   OBJECTIVE:  BP 118/78    Pulse 88    Wt 254 lb (115.2 kg)    LMP 03/26/2021    BMI 46.46 kg/m   Appears well, no apparent distress  Results for orders placed or performed in visit on 11/05/21 (from the past 24 hour(s))  POC Urinalysis Dipstick OB   Collection Time: 11/05/21 10:36 AM  Result Value Ref Range   Color, UA     Clarity, UA     Glucose, UA Negative Negative   Bilirubin, UA     Ketones, UA neg    Spec Grav, UA     Blood, UA neg    pH, UA     POC,PROTEIN,UA Trace Negative, Trace, Small (1+), Moderate (2+), Large (3+), 4+   Urobilinogen, UA     Nitrite, UA neg    Leukocytes, UA Negative Negative   Appearance     Odor      NST: FHR baseline 145 bpm, Variability: moderate, Accelerations:present, Decelerations:  Absent= Cat 1/reactive Toco: none   ASSESSMENT: [id number] at [redacted]w[redacted]d with T2DM: Class B, currently on Humalog and Lantus NST reactive Discussed baby movements for each pregnancy may be different but to do kick counts as advised, however, every baby will have different movement patters. Should keep track of this one and not compare to last pregnancy.  PLAN: EFM strip reviewed by Dr. Charlotta Newton   Recommendations: keep next appointment as scheduled    [name]  11/05/2021 10:56 AM

## 2021-11-08 ENCOUNTER — Other Ambulatory Visit: Payer: Self-pay

## 2021-11-08 ENCOUNTER — Ambulatory Visit (INDEPENDENT_AMBULATORY_CARE_PROVIDER_SITE_OTHER): Payer: Medicaid Other | Admitting: Advanced Practice Midwife

## 2021-11-08 VITALS — BP 133/83 | HR 89 | Wt 257.0 lb

## 2021-11-08 DIAGNOSIS — Z794 Long term (current) use of insulin: Secondary | ICD-10-CM

## 2021-11-08 DIAGNOSIS — Z1389 Encounter for screening for other disorder: Secondary | ICD-10-CM

## 2021-11-08 DIAGNOSIS — Z3A32 32 weeks gestation of pregnancy: Secondary | ICD-10-CM

## 2021-11-08 DIAGNOSIS — O288 Other abnormal findings on antenatal screening of mother: Secondary | ICD-10-CM

## 2021-11-08 DIAGNOSIS — E119 Type 2 diabetes mellitus without complications: Secondary | ICD-10-CM

## 2021-11-08 DIAGNOSIS — Z331 Pregnant state, incidental: Secondary | ICD-10-CM

## 2021-11-08 DIAGNOSIS — O0993 Supervision of high risk pregnancy, unspecified, third trimester: Secondary | ICD-10-CM

## 2021-11-08 LAB — POCT URINALYSIS DIPSTICK OB
Blood, UA: NEGATIVE
Glucose, UA: NEGATIVE
Ketones, UA: NEGATIVE
Leukocytes, UA: NEGATIVE
Nitrite, UA: NEGATIVE
POC,PROTEIN,UA: NEGATIVE

## 2021-11-08 MED ORDER — BUTALBITAL-APAP-CAFFEINE 50-325-40 MG PO TABS: 1.0000 | ORAL_TABLET | Freq: Four times a day (QID) | ORAL | 0 refills | Status: DC | PRN

## 2021-11-08 NOTE — Progress Notes (Signed)
HIGH-RISK PREGNANCY VISIT Patient name: Claire Harrison MRN [id number]  Date of birth: [birth year] Chief Complaint:   Non-stress Test (Pressure last 2 day and diarrhea x 3 day)  History of Present Illness:   Claire Harrison is a 26 y.o. [phone] female at [redacted]w[redacted]d with an Estimated Date of Delivery: 12/31/21 being seen today for ongoing management of a high-risk pregnancy complicated by diabetes mellitus T2DM: Class B, currently on Lantus 60u, Humalog 12u TID .  Most lunch and dinner values elevated and some b'fast in the past week. Also reports diarrhea x past 3d which is improving- no N/V or fever. Has been having more freq H/A- almost daily- with minimal relief with Tylenol. BP log shows normal values. Also with low back pain and pelvic pressure.  Today she reports  (see above) . Contractions: Irritability.  .  Movement: Present. denies leaking of fluid.   Depression screen [facility] 2/9 07/01/2021 05/17/2021 10/06/2018 10/13/2016 09/15/2016  Decreased Interest 1 1 3  0 0  Down, Depressed, Hopeless 1 0 3 1 2   PHQ - 2 Score 2 1 6 1 2   Altered sleeping 2 1 - 0 0  Tired, decreased energy 2 2 3  0 0  Change in appetite 1 0 0 0 0  Feeling bad or failure about yourself  0 0 2 0 0  Trouble concentrating 0 0 0 0 0  Moving slowly or fidgety/restless 0 0 0 0 0  Suicidal thoughts 0 0 0 0 0  PHQ-9 Score 7 4 - 1 2  Difficult doing work/chores - - Very difficult - -     GAD 7 : Generalized Anxiety Score 07/01/2021 05/17/2021  Nervous, Anxious, on Edge 2 1  Control/stop worrying 1 1  Worry too much - different things 1 0  Trouble relaxing 1 0  Restless 0 0  Easily annoyed or irritable 1 1  Afraid - awful might happen 1 0  Total GAD 7 Score 7 3     Review of Systems:   Pertinent items are noted in HPI Denies abnormal vaginal discharge w/ itching/odor/irritation, headaches, visual changes, shortness of breath, chest pain, abdominal pain, severe nausea/vomiting, or problems with urination or bowel movements  unless otherwise stated above. Pertinent History Reviewed:  Reviewed past medical,surgical, social, obstetrical and family history.  Reviewed problem list, medications and allergies. Physical Assessment:   Vitals:   11/08/21 0842  BP: 133/83  Pulse: 89  Weight: 257 lb (116.6 kg)  Body mass index is 47.01 kg/m.           Physical Examination:   General appearance: alert, well appearing, and in no distress  Mental status: alert, oriented to person, place, and time  Skin: warm & dry   Extremities: Edema: Trace    Cardiovascular: normal heart rate noted  Respiratory: normal respiratory effort, no distress  Abdomen: gravid, soft, non-tender  Pelvic: Cervical exam deferred         Fetal Status:     Movement: Present    Fetal Surveillance Testing today: NST- baseline 150s, +accels, occ mi variables, no ctx    Results for orders placed or performed in visit on 11/08/21 (from the past 24 hour(s))  POC Urinalysis Dipstick OB   Collection Time: 11/08/21  9:03 AM  Result Value Ref Range   Color, UA     Clarity, UA     Glucose, UA Negative Negative   Bilirubin, UA     Ketones, UA neg    Spec Grav, UA  Blood, UA neg    pH, UA     POC,PROTEIN,UA Negative Negative, Trace, Small (1+), Moderate (2+), Large (3+), 4+   Urobilinogen, UA     Nitrite, UA neg    Leukocytes, UA Negative Negative   Appearance     Odor      Assessment & Plan:  High-risk pregnancy: [id number] at [redacted]w[redacted]d with an Estimated Date of Delivery: 12/31/21   1) T2DM, unstable PP values, mostly lunch/dinner; per Dr Nelda Marseille will increase Humalog to 14u TID and may need Lantus change next week  2) Previous C/S x 2, will schedule soon for repeat @ 39wks  3) Headaches, make sure she is drinking at least 64oz water qd; sent in [place] for prn use; always check BP and call with value >140/90 x 2, 15 mins apart  4) Diarrhea, for past 3 days, seems to be improving and now down to 2-3x/day; increase fluid and eat bland diet  until it resolves  5) Discomforts of late preg, reviewed the difference between LBP/pelvic pressure of late preg vs PTL and reviewed when to call  6) May need fetal echo if haven't had already, msg to Tish to schedule if she still needs it (I can't find report)  Meds:  Meds ordered this encounter  Medications   butalbital-acetaminophen-caffeine (FIORICET) 50-325-40 MG tablet    Sig: Take 1-2 tablets by mouth every 6 (six) hours as needed for headache.    Dispense:  20 tablet    Refill:  0    Order Specific Question:   Supervising Provider    Answer:   Janyth Pupa [id number]    Labs/procedures today: NST  Treatment Plan:  continue ante testing/growth scans; rLTCS @ 39wks  Reviewed: Preterm labor symptoms and general obstetric precautions including but not limited to vaginal bleeding, contractions, leaking of fluid and fetal movement were reviewed in detail with the patient.  All questions were answered. Does have home bp cuff. Office bp cuff given: not applicable. Check bp daily, let us know if consistently >140 and/or >90.  Follow-up: Return for As scheduled.   Future Appointments  Date Time Provider [place]  11/15/2021  9:10 AM [facility] NURSE [facility]  11/15/2021  9:50 AM Florian Buff, MD [facility]  11/19/2021  9:50 AM [facility] NURSE [facility]  11/22/2021  8:30 AM [place] - [facility] [place] [facility] None  11/22/2021  9:30 AM Janyth Pupa, DO [facility]  11/26/2021  9:30 AM [facility] NURSE [facility]  11/29/2021 10:30 AM Florian Buff, MD [facility]  12/03/2021  9:30 AM [facility] NURSE [facility]  12/06/2021  9:10 AM [facility] NURSE [facility]  12/06/2021  9:50 AM Florian Buff, MD [facility]  12/10/2021  9:50 AM [facility] NURSE [facility]  12/13/2021  9:50 AM Florian Buff, MD [facility]  12/17/2021  9:50 AM [facility] NURSE [facility]  12/20/2021  8:30 AM [place] - [facility] [place] [facility] None  12/20/2021  9:50 AM Janyth Pupa, DO [facility]  12/24/2021  9:30 AM [facility] NURSE [facility]  12/27/2021  9:50 AM [place], Mertie Clause, MD [facility]    Orders Placed This Encounter  Procedures   POC Urinalysis Dipstick OB   Myrtis Ser  Attending Physician for the [facility] [place] [facility] 11/08/2021 11:38 AM

## 2021-11-12 ENCOUNTER — Other Ambulatory Visit: Payer: Medicaid Other

## 2021-11-13 ENCOUNTER — Encounter: Payer: Self-pay | Admitting: Obstetrics & Gynecology

## 2021-11-15 ENCOUNTER — Other Ambulatory Visit: Payer: Self-pay

## 2021-11-15 ENCOUNTER — Ambulatory Visit (INDEPENDENT_AMBULATORY_CARE_PROVIDER_SITE_OTHER): Payer: Medicaid Other | Admitting: Obstetrics & Gynecology

## 2021-11-15 ENCOUNTER — Telehealth: Payer: Self-pay | Admitting: Obstetrics & Gynecology

## 2021-11-15 ENCOUNTER — Other Ambulatory Visit: Payer: Medicaid Other

## 2021-11-15 ENCOUNTER — Encounter: Payer: Self-pay | Admitting: Obstetrics & Gynecology

## 2021-11-15 VITALS — BP 136/85 | HR 96 | Wt 256.0 lb

## 2021-11-15 DIAGNOSIS — O0993 Supervision of high risk pregnancy, unspecified, third trimester: Secondary | ICD-10-CM

## 2021-11-15 DIAGNOSIS — O24319 Unspecified pre-existing diabetes mellitus in pregnancy, unspecified trimester: Secondary | ICD-10-CM | POA: Diagnosis not present

## 2021-11-15 DIAGNOSIS — Z3A33 33 weeks gestation of pregnancy: Secondary | ICD-10-CM

## 2021-11-15 LAB — POCT URINALYSIS DIPSTICK OB
Blood, UA: NEGATIVE
Leukocytes, UA: NEGATIVE
Nitrite, UA: NEGATIVE

## 2021-11-15 MED ORDER — INSULIN GLARGINE 100 UNIT/ML ~~LOC~~ SOLN: 35.0000 [IU] | Freq: Two times a day (BID) | SUBCUTANEOUS | 11 refills | Status: DC

## 2021-11-15 MED ORDER — HUMALOG KWIKPEN 100 UNIT/ML ~~LOC~~ SOPN: PEN_INJECTOR | SUBCUTANEOUS | 11 refills | Status: DC

## 2021-11-15 NOTE — Telephone Encounter (Signed)
Patient called to see if Eure could update her medicine he changed so she can go pick it up at the pharmacy. Please advise.

## 2021-11-15 NOTE — Progress Notes (Signed)
HIGH-RISK PREGNANCY VISIT Patient name: Claire Harrison MRN [id number]  Date of birth: 28-Jul-1996 Chief Complaint:   Routine Prenatal Visit (NST)  History of Present Illness:   Claire Harrison is a 26 y.o. [id number] female at [redacted]w[redacted]d with an Estimated Date of Delivery: 12/31/21 being seen today for ongoing management of a high-risk pregnancy complicated by Class B Diabetes, borderline BP levels.    Today she reports no complaints. Contractions: Irritability. Vag. Bleeding: None.  Movement: Present. denies leaking of fluid.   Depression screen [facility] 2/9 07/01/2021 05/17/2021 10/06/2018 10/13/2016 09/15/2016  Decreased Interest 1 1 3  0 0  Down, Depressed, Hopeless 1 0 3 1 2   PHQ - 2 Score 2 1 6 1 2   Altered sleeping 2 1 - 0 0  Tired, decreased energy 2 2 3  0 0  Change in appetite 1 0 0 0 0  Feeling bad or failure about yourself  0 0 2 0 0  Trouble concentrating 0 0 0 0 0  Moving slowly or fidgety/restless 0 0 0 0 0  Suicidal thoughts 0 0 0 0 0  PHQ-9 Score 7 4 - 1 2  Difficult doing work/chores - - Very difficult - -     GAD 7 : Generalized Anxiety Score 07/01/2021 05/17/2021  Nervous, Anxious, on Edge 2 1  Control/stop worrying 1 1  Worry too much - different things 1 0  Trouble relaxing 1 0  Restless 0 0  Easily annoyed or irritable 1 1  Afraid - awful might happen 1 0  Total GAD 7 Score 7 3     Review of Systems:   Pertinent items are noted in HPI Denies abnormal vaginal discharge w/ itching/odor/irritation, headaches, visual changes, shortness of breath, chest pain, abdominal pain, severe nausea/vomiting, or problems with urination or bowel movements unless otherwise stated above. Pertinent History Reviewed:  Reviewed past medical,surgical, social, obstetrical and family history.  Reviewed problem list, medications and allergies. Physical Assessment:   Vitals:   11/15/21 0910  BP: 136/85  Pulse: 96  Weight: 256 lb (116.1 kg)  Body mass index is 46.82 kg/m.            Physical Examination:   General appearance: alert, well appearing, and in no distress  Mental status: alert, oriented to person, place, and time  Skin: warm & dry   Extremities: Edema: Trace    Cardiovascular: normal heart rate noted  Respiratory: normal respiratory effort, no distress  Abdomen: gravid, soft, non-tender  Pelvic: Cervical exam deferred         Fetal Status:     Movement: Present    Fetal Surveillance Testing today: Reactive NST  Claire Harrison is at [redacted]w[redacted]d Estimated Date of Delivery: 12/31/21  NST being performed due to Class B DM  Today the NST is Reactive  Fetal Monitoring:  Baseline: 140s bpm, Variability: Good {> 6 bpm), Accelerations: Reactive, and Decelerations: Absent   reactive  The accelerations are >15 bpm and more than 2 in 20 minutes  Final diagnosis:  Reactive NST  11/17/21, MD     Chaperone: N/A    Results for orders placed or performed in visit on 11/15/21 (from the past 24 hour(s))  POC Urinalysis Dipstick OB   Collection Time: 11/15/21  9:16 AM  Result Value Ref Range   Color, UA     Clarity, UA     Glucose, UA Small (1+) (A) Negative   Bilirubin, UA     Ketones, UA trace  Spec Grav, UA     Blood, UA neg    pH, UA     POC,PROTEIN,UA Trace Negative, Trace, Small (1+), Moderate (2+), Large (3+), 4+   Urobilinogen, UA     Nitrite, UA neg    Leukocytes, UA Negative Negative   Appearance     Odor      Assessment & Plan:  High-risk pregnancy: [id number] at [redacted]w[redacted]d with an Estimated Date of Delivery: 12/31/21   1) class B diabetes, suboptimal control,  I am increasing Thomasita's insulin today I am splitting her Lantus up into 2 doses 35 units in the morning and 35 units in the evening total of 70 units which is a 10 unit increase from her 60 she was taking at night Additionally I am increasing her Humalog with meals from 14-18 I have also reinforced better dietary choices and she has made some fairly poor choices recently She is to  stay away from tropical fruits and any liquid carbs She is going to food combined with the peanut butter with her apples and stop eating honey nut Cheerios   2) borderline blood pressure, not treatable at this point and not diagnosable as gestational hypertension but definitely borderline and with her history we will certainly keep an eye on it, she is getting twice weekly surveillance nonetheless  Meds: No orders of the defined types were placed in this encounter.   Labs/procedures today: NST  Treatment Plan:  twice weekly surveillance  Reviewed: Preterm labor symptoms and general obstetric precautions including but not limited to vaginal bleeding, contractions, leaking of fluid and fetal movement were reviewed in detail with the patient.  All questions were answered. Does have home bp cuff. Office bp cuff given: not applicable. Check bp daily, let us know if consistently >150 and/or >95.  Follow-up: No follow-ups on file.   Future Appointments  Date Time Provider Department Center  11/19/2021  9:50 AM [facility] NURSE [facility]  11/22/2021  8:30 AM [facility] - [facility] [place] [facility] None  11/22/2021  9:30 AM Myna Hidalgo, DO [facility]  11/26/2021  9:30 AM [facility] NURSE [facility]  11/29/2021 10:30 AM Lazaro Arms, MD [facility]  12/03/2021  9:30 AM [facility] NURSE [facility]  12/06/2021  9:10 AM [facility] NURSE [facility]  12/06/2021  9:50 AM Lazaro Arms, MD [facility]  12/10/2021  9:50 AM [facility] NURSE [facility]  12/13/2021  9:50 AM Lazaro Arms, MD [facility]  12/17/2021  9:50 AM [facility] NURSE [facility]  12/20/2021  8:30 AM [facility] - [facility] [place] [facility] None  12/20/2021  9:50 AM Myna Hidalgo, DO [facility]  12/24/2021  9:30 AM [facility] NURSE [facility]  12/27/2021  9:50 AM Adis Sturgill, Amaryllis Dyke, MD [facility]    Orders Placed This Encounter  Procedures   POC Urinalysis Dipstick OB   Lazaro Arms  Attending Physician  for the [facility] 11/15/2021 10:13 AM

## 2021-11-15 NOTE — Telephone Encounter (Signed)
Called patient back. Dr. Despina Hidden has updated meds.

## 2021-11-15 NOTE — Telephone Encounter (Signed)
Patient called stating that she was returning a call back. Please contact pt

## 2021-11-15 NOTE — Addendum Note (Signed)
Addended by: Lazaro Arms on: 11/15/2021 11:14 AM   Modules accepted: Orders

## 2021-11-18 ENCOUNTER — Telehealth: Payer: Self-pay

## 2021-11-18 ENCOUNTER — Other Ambulatory Visit: Payer: Self-pay | Admitting: Obstetrics & Gynecology

## 2021-11-18 DIAGNOSIS — O0993 Supervision of high risk pregnancy, unspecified, third trimester: Secondary | ICD-10-CM

## 2021-11-18 MED ORDER — INSULIN GLARGINE 100 UNITS/ML SOLOSTAR PEN: 35.0000 [IU] | PEN_INJECTOR | Freq: Two times a day (BID) | SUBCUTANEOUS | 11 refills | Status: DC

## 2021-11-18 NOTE — Telephone Encounter (Signed)
Pt called and stated that the wrong insulin was called in.  She stated that she usually get the Lantus pen but the insulin vial was called in and she needs a months supply when it is called in.

## 2021-11-19 ENCOUNTER — Other Ambulatory Visit: Payer: Self-pay

## 2021-11-19 ENCOUNTER — Ambulatory Visit (INDEPENDENT_AMBULATORY_CARE_PROVIDER_SITE_OTHER): Payer: Medicaid Other | Admitting: *Deleted

## 2021-11-19 VITALS — BP 139/84 | HR 84 | Wt 256.3 lb

## 2021-11-19 DIAGNOSIS — E119 Type 2 diabetes mellitus without complications: Secondary | ICD-10-CM | POA: Diagnosis not present

## 2021-11-19 DIAGNOSIS — Z794 Long term (current) use of insulin: Secondary | ICD-10-CM

## 2021-11-19 DIAGNOSIS — Z331 Pregnant state, incidental: Secondary | ICD-10-CM

## 2021-11-19 DIAGNOSIS — O0993 Supervision of high risk pregnancy, unspecified, third trimester: Secondary | ICD-10-CM | POA: Diagnosis not present

## 2021-11-19 DIAGNOSIS — Z1389 Encounter for screening for other disorder: Secondary | ICD-10-CM

## 2021-11-19 DIAGNOSIS — O288 Other abnormal findings on antenatal screening of mother: Secondary | ICD-10-CM

## 2021-11-19 LAB — POCT URINALYSIS DIPSTICK OB
Blood, UA: NEGATIVE
Ketones, UA: NEGATIVE
Leukocytes, UA: NEGATIVE
Nitrite, UA: NEGATIVE

## 2021-11-19 NOTE — Progress Notes (Signed)
° °  NURSE VISIT- NST  SUBJECTIVE:  Claire Harrison is a 26 y.o. [phone] female at [redacted]w[redacted]d, here for a NST for pregnancy complicated by T2DM: Class B, currently on Insulin .  She reports active fetal movement, contractions: none, vaginal bleeding: none, membranes: Leaked one small area in underwear earlier this morning.   OBJECTIVE:  BP 139/84    Pulse 84    Wt 256 lb 4.8 oz (116.3 kg)    LMP 03/26/2021    BMI 46.88 kg/m   Appears well, no apparent distress  Results for orders placed or performed in visit on 11/19/21 (from the past 24 hour(s))  POC Urinalysis Dipstick OB   Collection Time: 11/19/21 10:14 AM  Result Value Ref Range   Color, UA     Clarity, UA     Glucose, UA Small (1+) (A) Negative   Bilirubin, UA     Ketones, UA neg    Spec Grav, UA     Blood, UA neg    pH, UA     POC,PROTEIN,UA Trace Negative, Trace, Small (1+), Moderate (2+), Large (3+), 4+   Urobilinogen, UA     Nitrite, UA neg    Leukocytes, UA Negative Negative   Appearance     Odor      NST: FHR baseline 150 bpm, Variability: moderate, Accelerations:present, Decelerations:  Absent= Cat 1/reactive Toco: none   ASSESSMENT: [id number] at [redacted]w[redacted]d with T2DM: Class B, currently on Lantus and Humalog NST reactive Spec exam by Dr Charlotta Newton. No leakage noted. Cervix closed  PLAN: EFM strip reviewed by Dr. Charlotta Newton   Recommendations: keep next appointment as scheduled    [name]  11/19/2021 10:48 AM

## 2021-11-21 ENCOUNTER — Inpatient Hospital Stay (HOSPITAL_BASED_OUTPATIENT_CLINIC_OR_DEPARTMENT_OTHER): Payer: Medicaid Other

## 2021-11-21 ENCOUNTER — Encounter (HOSPITAL_COMMUNITY): Payer: Self-pay | Admitting: Obstetrics & Gynecology

## 2021-11-21 ENCOUNTER — Telehealth: Payer: Self-pay | Admitting: *Deleted

## 2021-11-21 ENCOUNTER — Inpatient Hospital Stay (HOSPITAL_COMMUNITY)
Admission: AD | Admit: 2021-11-21 | Discharge: 2021-11-21 | Disposition: A | Discharge status: Home / Self Care | Payer: Medicaid Other | Attending: Obstetrics & Gynecology | Admitting: Obstetrics & Gynecology

## 2021-11-21 ENCOUNTER — Other Ambulatory Visit: Payer: Self-pay

## 2021-11-21 DIAGNOSIS — O26893 Other specified pregnancy related conditions, third trimester: Secondary | ICD-10-CM | POA: Insufficient documentation

## 2021-11-21 DIAGNOSIS — Z3689 Encounter for other specified antenatal screening: Secondary | ICD-10-CM | POA: Diagnosis not present

## 2021-11-21 DIAGNOSIS — O368130 Decreased fetal movements, third trimester, not applicable or unspecified: Secondary | ICD-10-CM | POA: Insufficient documentation

## 2021-11-21 DIAGNOSIS — Z3A34 34 weeks gestation of pregnancy: Secondary | ICD-10-CM | POA: Diagnosis not present

## 2021-11-21 DIAGNOSIS — M7989 Other specified soft tissue disorders: Secondary | ICD-10-CM | POA: Insufficient documentation

## 2021-11-21 DIAGNOSIS — O24113 Pre-existing diabetes mellitus, type 2, in pregnancy, third trimester: Secondary | ICD-10-CM | POA: Insufficient documentation

## 2021-11-21 DIAGNOSIS — R519 Headache, unspecified: Secondary | ICD-10-CM | POA: Diagnosis not present

## 2021-11-21 DIAGNOSIS — R030 Elevated blood-pressure reading, without diagnosis of hypertension: Secondary | ICD-10-CM | POA: Insufficient documentation

## 2021-11-21 DIAGNOSIS — E119 Type 2 diabetes mellitus without complications: Secondary | ICD-10-CM | POA: Diagnosis not present

## 2021-11-21 DIAGNOSIS — O09293 Supervision of pregnancy with other poor reproductive or obstetric history, third trimester: Secondary | ICD-10-CM | POA: Diagnosis not present

## 2021-11-21 LAB — COMPREHENSIVE METABOLIC PANEL
ALT: 12 U/L (ref 0–44)
AST: 14 U/L — ABNORMAL LOW (ref 15–41)
Albumin: 2.5 g/dL — ABNORMAL LOW (ref 3.5–5.0)
Alkaline Phosphatase: 117 U/L (ref 38–126)
Anion gap: 9 (ref 5–15)
BUN: 7 mg/dL (ref 6–20)
CO2: 22 mmol/L (ref 22–32)
Calcium: 8.8 mg/dL — ABNORMAL LOW (ref 8.9–10.3)
Chloride: 105 mmol/L (ref 98–111)
Creatinine, Ser: 0.49 mg/dL (ref 0.44–1.00)
GFR, Estimated: >60 mL/min (ref >60)
Glucose, Bld: 140 mg/dL — ABNORMAL HIGH (ref 70–99)
Potassium: 4 mmol/L (ref 3.5–5.1)
Sodium: 136 mmol/L (ref 135–145)
Total Bilirubin: 0.1 mg/dL — ABNORMAL LOW (ref 0.3–1.2)
Total Protein: 6.2 g/dL — ABNORMAL LOW (ref 6.5–8.1)

## 2021-11-21 LAB — URINALYSIS, ROUTINE W REFLEX MICROSCOPIC
Bilirubin Urine: NEGATIVE
Glucose, UA: NEGATIVE mg/dL
Hgb urine dipstick: NEGATIVE
Ketones, ur: NEGATIVE mg/dL
Leukocytes,Ua: NEGATIVE
Nitrite: NEGATIVE
Protein, ur: NEGATIVE mg/dL
Specific Gravity, Urine: 1.019 (ref 1.005–1.030)
pH: 6 (ref 5.0–8.0)

## 2021-11-21 LAB — PROTEIN / CREATININE RATIO, URINE
Creatinine, Urine: 127.74 mg/dL
Protein Creatinine Ratio: 0.23 mg/mg{Cre} — ABNORMAL HIGH (ref 0.00–0.15)
Total Protein, Urine: 30 mg/dL

## 2021-11-21 LAB — CBC
HCT: 36.5 % (ref 36.0–46.0)
Hemoglobin: 12.3 g/dL (ref 12.0–15.0)
MCH: 26.7 pg (ref 26.0–34.0)
MCHC: 33.7 g/dL (ref 30.0–36.0)
MCV: 79.3 fL — ABNORMAL LOW (ref 80.0–100.0)
Platelets: 363 10*3/uL (ref 150–400)
RBC: 4.6 MIL/uL (ref 3.87–5.11)
RDW: 13.9 % (ref 11.5–15.5)
WBC: 11.6 10*3/uL — ABNORMAL HIGH (ref 4.0–10.5)
nRBC: 0 % (ref 0.0–0.2)

## 2021-11-21 MED ORDER — LACTATED RINGERS IV BOLUS
1000.0000 mL | Freq: Once | INTRAVENOUS | Status: AC
  Administered 2021-11-21: 1000 mL via INTRAVENOUS

## 2021-11-21 MED ORDER — DIPHENHYDRAMINE HCL 50 MG/ML IJ SOLN
25.0000 mg | Freq: Once | INTRAMUSCULAR | Status: AC
  Administered 2021-11-21: 25 mg via INTRAVENOUS
  Filled 2021-11-21: qty 1

## 2021-11-21 MED ORDER — ACETAMINOPHEN 500 MG PO TABS
1000.0000 mg | ORAL_TABLET | Freq: Once | ORAL | Status: AC
  Administered 2021-11-21: 1000 mg via ORAL
  Filled 2021-11-21: qty 2

## 2021-11-21 MED ORDER — METOCLOPRAMIDE HCL 5 MG/ML IJ SOLN
10.0000 mg | Freq: Once | INTRAMUSCULAR | Status: AC
Start: 2021-11-21 — End: 2021-11-21
  Administered 2021-11-21: 10 mg via INTRAVENOUS
  Filled 2021-11-21: qty 2

## 2021-11-21 MED ORDER — CYCLOBENZAPRINE HCL 5 MG PO TABS
10.0000 mg | ORAL_TABLET | Freq: Once | ORAL | Status: AC
  Administered 2021-11-21: 10 mg via ORAL
  Filled 2021-11-21: qty 2

## 2021-11-21 NOTE — Telephone Encounter (Signed)
Patient called with complaints of elevated blood pressure, headache and blurred vision.  States her blood pressure when she woke up was 139/90. She developed a throbbing headache later in the morning so she retook her blood pressure and was 158/97.  She is also concerned that she has no had any fetal movement this morning.  Discussed with Dr Charlotta Newton and patient advised to go MAU for pre-e eval.   Pt verbalized understanding and agreeable to go.  Advised to keep appointment as scheduled for tomorrow unless something changes.

## 2021-11-21 NOTE — MAU Note (Signed)
...  Claire Harrison is a 26 y.o. at [redacted]w[redacted]d here in MAU reporting: DFM since 0300. Patient is also endorsing a HA, bilateral swelling of her legs and feet, and blurry vision. Also endorsing intermittent abdominal tightening since yesterday. Denies RUQ/epigastric pain. No VB or LOF.   0800 - 1 Fioricet  Pain score:  8/10 HA 5/10 lower abdomen  FHT: 140 initial external Lab orders placed from triage:  UA

## 2021-11-21 NOTE — MAU Provider Note (Signed)
History     [id number]  Arrival date and time: 11/21/21 1251    Chief Complaint  Patient presents with   Headache   Blurred Vision   Hypertension   Leg Swelling   Abdominal Pain     HPI Claire Harrison is a 26 y.o. at 71w2dwith PMHx notable for hx of preeclampsia & T2DM, who presents for headache, hypertension, and decreased fetal movement.  Reports headache since this morning. Rates pain 8/10. Took 1 fioricet this morning without relief. Reports spots in her vision since headache started. BP was elevated at home. Denies history of chronic hypertension but did have preeclampsia in previous pregnancies. No epigastric pain. Has noticed increase in lower extremity swelling this week.  Has felt baby move 2 times since this morning at 3 am.  Has felt some abdominal tightening. No LOF or vaginal bleeding.   OB History     Gravida  4   Para  2   Term  1   Preterm  1   AB  1   Living  2      SAB  1   IAB      Ectopic      Multiple      Live Births  2           Past Medical History:  Diagnosis Date   Anxiety    Asthma    Blood transfusion without reported diagnosis    Depression    Gestational diabetes    Pre-eclampsia     Past Surgical History:  Procedure Laterality Date   CESAREAN SECTION     2018, 2020   DILATION AND CURETTAGE OF UTERUS N/A 08/01/2016   Procedure: SUCTION DILATATION AND CURETTAGE;  Surgeon: JJonnie Kind MD;  Location: [facility];  Service: Gynecology;  Laterality: N/A;    Family History  Problem Relation Age of Onset   Diabetes Mother    Hypertension Mother    Kidney failure Mother    M87/ [place]Mother    Diabetes Father    Diabetes Sister    Miscarriages / [place]ister    Diabetes Sister    Miscarriages / Stillbirths Sister    Scoliosis Brother    Heart murmur Daughter    Bronchitis Son    Colon cancer Maternal Aunt    Diabetes Maternal Grandmother    Kidney failure Maternal Grandmother      Allergies  Allergen Reactions   Penicillins Anaphylaxis   Bee Venom Hives   Mango Flavor Hives and Swelling   Peanut-Containing Drug Products    Latex Rash    No current facility-administered medications on file prior to encounter.   Current Outpatient Medications on File Prior to Encounter  Medication Sig Dispense Refill   ACCU-CHEK GUIDE test strip USE 1 STRIP TO CHECK GLUCOSE 4 TIMES DAILY AS  INSTRUCTED 150 each 5   acetaminophen (TYLENOL) 325 MG tablet Take 650 mg by mouth every 6 (six) hours as needed.     aspirin 81 MG EC tablet Take 2 tablets (162 mg total) by mouth daily. Swallow whole. 180 tablet 2   ATROVENT HFA 17 MCG/ACT inhaler Inhale into the lungs. (Patient not taking: Reported on 11/19/2021)     Blood Glucose Monitoring Suppl (ACCU-CHEK GUIDE) w/Device KIT USE AS DIRECTED 1 kit 0   Blood Pressure Monitor MISC For regular home bp monitoring during pregnancy 1 each 0   butalbital-acetaminophen-caffeine (FIORICET) 50-325-40 MG tablet Take 1-2 tablets by mouth every 6 (  six) hours as needed for headache. (Patient not taking: Reported on 11/19/2021) 20 tablet 0   Doxylamine-Pyridoxine (DICLEGIS) 10-10 MG TBEC 2 tabs q hs, if sx persist add 1 tab q am on day 3, if sx persist add 1 tab q afternoon on day 4 (Patient not taking: Reported on 11/19/2021) 100 tablet 6   GLOBAL EASE INJECT PEN NEEDLES 32G X 4 MM MISC      HUMALOG KWIKPEN 100 UNIT/ML KwikPen 18 units with each meal 3 mL 11   insulin glargine (LANTUS) 100 unit/mL SOPN Inject 35 Units into the skin 2 (two) times daily. 15 mL 11   ondansetron (ZOFRAN ODT) 8 MG disintegrating tablet Take 1 tablet (8 mg total) by mouth every 8 (eight) hours as needed for nausea or vomiting. 20 tablet 0   Prenatal Vit-Fe Fumarate-FA (PRENATAL VITAMIN PO) Take by mouth daily.        ROS Pertinent positives and negative per HPI, all others reviewed and negative  Physical Exam   BP (!) 128/57 (BP Location: Left Arm)    Pulse 80    Temp  98 F (36.7 C) (Oral)    Resp 18    Ht 5' 2"  (1.575 m)    Wt 117.3 kg    LMP 03/26/2021    SpO2 98% Comment: room air   BMI 47.28 kg/m   Patient Vitals for the past 24 hrs:  BP Temp Temp src Pulse Resp SpO2 Height Weight  11/21/21 1638 (!) 128/57 -- -- 80 18 98 % -- --  11/21/21 1430 128/83 -- -- 88 -- -- -- --  11/21/21 1416 133/82 -- -- 92 -- -- -- --  11/21/21 1400 (!) 124/100 -- -- 82 -- -- -- --  11/21/21 1346 129/80 -- -- 84 -- -- -- --  11/21/21 1339 128/84 -- -- 94 -- -- -- --  11/21/21 1337 128/84 -- -- 92 -- -- -- --  11/21/21 1324 129/81 -- -- 88 20 98 % -- --  11/21/21 1306 136/85 98 F (36.7 C) Oral 88 19 100 % 5' 2"  (1.575 m) 117.3 kg    Physical Exam Vitals and nursing note reviewed.  Constitutional:      General: She is not in acute distress.    Appearance: She is well-developed.  Cardiovascular:     Rate and Rhythm: Normal rate and regular rhythm.     Heart sounds: Normal heart sounds.  Pulmonary:     Effort: Pulmonary effort is normal. No respiratory distress.  Abdominal:     Palpations: Abdomen is soft.  Musculoskeletal:        General: No tenderness.     Right lower leg: 1+ Pitting Edema present.     Left lower leg: 1+ Pitting Edema present.  Skin:    General: Skin is warm and dry.  Neurological:     Mental Status: She is alert.     Deep Tendon Reflexes:     Reflex Scores:      Patellar reflexes are 2+ on the right side and 2+ on the left side.    Comments: No clonus  Psychiatric:        Mood and Affect: Mood normal.        Behavior: Behavior normal.     FHT Baseline 145, moderate variability, 15x15 accels, no decels Toco: none Cat: 1  Labs Results for orders placed or performed during the hospital encounter of 11/21/21 (from the past 24 hour(s))  CBC  Status: Abnormal   Collection Time: 11/21/21  1:33 PM  Result Value Ref Range   WBC 11.6 (H) 4.0 - 10.5 K/uL   RBC 4.60 3.87 - 5.11 MIL/uL   Hemoglobin 12.3 12.0 - 15.0 g/dL   HCT 36.5  36.0 - 46.0 %   MCV 79.3 (L) 80.0 - 100.0 fL   MCH 26.7 26.0 - 34.0 pg   MCHC 33.7 30.0 - 36.0 g/dL   RDW 13.9 11.5 - 15.5 %   Platelets 363 150 - 400 K/uL   nRBC 0.0 0.0 - 0.2 %  Comprehensive metabolic panel     Status: Abnormal   Collection Time: 11/21/21  1:33 PM  Result Value Ref Range   Sodium 136 135 - 145 mmol/L   Potassium 4.0 3.5 - 5.1 mmol/L   Chloride 105 98 - 111 mmol/L   CO2 22 22 - 32 mmol/L   Glucose, Bld 140 (H) 70 - 99 mg/dL   BUN 7 6 - 20 mg/dL   Creatinine, Ser 0.49 0.44 - 1.00 mg/dL   Calcium 8.8 (L) 8.9 - 10.3 mg/dL   Total Protein 6.2 (L) 6.5 - 8.1 g/dL   Albumin 2.5 (L) 3.5 - 5.0 g/dL   AST 14 (L) 15 - 41 U/L   ALT 12 0 - 44 U/L   Alkaline Phosphatase 117 38 - 126 U/L   Total Bilirubin 0.1 (L) 0.3 - 1.2 mg/dL   GFR, Estimated >60 >60 mL/min   Anion gap 9 5 - 15  Urinalysis, Routine w reflex microscopic Urine, Clean Catch     Status: Abnormal   Collection Time: 11/21/21  1:37 PM  Result Value Ref Range   Color, Urine YELLOW YELLOW   APPearance HAZY (A) CLEAR   Specific Gravity, Urine 1.019 1.005 - 1.030   pH 6.0 5.0 - 8.0   Glucose, UA NEGATIVE NEGATIVE mg/dL   Hgb urine dipstick NEGATIVE NEGATIVE   Bilirubin Urine NEGATIVE NEGATIVE   Ketones, ur NEGATIVE NEGATIVE mg/dL   Protein, ur NEGATIVE NEGATIVE mg/dL   Nitrite NEGATIVE NEGATIVE   Leukocytes,Ua NEGATIVE NEGATIVE  Protein / creatinine ratio, urine     Status: Abnormal   Collection Time: 11/21/21  1:37 PM  Result Value Ref Range   Creatinine, Urine 127.74 mg/dL   Total Protein, Urine 30 mg/dL   Protein Creatinine Ratio 0.23 (H) 0.00 - 0.15 mg/mg[Cre]    Imaging [place] MFM FETAL BPP WO NON STRESS  Result Date: 11/21/2021 ----------------------------------------------------------------------  OBSTETRICS REPORT                       (Signed Final 11/21/2021 04:44 pm) ---------------------------------------------------------------------- Patient Info  ID #:       [id number]                           D.O.B.:  09-01-1996 (25 yrs)  Name:       Claire Harrison                Visit Date: 11/21/2021 02:24 pm ---------------------------------------------------------------------- Performed By  Attending:        Sander Nephew      Secondary Phy.:   Jorje Guild                    MD  CNM  Performed By:     Georgie Chard        Address:          [place]  Referred By:      [facility] MAU/Triage         [facility]                                                             [place] ---------------------------------------------------------------------- Orders  #  Description                           Code        Ordered By  1  [place] MFM FETAL BPP WO NON               43154.00    Vernecia Umble     STRESS ----------------------------------------------------------------------  #  Order #                     Accession #                Episode #  1  [id number]                   [id number]                 [id number] ---------------------------------------------------------------------- Indications  [redacted] weeks gestation of pregnancy                Z3A.34  Decreased fetal movements, third trimester,    O36.8130  unspecified ---------------------------------------------------------------------- Fetal Evaluation  Num Of Fetuses:         1  Fetal Heart Rate(bpm):  167  Cardiac Activity:       Observed  Presentation:           Cephalic  Placenta:               Anterior  P. Cord Insertion:      Visualized  Amniotic Fluid  AFI FV:      Within normal limits  AFI Sum(cm)     %Tile       Largest Pocket(cm)  14.3            51          6.3  RUQ(cm)       RLQ(cm)       LUQ(cm)        LLQ(cm)  6.3           4.3           1.8            1.9  Comment:    No placental abruption or previa identified. No subchorionic  hemorrhage seen.  ---------------------------------------------------------------------- Biophysical Evaluation  Amniotic F.V:   Within normal limits       F. Tone:        Observed  F. Movement:    Observed                   Score:          8/8  F. Breathing:   Observed ---------------------------------------------------------------------- OB History  Gravidity:    4         Term:   1        Prem:   1        SAB:   1  Living:       2 ---------------------------------------------------------------------- Gestational Age  LMP:           34w 2d        Date:  03/26/21                 EDD:   12/31/21  Best:          34w 2d     Det. By:  LMP  (03/26/21)          EDD:   12/31/21 ---------------------------------------------------------------------- Impression  Antenatal testing performed given decreased fetal movement.  The biophysical profile was 8/8 with good fetal movement and  amniotic fluid volume.  No evidence of placental abruption or previa. ---------------------------------------------------------------------- Recommendations  Clinical correlation recommended. ----------------------------------------------------------------------               Sander Nephew, MD Electronically Signed Final Report   11/21/2021 04:44 pm ----------------------------------------------------------------------   MAU Course  Procedures Lab Orders         Urinalysis, Routine w reflex microscopic Urine, Clean Catch         CBC         Comprehensive metabolic panel         Protein / creatinine ratio, urine    Meds ordered this encounter  Medications   acetaminophen (TYLENOL) tablet 1,000 mg   cyclobenzaprine (FLEXERIL) tablet 10 mg   lactated ringers bolus 1,000 mL   metoCLOPramide (REGLAN) injection 10 mg   diphenhydrAMINE (BENADRYL) injection 25 mg   Imaging Orders         [place] MFM FETAL BPP WO NON STRESS      MDM Preeclampsia labs ordered & reviewed.  Ultrasound ordered do to decreased fetal movement Elevated BP x 1 in MAU - pt  has follow up with her OB tomorrow Headache treated in MAU with oral meds & IV fluids/meds Assessment and Plan   1. Headache in pregnancy, antepartum, third trimester  -resolved after IV fluids, reglan, & benadryl  2. [redacted] weeks gestation of pregnancy   3. Decreased fetal movement, third trimester, not applicable or unspecified fetus  -fetal tracing reactive & patient documented 8 movements while being monitored -BPP 8/8  4. NST (non-stress test) reactive   5. Elevated BP without diagnosis of hypertension  -Reports elevated BP at home. Mostly normotensive in MAU except for 1 elevated DBP. Preeclampsia labs negative & headache resolved. Patient will f/u with her OB tomorrow     Jorje Guild, NP 11/21/21 5:44 PM

## 2021-11-22 ENCOUNTER — Encounter: Payer: Self-pay | Admitting: Obstetrics & Gynecology

## 2021-11-22 ENCOUNTER — Other Ambulatory Visit (INDEPENDENT_AMBULATORY_CARE_PROVIDER_SITE_OTHER): Payer: Medicaid Other

## 2021-11-22 ENCOUNTER — Ambulatory Visit (INDEPENDENT_AMBULATORY_CARE_PROVIDER_SITE_OTHER): Payer: Medicaid Other | Admitting: Obstetrics & Gynecology

## 2021-11-22 ENCOUNTER — Other Ambulatory Visit: Payer: Self-pay | Admitting: Obstetrics & Gynecology

## 2021-11-22 VITALS — BP 126/78 | HR 85 | Wt 261.0 lb

## 2021-11-22 DIAGNOSIS — Z3A34 34 weeks gestation of pregnancy: Secondary | ICD-10-CM

## 2021-11-22 DIAGNOSIS — O24113 Pre-existing diabetes mellitus, type 2, in pregnancy, third trimester: Secondary | ICD-10-CM | POA: Diagnosis not present

## 2021-11-22 DIAGNOSIS — O0993 Supervision of high risk pregnancy, unspecified, third trimester: Secondary | ICD-10-CM

## 2021-11-22 LAB — POCT URINALYSIS DIPSTICK OB
Blood, UA: NEGATIVE
Glucose, UA: NEGATIVE
Ketones, UA: NEGATIVE
Leukocytes, UA: NEGATIVE
Nitrite, UA: NEGATIVE
POC,PROTEIN,UA: NEGATIVE

## 2021-11-22 NOTE — Progress Notes (Signed)
[place] [id number] wks,cephalic,BPP [id number] placenta gr 2,AFI 15 cm,fhr 146 bpm,EFW 3106 g 97%,AC 99%

## 2021-11-22 NOTE — Progress Notes (Signed)
HIGH-RISK PREGNANCY VISIT Patient name: Claire Harrison MRN [id number]  Date of birth: 07-23-1996 Chief Complaint:   Routine Prenatal Visit  History of Present Illness:   Claire Harrison is a 26 y.o. [id number] female at 33w3dwith an Estimated Date of Delivery: 12/31/21 being seen today for ongoing management of a high-risk pregnancy complicated by:  -Class B DM on Lantus 35 am/pm, Humalog 18 with meals.   Much improved, elevated evenings  Today she reports backache.   Seen yesterday in MAU due to concern for preeclampsia- BP within normal limits.  BPP 8/8  Contractions: Irritability. Vag. Bleeding: None.  Movement: Present. denies leaking of fluid.   [facility] 07[facility]21 05/17/2021 10/06/2018 10/13/2016 09/15/2016  Decreased Interest _0 0 0  Down, Depressed, Hopeless 1 0 _1 PHQ - 2 Score _2 Altered sleeping 2 1 - 0 0  Tired, decreased energy _3 0 0  Change in appetite 1 0 0 0 0  Feeling bad or failure about yourself  0 0 2 0 0  Trouble concentrating 0 0 0 0 0  Moving slowly or fidgety/restless 0 0 0 0 0  Suicidal thoughts 0 0 0 0 0  PHQ-9 Score 7 4 - 1 2  Difficult doing work/chores - - Very difficult - -     Current Outpatient Medications  Medication Instructions   ACCU-CHEK GUIDE test strip USE 1 STRIP TO CHECK GLUCOSE 4 TIMES DAILY AS  INSTRUCTED   acetaminophen (TYLENOL) 650 mg, Oral, Every 6 hours PRN   aspirin 162 mg, Oral, Daily, Swallow whole.   ATROVENT HFA 17 MCG/ACT inhaler Inhale into the lungs.   Blood Glucose Monitoring Suppl (ACCU-CHEK GUIDE) w/Device KIT USE AS DIRECTED   Blood Pressure Monitor MISC For regular home bp monitoring during pregnancy   butalbital-acetaminophen-caffeine (FIORICET) 50-325-40 MG tablet 1-2 tablets, Oral, Every 6 hours PRN   Doxylamine-Pyridoxine (DICLEGIS) 10-10 MG TBEC 2 tabs q hs, if sx persist add 1 tab q am on day 3, if sx persist add 1 tab q afternoon on day 4   GLOBAL EASE INJECT PEN NEEDLES 32G X  4 MM MISC No dose, route, or frequency recorded.   HUMALOG KWIKPEN 100 UNIT/ML KwikPen 18 units with each meal   insulin glargine (LANTUS) 35 Units, Subcutaneous, 2 times daily   ondansetron (ZOFRAN ODT) 8 mg, Oral, Every 8 hours PRN   Prenatal Vit-Fe Fumarate-FA (PRENATAL VITAMIN PO) Oral, Daily     Review of Systems:   Pertinent items are noted in HPI Denies abnormal vaginal discharge w/ itching/odor/irritation, headaches, visual changes, shortness of breath, chest pain, abdominal pain, severe nausea/vomiting, or problems with urination or bowel movements unless otherwise stated above. Pertinent History Reviewed:  Reviewed past medical,surgical, social, obstetrical and family history.  Reviewed problem list, medications and allergies. Physical Assessment:   Vitals:   11/22/21 0920  BP: 126/78  Pulse: 85  Weight: 261 lb (118.4 kg)  Body mass index is 47.74 kg/m.           Physical Examination:   General appearance: alert, well appearing, and in no distress  Mental status: normal mood, behavior, speech, dress, motor activity, and thought processes  Skin: warm & dry   Extremities: Edema: Trace    Cardiovascular: normal heart rate noted  Respiratory: normal respiratory effort, no distress  Abdomen: gravid, soft, non-tender  Pelvic: Cervical exam deferred         Fetal  Status:     Movement: Present    Fetal Surveillance Testing today: BPP/Growth cephalic,BPP 5/4,SFKCLEXN placenta gr 2,AFI 15 cm,fhr 146 bpm,EFW 3106 g 97%,AC 99%   Chaperone: N/A    Results for orders placed or performed in visit on 11/22/21 (from the past 24 hour(s))  POC Urinalysis Dipstick OB   Collection Time: 11/22/21  9:19 AM  Result Value Ref Range   Color, UA     Clarity, UA     Glucose, UA Negative Negative   Bilirubin, UA     Ketones, UA neg    Spec Grav, UA     Blood, UA neg    pH, UA     POC,PROTEIN,UA Negative Negative, Trace, Small (1+), Moderate (2+), Large (3+), 4+   Urobilinogen, UA      Nitrite, UA neg    Leukocytes, UA Negative Negative   Appearance     Odor    Results for orders placed or performed during the hospital encounter of 11/21/21 (from the past 24 hour(s))  CBC   Collection Time: 11/21/21  1:33 PM  Result Value Ref Range   WBC 11.6 (H) 4.0 - 10.5 K/uL   RBC 4.60 3.87 - 5.11 MIL/uL   Hemoglobin 12.3 12.0 - 15.0 g/dL   HCT 36.5 36.0 - 46.0 %   MCV 79.3 (L) 80.0 - 100.0 fL   MCH 26.7 26.0 - 34.0 pg   MCHC 33.7 30.0 - 36.0 g/dL   RDW 13.9 11.5 - 15.5 %   Platelets 363 150 - 400 K/uL   nRBC 0.0 0.0 - 0.2 %  Comprehensive metabolic panel   Collection Time: 11/21/21  1:33 PM  Result Value Ref Range   Sodium 136 135 - 145 mmol/L   Potassium 4.0 3.5 - 5.1 mmol/L   Chloride 105 98 - 111 mmol/L   CO2 22 22 - 32 mmol/L   Glucose, Bld 140 (H) 70 - 99 mg/dL   BUN 7 6 - 20 mg/dL   Creatinine, Ser 0.49 0.44 - 1.00 mg/dL   Calcium 8.8 (L) 8.9 - 10.3 mg/dL   Total Protein 6.2 (L) 6.5 - 8.1 g/dL   Albumin 2.5 (L) 3.5 - 5.0 g/dL   AST 14 (L) 15 - 41 U/L   ALT 12 0 - 44 U/L   Alkaline Phosphatase 117 38 - 126 U/L   Total Bilirubin 0.1 (L) 0.3 - 1.2 mg/dL   GFR, Estimated >60 >60 mL/min   Anion gap 9 5 - 15  Urinalysis, Routine w reflex microscopic Urine, Clean Catch   Collection Time: 11/21/21  1:37 PM  Result Value Ref Range   Color, Urine YELLOW YELLOW   APPearance HAZY (A) CLEAR   Specific Gravity, Urine 1.019 1.005 - 1.030   pH 6.0 5.0 - 8.0   Glucose, UA NEGATIVE NEGATIVE mg/dL   Hgb urine dipstick NEGATIVE NEGATIVE   Bilirubin Urine NEGATIVE NEGATIVE   Ketones, ur NEGATIVE NEGATIVE mg/dL   Protein, ur NEGATIVE NEGATIVE mg/dL   Nitrite NEGATIVE NEGATIVE   Leukocytes,Ua NEGATIVE NEGATIVE  Protein / creatinine ratio, urine   Collection Time: 11/21/21  1:37 PM  Result Value Ref Range   Creatinine, Urine 127.74 mg/dL   Total Protein, Urine 30 mg/dL   Protein Creatinine Ratio 0.23 (H) 0.00 - 0.15 mg/mg[Cre]     Assessment & Plan:  High-risk  pregnancy: [id number] at 25w3dwith an Estimated Date of Delivery: 12/31/21   1) T2DM -continue Lantus 35 bid -continue Humalog 18 with breakfast/lunch,  increase 20-22u with dinner -continue twice weekly testing  2) Prior C-section x 2 -discussed goal of 38-39wks _0  may consider scheduling at next visit pending maternal/fetal status.  She is aware that timing may change  Meds: No orders of the defined types were placed in this encounter.   Labs/procedures today: BPP/Growth  Treatment Plan:  as outlined above  Reviewed: Preterm labor symptoms and general obstetric precautions including but not limited to vaginal bleeding, contractions, leaking of fluid and fetal movement were reviewed in detail with the patient.  All questions were answered. Pt has home bp cuff. Check bp weekly, let us know if >140/90.   Follow-up: Return for HROB visit- twice weekly as scheduled.   Future Appointments  Date Time Provider [place]  11/22/2021 10:10 AM Janyth Pupa, DO [facility]  11/26/2021  9:30 AM [facility] NURSE [facility]  11/29/2021 10:30 AM Florian Buff, MD [facility]  12/03/2021  9:30 AM [facility] NURSE [facility]  12/06/2021  9:10 AM [facility] NURSE [facility]  12/06/2021  9:50 AM Florian Buff, MD [facility]  12/10/2021  9:50 AM [facility] NURSE [facility]  12/13/2021  9:50 AM Florian Buff, MD [facility]  12/17/2021  9:50 AM [facility] NURSE [facility]  12/20/2021  8:30 AM [facility] - [facility] [place] [facility] None  12/20/2021  9:50 AM Janyth Pupa, DO [facility]  12/24/2021  9:30 AM [facility] NURSE [facility]  12/27/2021  9:50 AM Eure, Mertie Clause, MD [facility]    Orders Placed This Encounter  Procedures   POC Urinalysis Dipstick OB    Janyth Pupa, DO Attending [place], [place] for [employer] [employer]

## 2021-11-26 ENCOUNTER — Ambulatory Visit (INDEPENDENT_AMBULATORY_CARE_PROVIDER_SITE_OTHER): Payer: Medicaid Other | Admitting: *Deleted

## 2021-11-26 ENCOUNTER — Other Ambulatory Visit: Payer: Self-pay

## 2021-11-26 VITALS — BP 131/84 | HR 94 | Wt 261.8 lb

## 2021-11-26 DIAGNOSIS — Z1389 Encounter for screening for other disorder: Secondary | ICD-10-CM

## 2021-11-26 DIAGNOSIS — O288 Other abnormal findings on antenatal screening of mother: Secondary | ICD-10-CM

## 2021-11-26 DIAGNOSIS — O09299 Supervision of pregnancy with other poor reproductive or obstetric history, unspecified trimester: Secondary | ICD-10-CM

## 2021-11-26 DIAGNOSIS — Z3A35 35 weeks gestation of pregnancy: Secondary | ICD-10-CM

## 2021-11-26 DIAGNOSIS — Z794 Long term (current) use of insulin: Secondary | ICD-10-CM | POA: Diagnosis not present

## 2021-11-26 DIAGNOSIS — O0993 Supervision of high risk pregnancy, unspecified, third trimester: Secondary | ICD-10-CM

## 2021-11-26 DIAGNOSIS — E119 Type 2 diabetes mellitus without complications: Secondary | ICD-10-CM | POA: Diagnosis not present

## 2021-11-26 DIAGNOSIS — Z331 Pregnant state, incidental: Secondary | ICD-10-CM

## 2021-11-26 LAB — POCT URINALYSIS DIPSTICK OB
Blood, UA: NEGATIVE
Glucose, UA: NEGATIVE
Ketones, UA: NEGATIVE
Leukocytes, UA: NEGATIVE
Nitrite, UA: NEGATIVE

## 2021-11-26 NOTE — Progress Notes (Cosign Needed)
° °  NURSE VISIT- NST  SUBJECTIVE:  Claire Harrison is a 26 y.o. [phone] female at [redacted]w[redacted]d, here for a NST for pregnancy complicated by T2DM: Class B, currently on Lantus and humalog .  She reports active fetal movement, contractions: irritability,vaginal bleeding: none, membranes: intact.   OBJECTIVE:  BP 131/84    Pulse 94    Wt 261 lb 12.8 oz (118.8 kg)    LMP 03/26/2021    BMI 47.88 kg/m   Appears well, no apparent distress  Results for orders placed or performed in visit on 11/26/21 (from the past 24 hour(s))  POC Urinalysis Dipstick OB   Collection Time: 11/26/21 10:18 AM  Result Value Ref Range   Color, UA     Clarity, UA     Glucose, UA Negative Negative   Bilirubin, UA     Ketones, UA neg    Spec Grav, UA     Blood, UA neg    pH, UA     POC,PROTEIN,UA Trace Negative, Trace, Small (1+), Moderate (2+), Large (3+), 4+   Urobilinogen, UA     Nitrite, UA neg    Leukocytes, UA Negative Negative   Appearance     Odor      NST: FHR baseline 145 bpm, Variability: moderate, Accelerations:present, Decelerations:  Absent= Cat 1/reactive Toco: none   ASSESSMENT: [id number] at [redacted]w[redacted]d with T2DM: Class B, currently on Lantus and humalog NST reactive  PLAN: EFM strip reviewed by Dr. Despina Hidden   Recommendations: keep next appointment as scheduled    [name]  11/26/2021 12:22 PM

## 2021-11-27 LAB — COMPREHENSIVE METABOLIC PANEL
ALT: 13 IU/L (ref 0–32)
AST: 11 IU/L (ref 0–40)
Albumin/Globulin Ratio: 1.2 (ref 1.2–2.2)
Albumin: 3.4 g/dL — ABNORMAL LOW (ref 3.9–5.0)
Alkaline Phosphatase: 144 IU/L — ABNORMAL HIGH (ref 44–121)
BUN/Creatinine Ratio: 17 (ref 9–23)
BUN: 9 mg/dL (ref 6–20)
Bilirubin Total: <0.2 mg/dL (ref 0.0–1.2)
CO2: 19 mmol/L — ABNORMAL LOW (ref 20–29)
Calcium: 9 mg/dL (ref 8.7–10.2)
Chloride: 105 mmol/L (ref 96–106)
Creatinine, Ser: 0.54 mg/dL — ABNORMAL LOW (ref 0.57–1.00)
Globulin, Total: 2.8 g/dL (ref 1.5–4.5)
Glucose: 135 mg/dL — ABNORMAL HIGH (ref 70–99)
Potassium: 4.4 mmol/L (ref 3.5–5.2)
Sodium: 137 mmol/L (ref 134–144)
Total Protein: 6.2 g/dL (ref 6.0–8.5)
eGFR: 129 mL/min/{1.73_m2} (ref >59)

## 2021-11-29 ENCOUNTER — Ambulatory Visit (INDEPENDENT_AMBULATORY_CARE_PROVIDER_SITE_OTHER): Payer: Medicaid Other | Admitting: Obstetrics & Gynecology

## 2021-11-29 ENCOUNTER — Other Ambulatory Visit: Payer: Self-pay

## 2021-11-29 ENCOUNTER — Encounter: Payer: Self-pay | Admitting: Obstetrics & Gynecology

## 2021-11-29 VITALS — BP 116/77 | HR 75 | Wt 260.6 lb

## 2021-11-29 DIAGNOSIS — E119 Type 2 diabetes mellitus without complications: Secondary | ICD-10-CM

## 2021-11-29 DIAGNOSIS — Z1389 Encounter for screening for other disorder: Secondary | ICD-10-CM

## 2021-11-29 DIAGNOSIS — Z794 Long term (current) use of insulin: Secondary | ICD-10-CM

## 2021-11-29 DIAGNOSIS — Z331 Pregnant state, incidental: Secondary | ICD-10-CM

## 2021-11-29 DIAGNOSIS — O0993 Supervision of high risk pregnancy, unspecified, third trimester: Secondary | ICD-10-CM

## 2021-11-29 LAB — POCT URINALYSIS DIPSTICK OB
Blood, UA: NEGATIVE
Glucose, UA: NEGATIVE
Leukocytes, UA: NEGATIVE
Nitrite, UA: NEGATIVE

## 2021-11-29 MED ORDER — INSULIN GLARGINE 100 UNITS/ML SOLOSTAR PEN
35.0000 [IU] | PEN_INJECTOR | Freq: Two times a day (BID) | SUBCUTANEOUS | 11 refills | Status: DC
Start: 2021-11-29 — End: 2021-12-12

## 2021-11-29 MED ORDER — ACCU-CHEK SOFTCLIX LANCETS MISC: 100.0000 | Freq: Four times a day (QID) | 12 refills | Status: DC

## 2021-11-29 NOTE — Progress Notes (Signed)
HIGH-RISK PREGNANCY VISIT Patient name: Claire Harrison MRN [id number]  Date of birth: 1996/04/21 Chief Complaint:   High Risk Gestation and Non-stress Test  History of Present Illness:   Claire Harrison is a 26 y.o. [id number] female at [redacted]w[redacted]d with an Estimated Date of Delivery: 12/31/21 being seen today for ongoing management of a high-risk pregnancy complicated by Class B DM.    Today she reports no complaints. Contractions: Irritability.  .  Movement: Present. denies leaking of fluid.   Depression screen [facility] 2/9 07/01/2021 05/17/2021 10/06/2018 10/13/2016 09/15/2016  Decreased Interest 1 1 3  0 0  Down, Depressed, Hopeless 1 0 3 1 2   PHQ - 2 Score 2 1 6 1 2   Altered sleeping 2 1 - 0 0  Tired, decreased energy 2 2 3  0 0  Change in appetite 1 0 0 0 0  Feeling bad or failure about yourself  0 0 2 0 0  Trouble concentrating 0 0 0 0 0  Moving slowly or fidgety/restless 0 0 0 0 0  Suicidal thoughts 0 0 0 0 0  PHQ-9 Score 7 4 - 1 2  Difficult doing work/chores - - Very difficult - -     GAD 7 : Generalized Anxiety Score 07/01/2021 05/17/2021  Nervous, Anxious, on Edge 2 1  Control/stop worrying 1 1  Worry too much - different things 1 0  Trouble relaxing 1 0  Restless 0 0  Easily annoyed or irritable 1 1  Afraid - awful might happen 1 0  Total GAD 7 Score 7 3     Review of Systems:   Pertinent items are noted in HPI Denies abnormal vaginal discharge w/ itching/odor/irritation, headaches, visual changes, shortness of breath, chest pain, abdominal pain, severe nausea/vomiting, or problems with urination or bowel movements unless otherwise stated above. Pertinent History Reviewed:  Reviewed past medical,surgical, social, obstetrical and family history.  Reviewed problem list, medications and allergies. Physical Assessment:   Vitals:   11/29/21 1050  BP: 116/77  Pulse: 75  Weight: 260 lb 9.6 oz (118.2 kg)  Body mass index is 47.66 kg/m.           Physical Examination:   General  appearance: alert, well appearing, and in no distress  Mental status: alert, oriented to person, place, and time  Skin: warm & dry   Extremities: Edema: Trace    Cardiovascular: normal heart rate noted  Respiratory: normal respiratory effort, no distress  Abdomen: gravid, soft, non-tender  Pelvic: Cervical exam deferred         Fetal Status:     Movement: Present    Fetal Surveillance Testing today: Reactive NST   Chaperone: N/A    Results for orders placed or performed in visit on 11/29/21 (from the past 24 hour(s))  POC Urinalysis Dipstick OB   Collection Time: 11/29/21 10:56 AM  Result Value Ref Range   Color, UA     Clarity, UA     Glucose, UA Negative Negative   Bilirubin, UA     Ketones, UA small    Spec Grav, UA     Blood, UA neg    pH, UA     POC,PROTEIN,UA Small (1+) Negative, Trace, Small (1+), Moderate (2+), Large (3+), 4+   Urobilinogen, UA     Nitrite, UA neg    Leukocytes, UA Negative Negative   Appearance     Odor      Assessment & Plan:  High-risk pregnancy: [id number] at [redacted]w[redacted]d with an Estimated  Date of Delivery: 12/31/21    ICD-10-CM   1. Supervision of high risk pregnancy in third trimester  O09.93     2. Pregnant state, incidental  Z33.1 POC Urinalysis Dipstick OB    3. Screening for genitourinary condition  Z13.89 POC Urinalysis Dipstick OB    4. Type 2 diabetes mellitus without complication, with long-term current use of insulin (HCC)  E11.9    Z79.4         Meds:  Meds ordered this encounter  Medications   Accu-Chek Softclix Lancets lancets    Sig: 100 each by Other route 4 (four) times daily. Use as instructed    Dispense:  100 each    Refill:  12   insulin glargine (LANTUS) 100 unit/mL SOPN    Sig: Inject 35 Units into the skin 2 (two) times daily.    Dispense:  15 mL    Refill:  11    Labs/procedures today: NST  Treatment Plan:  twice weekly surveillance deliver 39 weeks or as clinically indicated   Follow-up: No follow-ups on  file.   Future Appointments  Date Time Provider [place]  12/03/2021  9:30 AM [facility] NURSE [facility]  12/06/2021  9:10 AM [facility] NURSE [facility]  12/06/2021  9:50 AM Florian Buff, MD [facility]  12/10/2021  9:50 AM [facility] NURSE [facility]  12/13/2021  9:50 AM Florian Buff, MD [facility]  12/17/2021  9:50 AM [facility] NURSE [facility]  12/20/2021  8:30 AM [place] - [facility] [place] [facility] None  12/20/2021  9:50 AM Janyth Pupa, DO [facility]  12/24/2021  9:30 AM [facility] NURSE [facility]  12/27/2021  9:50 AM Cobi Aldape, Claire Clause, MD [facility]    Orders Placed This Encounter  Procedures   POC Urinalysis Dipstick OB   Claire Harrison Claire Harrison  11/29/2021 11:28 AM

## 2021-11-30 ENCOUNTER — Inpatient Hospital Stay (HOSPITAL_COMMUNITY)
Admission: AD | Admit: 2021-11-30 | Discharge: 2021-12-01 | Disposition: A | Discharge status: Home / Self Care | Payer: Medicaid Other | Attending: Obstetrics and Gynecology | Admitting: Obstetrics and Gynecology

## 2021-11-30 ENCOUNTER — Encounter (HOSPITAL_COMMUNITY): Payer: Self-pay

## 2021-11-30 ENCOUNTER — Other Ambulatory Visit: Payer: Self-pay

## 2021-11-30 DIAGNOSIS — O368130 Decreased fetal movements, third trimester, not applicable or unspecified: Secondary | ICD-10-CM | POA: Diagnosis not present

## 2021-11-30 DIAGNOSIS — Z3A35 35 weeks gestation of pregnancy: Secondary | ICD-10-CM

## 2021-11-30 DIAGNOSIS — R109 Unspecified abdominal pain: Secondary | ICD-10-CM | POA: Insufficient documentation

## 2021-11-30 DIAGNOSIS — O09293 Supervision of pregnancy with other poor reproductive or obstetric history, third trimester: Secondary | ICD-10-CM | POA: Insufficient documentation

## 2021-11-30 DIAGNOSIS — Z0371 Encounter for suspected problem with amniotic cavity and membrane ruled out: Secondary | ICD-10-CM | POA: Diagnosis not present

## 2021-11-30 DIAGNOSIS — Z20822 Contact with and (suspected) exposure to covid-19: Secondary | ICD-10-CM | POA: Diagnosis not present

## 2021-11-30 DIAGNOSIS — O26893 Other specified pregnancy related conditions, third trimester: Secondary | ICD-10-CM

## 2021-11-30 DIAGNOSIS — R519 Headache, unspecified: Secondary | ICD-10-CM | POA: Diagnosis not present

## 2021-11-30 LAB — CBC WITH DIFFERENTIAL/PLATELET
Abs Immature Granulocytes: 0.13 10*3/uL — ABNORMAL HIGH (ref 0.00–0.07)
Basophils Absolute: 0 10*3/uL (ref 0.0–0.1)
Basophils Relative: 0 %
Eosinophils Absolute: 0.1 10*3/uL (ref 0.0–0.5)
Eosinophils Relative: 1 %
HCT: 34 % — ABNORMAL LOW (ref 36.0–46.0)
Hemoglobin: 10.8 g/dL — ABNORMAL LOW (ref 12.0–15.0)
Immature Granulocytes: 2 %
Lymphocytes Relative: 22 %
Lymphs Abs: 1.8 10*3/uL (ref 0.7–4.0)
MCH: 25.5 pg — ABNORMAL LOW (ref 26.0–34.0)
MCHC: 31.8 g/dL (ref 30.0–36.0)
MCV: 80.4 fL (ref 80.0–100.0)
Monocytes Absolute: 0.6 10*3/uL (ref 0.1–1.0)
Monocytes Relative: 7 %
Neutro Abs: 5.4 10*3/uL (ref 1.7–7.7)
Neutrophils Relative %: 68 %
Platelets: 326 10*3/uL (ref 150–400)
RBC: 4.23 MIL/uL (ref 3.87–5.11)
RDW: 13.9 % (ref 11.5–15.5)
WBC: 8 10*3/uL (ref 4.0–10.5)
nRBC: 0 % (ref 0.0–0.2)

## 2021-11-30 LAB — COMPREHENSIVE METABOLIC PANEL
ALT: 28 U/L (ref 0–44)
AST: 27 U/L (ref 15–41)
Albumin: 2.3 g/dL — ABNORMAL LOW (ref 3.5–5.0)
Alkaline Phosphatase: 119 U/L (ref 38–126)
Anion gap: 7 (ref 5–15)
BUN: 8 mg/dL (ref 6–20)
CO2: 19 mmol/L — ABNORMAL LOW (ref 22–32)
Calcium: 8.5 mg/dL — ABNORMAL LOW (ref 8.9–10.3)
Chloride: 110 mmol/L (ref 98–111)
Creatinine, Ser: 0.45 mg/dL (ref 0.44–1.00)
GFR, Estimated: >60 mL/min (ref >60)
Glucose, Bld: 112 mg/dL — ABNORMAL HIGH (ref 70–99)
Potassium: 3.5 mmol/L (ref 3.5–5.1)
Sodium: 136 mmol/L (ref 135–145)
Total Bilirubin: 0.2 mg/dL — ABNORMAL LOW (ref 0.3–1.2)
Total Protein: 6.3 g/dL — ABNORMAL LOW (ref 6.5–8.1)

## 2021-11-30 LAB — URINALYSIS, ROUTINE W REFLEX MICROSCOPIC
Bilirubin Urine: NEGATIVE
Glucose, UA: NEGATIVE mg/dL
Hgb urine dipstick: NEGATIVE
Ketones, ur: NEGATIVE mg/dL
Leukocytes,Ua: NEGATIVE
Nitrite: NEGATIVE
Protein, ur: NEGATIVE mg/dL
Specific Gravity, Urine: 1.011 (ref 1.005–1.030)
pH: 6 (ref 5.0–8.0)

## 2021-11-30 LAB — PROTEIN / CREATININE RATIO, URINE
Creatinine, Urine: 62.79 mg/dL
Protein Creatinine Ratio: 0.3 mg/mg{Cre} — ABNORMAL HIGH (ref 0.00–0.15)
Total Protein, Urine: 19 mg/dL

## 2021-11-30 LAB — RESP PANEL BY RT-PCR (FLU A&B, COVID) ARPGX2
Influenza A by PCR: NEGATIVE
Influenza B by PCR: NEGATIVE
SARS Coronavirus 2 by RT PCR: NEGATIVE

## 2021-11-30 MED ORDER — ACETAMINOPHEN 325 MG PO TABS
650.0000 mg | ORAL_TABLET | Freq: Once | ORAL | Status: AC
  Administered 2021-11-30: 650 mg via ORAL
  Filled 2021-11-30: qty 2

## 2021-11-30 MED ORDER — CYCLOBENZAPRINE HCL 5 MG PO TABS
10.0000 mg | ORAL_TABLET | Freq: Once | ORAL | Status: AC
  Administered 2021-11-30: 10 mg via ORAL
  Filled 2021-11-30: qty 2

## 2021-11-30 MED ORDER — LACTATED RINGERS IV BOLUS
1000.0000 mL | Freq: Once | INTRAVENOUS | Status: AC
  Administered 2021-11-30: 1000 mL via INTRAVENOUS

## 2021-11-30 MED ORDER — METOCLOPRAMIDE HCL 10 MG PO TABS
10.0000 mg | ORAL_TABLET | Freq: Once | ORAL | Status: AC
  Administered 2021-11-30: 10 mg via ORAL
  Filled 2021-11-30: qty 1

## 2021-11-30 NOTE — Progress Notes (Signed)
Monitors removed for transport to MAU at 2208. FHR strip reactive with no contractions noted.  ?

## 2021-11-30 NOTE — MAU Note (Addendum)
Pt transported to MAU via EMS from [facility] for PROM. Pt reports leaking around 0100 as she was putting her clothes on to go to bed. Pt was not sure if she peed but it did not look the same and increase frequency and leaking. PT reports DFM since yesterday and until she got to the hospital. States after the IV fluid it helped her feel baby move more just not the same as usual. Pt states has a slight HA and blurry visions and floaters. Pt report N/V today and yesterday. PT denies VB, abnormal discharge, and no recent intercourse.  ?GDM ?Hx of PreE ?

## 2021-11-30 NOTE — Progress Notes (Signed)
[facility] ED physician called [facility] nurse at 2039 stating the pt was stable and that her blood work came back okay. There was no protein in her urine. He stated the pt has had some elevated BP's while in the ED and that the pt states she has had some floaters in her eyes occasionally. Dr. Alysia Penna was notified at 2046 about the pt and her status and stated he would like to transfer the pt to MAU for further evaluation. [facility] called to notify about transferring the pt and MAU made aware as well.  ?

## 2021-11-30 NOTE — ED Triage Notes (Signed)
Pt presents to ED with complaints of contractions, unsure of how often, leaking yellow fluid since 0100. Pt states she hasn't felt the baby move since yesterday. Pt states she is a G4P2, hx of 2 c/s, gestational diabetic. 35.5 weeks.  ?

## 2021-11-30 NOTE — ED Notes (Signed)
OB rapid response called to monitor patient. Patient on toco monitor.  ?

## 2021-11-30 NOTE — Progress Notes (Signed)
Received a call from [facility] RN. Pt is a G4P2 at 35 4/[redacted] weeks gestation presenting with c/o contractions and leaking yellow fluid since 01003/3/23. No vaginal bleeding. Says she hasn't felt the baby move. She has had two previous C/S, one at 36 weeks, sha says that she had diabetes and her baby was big. The second one at 37 weeks for preeclampsia. Pt says she is gestational diabetic and takes insulin. ?

## 2021-11-30 NOTE — Progress Notes (Addendum)
[facility] nurse called [facility] at 2002 to check in on the pt because the FHR was not currently tracing and was told the patient had just gotten up to use the restroom and they would go adjust the monitors. They had just sent urine and blood work on the pt and were awaiting results. Pt stable at this time. Still no contractions on the monitor and reactive FHR strip noted.  ?

## 2021-11-30 NOTE — MAU Provider Note (Signed)
History     [id number]  Arrival date and time: 11/30/21 1734    Chief Complaint  Patient presents with   Contractions     HPI Claire Harrison is a 26 y.o. at 54w4dby LMP with PMHx notable for diabetes, preeclampsia, & c/section, who presents for headache, vaginal discharge, & contractions. [facility] been transferred to [facility] for further evaluation.  Reports possible leaking watery fluid since 1 am this morning. Has had some contractions as well but can't tell how frequently it's occurring.  [facility]. States movement has returned after receiving IV fluids there.  Has been having headache throughout the pregnancy. [facility] headache improved somewhat but still rates it 5/10. Denies visual disturbance or epigastric pain.    OB History     Gravida  4   Para  2   Term  1   Preterm  1   AB  1   Living  2      SAB  1   IAB      Ectopic      Multiple      Live Births  2           Past Medical History:  Diagnosis Date   Anxiety    Asthma    Blood transfusion without reported diagnosis    Depression    Gestational diabetes    Pre-eclampsia     Past Surgical History:  Procedure Laterality Date   CESAREAN SECTION     2018, 2020   DILATION AND CURETTAGE OF UTERUS N/A 08/01/2016   Procedure: SUCTION DILATATION AND CURETTAGE;  Surgeon: JJonnie Kind MD;  Location: [facility];  Service: Gynecology;  Laterality: N/A;    Family History  Problem Relation Age of Onset   Diabetes Mother    Hypertension Mother    Kidney failure Mother    M62/ [place]    Diabetes Father    Diabetes Sister    Miscarriages / [place]ister    Diabetes Sister    Miscarriages / Stillbirths Sister    Scoliosis Brother    Heart murmur Daughter    Bronchitis Son    Colon cancer Maternal Aunt    Diabetes Maternal Grandmother    Kidney  failure Maternal Grandmother     Allergies  Allergen Reactions   Penicillins Anaphylaxis   Bee Venom Hives   Mango Flavor Hives and Swelling   Peanut-Containing Drug Products    Latex Rash    No current facility-administered medications on file prior to encounter.   Current Outpatient Medications on File Prior to Encounter  Medication Sig Dispense Refill   acetaminophen (TYLENOL) 325 MG tablet Take 650 mg by mouth every 6 (six) hours as needed.     aspirin 81 MG EC tablet Take 2 tablets (162 mg total) by mouth daily. Swallow whole. 180 tablet 2   butalbital-acetaminophen-caffeine (FIORICET) 50-325-40 MG tablet Take 1-2 tablets by mouth every 6 (six) hours as needed for headache. 20 tablet 0   Doxylamine-Pyridoxine (DICLEGIS) 10-10 MG TBEC 2 tabs q hs, if sx persist add 1 tab q am on day 3, if sx persist add 1 tab q afternoon on day 4 100 tablet 6   HUMALOG KWIKPEN 100 UNIT/ML KwikPen 18 units with each meal 3 mL 11   insulin glargine (LANTUS) 100 unit/mL SOPN Inject 35 Units into the  skin 2 (two) times daily. 15 mL 11   Prenatal Vit-Fe Fumarate-FA (PRENATAL VITAMIN PO) Take by mouth daily.      ACCU-CHEK GUIDE test strip USE 1 STRIP TO CHECK GLUCOSE 4 TIMES DAILY AS  INSTRUCTED 150 each 5   Accu-Chek Softclix Lancets lancets 100 each by Other route 4 (four) times daily. Use as instructed 100 each 12   ATROVENT HFA 17 MCG/ACT inhaler Inhale into the lungs. (Patient not taking: Reported on 11/29/2021)     Blood Glucose Monitoring Suppl (ACCU-CHEK GUIDE) w/Device KIT USE AS DIRECTED 1 kit 0   Blood Pressure Monitor MISC For regular home bp monitoring during pregnancy 1 each 0   GLOBAL EASE INJECT PEN NEEDLES 32G X 4 MM MISC  (Patient not taking: Reported on 11/29/2021)     insulin glargine (LANTUS) 100 unit/mL SOPN Inject 35 Units into the skin 2 (two) times daily. 15 mL 11   ondansetron (ZOFRAN ODT) 8 MG disintegrating tablet Take 1 tablet (8 mg total) by mouth every 8 (eight) hours as needed  for nausea or vomiting. 20 tablet 0     ROS Pertinent positives and negative per HPI, all others reviewed and negative  Physical Exam   BP 118/70    Pulse 71    Temp 98.1 F (36.7 C) (Oral)    Resp 18    Ht _0  (1.575 m)    Wt 117.9 kg    LMP 03/26/2021    SpO2 99%    BMI 47.55 kg/m   Patient Vitals for the past 24 hrs:  BP Temp Temp src Pulse Resp SpO2 Height Weight  12/01/21 0030 118/70 -- -- 71 -- -- -- --  12/01/21 0015 123/72 -- -- 71 -- -- -- --  12/01/21 0000 130/82 -- -- 79 -- -- -- --  11/30/21 2317 120/78 -- -- 79 -- 99 % -- --  11/30/21 2315 120/78 98.1 F (36.7 C) Oral 78 18 -- -- --  11/30/21 2313 -- -- -- -- -- 98 % -- --  11/30/21 2030 137/72 -- -- 82 18 98 % -- --  11/30/21 2015 115/65 -- -- 72 18 96 % -- --  11/30/21 2000 (!) 147/74 -- -- 85 18 97 % -- --  11/30/21 1945 132/74 -- -- 85 18 95 % -- --  11/30/21 1930 126/88 -- -- 78 18 97 % -- --  11/30/21 1915 121/65 -- -- 76 18 97 % -- --  11/30/21 1900 106/77 -- -- 85 18 98 % -- --  11/30/21 1830 129/74 -- -- 79 18 97 % -- --  11/30/21 1752 (!) 150/92 98.7 F (37.1 C) Oral 93 18 100 % _1  (1.575 m) 117.9 kg    Physical Exam Vitals and nursing note reviewed. Exam conducted with a chaperone present.  Constitutional:      General: She is not in acute distress.    Appearance: Normal appearance. She is obese. She is not ill-appearing.  HENT:     Head: Normocephalic and atraumatic.  Eyes:     General: No scleral icterus.    Conjunctiva/sclera: Conjunctivae normal.  Pulmonary:     Effort: Pulmonary effort is normal. No respiratory distress.  Genitourinary:    Comments: Pelvic: no pooling of fluid Skin:    General: Skin is warm and dry.  Neurological:     General: No focal deficit present.     Mental Status: She is alert.  Psychiatric:  Mood and Affect: Mood normal.        Behavior: Behavior normal.     Cervical Exam Dilation: Closed Effacement (%): Thick Cervical Position:  Posterior Station: -3 Exam by:: Jorje Guild NP   FHT Baseline 135, moderate variability, 15x15 accels, no decels Toco: none Cat: 1  Labs Results for orders placed or performed during the hospital encounter of 11/30/21 (from the past 24 hour(s))  Urinalysis, Routine w reflex microscopic Urine, Clean Catch     Status: None   Collection Time: 11/30/21  6:02 PM  Result Value Ref Range   Color, Urine YELLOW YELLOW   APPearance CLEAR CLEAR   Specific Gravity, Urine 1.011 1.005 - 1.030   pH 6.0 5.0 - 8.0   Glucose, UA NEGATIVE NEGATIVE mg/dL   Hgb urine dipstick NEGATIVE NEGATIVE   Bilirubin Urine NEGATIVE NEGATIVE   Ketones, ur NEGATIVE NEGATIVE mg/dL   Protein, ur NEGATIVE NEGATIVE mg/dL   Nitrite NEGATIVE NEGATIVE   Leukocytes,Ua NEGATIVE NEGATIVE  Protein / creatinine ratio, urine     Status: Abnormal   Collection Time: 11/30/21  6:02 PM  Result Value Ref Range   Creatinine, Urine 62.79 mg/dL   Total Protein, Urine 19 mg/dL   Protein Creatinine Ratio 0.30 (H) 0.00 - 0.15 mg/mg[Cre]  CBC with Differential     Status: Abnormal   Collection Time: 11/30/21  6:23 PM  Result Value Ref Range   WBC 8.0 4.0 - 10.5 K/uL   RBC 4.23 3.87 - 5.11 MIL/uL   Hemoglobin 10.8 (L) 12.0 - 15.0 g/dL   HCT 34.0 (L) 36.0 - 46.0 %   MCV 80.4 80.0 - 100.0 fL   MCH 25.5 (L) 26.0 - 34.0 pg   MCHC 31.8 30.0 - 36.0 g/dL   RDW 13.9 11.5 - 15.5 %   Platelets 326 150 - 400 K/uL   nRBC 0.0 0.0 - 0.2 %   Neutrophils Relative % 68 %   Neutro Abs 5.4 1.7 - 7.7 K/uL   Lymphocytes Relative 22 %   Lymphs Abs 1.8 0.7 - 4.0 K/uL   Monocytes Relative 7 %   Monocytes Absolute 0.6 0.1 - 1.0 K/uL   Eosinophils Relative 1 %   Eosinophils Absolute 0.1 0.0 - 0.5 K/uL   Basophils Relative 0 %   Basophils Absolute 0.0 0.0 - 0.1 K/uL   Immature Granulocytes 2 %   Abs Immature Granulocytes 0.13 (H) 0.00 - 0.07 K/uL  Comprehensive metabolic panel     Status: Abnormal   Collection Time: 11/30/21  6:23 PM  Result  Value Ref Range   Sodium 136 135 - 145 mmol/L   Potassium 3.5 3.5 - 5.1 mmol/L   Chloride 110 98 - 111 mmol/L   CO2 19 (L) 22 - 32 mmol/L   Glucose, Bld 112 (H) 70 - 99 mg/dL   BUN 8 6 - 20 mg/dL   Creatinine, Ser 0.45 0.44 - 1.00 mg/dL   Calcium 8.5 (L) 8.9 - 10.3 mg/dL   Total Protein 6.3 (L) 6.5 - 8.1 g/dL   Albumin 2.3 (L) 3.5 - 5.0 g/dL   AST 27 15 - 41 U/L   ALT 28 0 - 44 U/L   Alkaline Phosphatase 119 38 - 126 U/L   Total Bilirubin 0.2 (L) 0.3 - 1.2 mg/dL   GFR, Estimated >60 >60 mL/min   Anion gap 7 5 - 15  Resp Panel by RT-PCR (Flu A&B, Covid) Nasopharyngeal Swab     Status: None   Collection Time:  11/30/21  6:44 PM   Specimen: Nasopharyngeal Swab; Nasopharyngeal(NP) swabs in vial transport medium  Result Value Ref Range   SARS Coronavirus 2 by RT PCR NEGATIVE NEGATIVE   Influenza A by PCR NEGATIVE NEGATIVE   Influenza B by PCR NEGATIVE NEGATIVE    Imaging No results found.  MAU Course  Procedures Lab Orders         Resp Panel by RT-PCR (Flu A&B, Covid) Nasopharyngeal Swab         CBC with Differential         Comprehensive metabolic panel         Urinalysis, Routine w reflex microscopic         Protein / creatinine ratio, urine         POCT fern test    Meds ordered this encounter  Medications   lactated ringers bolus 1,000 mL   acetaminophen (TYLENOL) tablet 650 mg   metoCLOPramide (REGLAN) tablet 10 mg   cyclobenzaprine (FLEXERIL) tablet 10 mg   Imaging Orders  No imaging studies ordered today    MDM Reviewed evaluation, labs, and vitals from [facility] encounter.  She had 2 elevated BPs at [facility]. Remained normotensive in MAU. Protein creatinine ratio is 0.3. Headache improved with medication given in MAU. She has close follow up in the office this week.   Sterile speculum exam performed. No pooling of fluid. Fern slide collected & resulted by me - negative.  Cervix closed & no contractions on monitor.  Reactive fetal tracing.  Assessment and  Plan   1. Abdominal pain during pregnancy in third trimester   2. Encounter for suspected PROM, with rupture of membranes not found   3. Pregnancy headache in third trimester   4. [redacted] weeks gestation of pregnancy    -reviewed reasons to return to MAU -keep scheduled appointment in the office on Tuesday  Jorje Guild, NP 12/01/21 1:01 AM

## 2021-11-30 NOTE — ED Provider Notes (Signed)
[facility] EMERGENCY DEPARTMENT Provider Note   CSN: [id number] Arrival date & time: 11/30/21  1734     History  Chief Complaint  Patient presents with   Contractions    Claire Harrison is a 26 y.o. female.  HPI Patient is a G4, P2 female, currently 35 weeks 4 days pregnant, presenting for concern of decreased fetal movement as well as cramping abdominal pain, radiating to her back.  Onset was last night.  Pain is intermittent.  She was seen by her gynecology doctor yesterday.  Patient has been followed closely due to history of preeclampsia, current insulin-dependent diabetes, and ongoing scotomas.  Patient denies any vaginal bleeding or discharge.  She has felt like she has had decreased urine output and, at times, will have some clear fluid that she feels like is leaking out.  She has not had any gush of fluid.  Patient is unable to identify a frequency of her cramping pains.  She currently denies any pain.  She does not have any headache pain.  She does continue to endorse scotomas bilaterally which have been ongoing for several weeks.    Home Medications Prior to Admission medications   Medication Sig Start Date End Date Taking? Authorizing Provider  acetaminophen (TYLENOL) 325 MG tablet Take 650 mg by mouth every 6 (six) hours as needed.   Yes [provider]  aspirin 81 MG EC tablet Take 2 tablets (162 mg total) by mouth daily. Swallow whole. 07/01/21  Yes Roma Schanz, CNM  butalbital-acetaminophen-caffeine (FIORICET) [phone] MG tablet Take 1-2 tablets by mouth every 6 (six) hours as needed for headache. 11/08/21 11/08/22 Yes Myrtis Ser, CNM  Doxylamine-Pyridoxine (DICLEGIS) 10-10 MG TBEC 2 tabs q hs, if sx persist add 1 tab q am on day 3, if sx persist add 1 tab q afternoon on day 4 06/17/21  Yes Roma Schanz, CNM  HUMALOG KWIKPEN 100 UNIT/ML KwikPen 18 units with each meal 11/15/21  Yes Florian Buff, MD  insulin glargine (LANTUS) 100 unit/mL SOPN  Inject 35 Units into the skin 2 (two) times daily. 11/18/21 12/18/21 Yes Janyth Pupa, DO  Prenatal Vit-Fe Fumarate-FA (PRENATAL VITAMIN PO) Take by mouth daily.    Yes [provider]  ACCU-CHEK GUIDE test strip USE 1 STRIP TO CHECK GLUCOSE 4 TIMES DAILY AS  INSTRUCTED 09/26/19   Cassandria Anger, MD  Accu-Chek Softclix Lancets lancets 100 each by Other route 4 (four) times daily. Use as instructed 11/29/21   Florian Buff, MD  ATROVENT HFA 17 MCG/ACT inhaler Inhale into the lungs. Patient not taking: Reported on 11/29/2021 09/25/21   [provider]  Blood Glucose Monitoring Suppl (ACCU-CHEK GUIDE) w/Device KIT USE AS DIRECTED 09/26/19   Cassandria Anger, MD  Blood Pressure Monitor MISC For regular home bp monitoring during pregnancy 07/01/21   Roma Schanz, CNM  GLOBAL EASE INJECT PEN NEEDLES 32G X 4 MM MISC  11/15/21   [provider]  insulin glargine (LANTUS) 100 unit/mL SOPN Inject 35 Units into the skin 2 (two) times daily. 11/29/21   Florian Buff, MD  ondansetron (ZOFRAN ODT) 8 MG disintegrating tablet Take 1 tablet (8 mg total) by mouth every 8 (eight) hours as needed for nausea or vomiting. 07/30/21   Florian Buff, MD      Allergies    Penicillins, Bee venom, Mango flavor, Peanut-containing drug products, and Latex    Review of Systems   Review of Systems  Eyes:  Positive for visual disturbance.  Gastrointestinal:  Positive for abdominal pain.  Genitourinary:  Positive for decreased urine volume.  Musculoskeletal:  Positive for back pain.  All other systems reviewed and are negative.  Physical Exam Updated Vital Signs BP (!) 114/56    Pulse 63    Temp 98.1 F (36.7 C) (Oral)    Resp 18    Ht 5' 2"  (1.575 m)    Wt 117.9 kg    LMP 03/26/2021    SpO2 98%    BMI 47.55 kg/m  Physical Exam Vitals and nursing note reviewed.  Constitutional:      General: She is not in acute distress.    Appearance: Normal appearance. She is well-developed.  She is not ill-appearing, toxic-appearing or diaphoretic.  HENT:     Head: Normocephalic and atraumatic.     Right Ear: External ear normal.     Left Ear: External ear normal.     Nose: Nose normal.     Mouth/Throat:     Mouth: Mucous membranes are moist.     Pharynx: Oropharynx is clear.  Eyes:     Extraocular Movements: Extraocular movements intact.     Conjunctiva/sclera: Conjunctivae normal.  Cardiovascular:     Rate and Rhythm: Normal rate and regular rhythm.     Heart sounds: No murmur heard. Pulmonary:     Effort: Pulmonary effort is normal. No respiratory distress.     Breath sounds: Normal breath sounds. No wheezing or rales.  Chest:     Chest wall: No tenderness.  Abdominal:     General: There is distension.     Palpations: Abdomen is soft.     Tenderness: There is no abdominal tenderness.     Comments: Gravid uterus  Musculoskeletal:        General: No swelling.     Cervical back: Normal range of motion and neck supple.     Right lower leg: No edema.     Left lower leg: No edema.  Skin:    General: Skin is warm and dry.     Capillary Refill: Capillary refill takes less than 2 seconds.     Coloration: Skin is not jaundiced or pale.  Neurological:     General: No focal deficit present.     Mental Status: She is alert and oriented to person, place, and time.     Cranial Nerves: No cranial nerve deficit.     Sensory: No sensory deficit.     Motor: No weakness.     Coordination: Coordination normal.  Psychiatric:        Mood and Affect: Mood normal.        Behavior: Behavior normal.        Thought Content: Thought content normal.        Judgment: Judgment normal.    ED Results / Procedures / Treatments   Labs (all labs ordered are listed, but only abnormal results are displayed) Labs Reviewed  CBC WITH DIFFERENTIAL/PLATELET - Abnormal; Notable for the following components:      Result Value   Hemoglobin 10.8 (*)    HCT 34.0 (*)    MCH 25.5 (*)    Abs  Immature Granulocytes 0.13 (*)    All other components within normal limits  COMPREHENSIVE METABOLIC PANEL - Abnormal; Notable for the following components:   CO2 19 (*)    Glucose, Bld 112 (*)    Calcium 8.5 (*)    Total Protein 6.3 (*)  Albumin 2.3 (*)    Total Bilirubin 0.2 (*)    All other components within normal limits  PROTEIN / CREATININE RATIO, URINE - Abnormal; Notable for the following components:   Protein Creatinine Ratio 0.30 (*)    All other components within normal limits  RESP PANEL BY RT-PCR (FLU A&B, COVID) ARPGX2  URINALYSIS, ROUTINE W REFLEX MICROSCOPIC    EKG None  Radiology No results found.  Procedures Procedures    Medications Ordered in ED Medications  lactated ringers bolus 1,000 mL (0 mLs Intravenous Stopped 11/30/21 2029)  acetaminophen (TYLENOL) tablet 650 mg (650 mg Oral Given 11/30/21 2029)  metoCLOPramide (REGLAN) tablet 10 mg (10 mg Oral Given 11/30/21 2358)  cyclobenzaprine (FLEXERIL) tablet 10 mg (10 mg Oral Given 11/30/21 2358)    ED Course/ Medical Decision Making/ A&P                           Medical Decision Making Amount and/or Complexity of Data Reviewed Labs: ordered.  Risk OTC drugs. Decision regarding hospitalization.   This patient presents to the ED for concern of abdominal pain and decreased fetal movement, this involves an extensive number of treatment options, and is a complaint that carries with it a high risk of complications and morbidity.  The differential diagnosis includes premature labor, preeclampsia, fetal demise, uterine rupture, uterine abruption.   Co morbidities that complicate the patient evaluation  History preeclampsia, insulin-dependent diabetes, anxiety, asthma, depression   Additional history obtained:  Additional history obtained from N/A External records from outside source obtained and reviewed including EMR   Lab Tests:  I Ordered, and personally interpreted labs.  The pertinent results  include: Decrease in hemoglobin from baseline, no leukocytosis, normal platelet count, normal transaminases, no proteinuria   Cardiac Monitoring:  The patient was maintained on a cardiac monitor.  I personally viewed and interpreted the cardiac monitored which showed an underlying rhythm of: Sinus rhythm Tocometry shows good FHR variability and no contractions   Medicines ordered and prescription drug management:  I ordered medication including IV fluids and Tylenol for crampy pain Reevaluation of the patient after these medicines showed that the patient improved I have reviewed the patients home medicines and have made adjustments as needed  Consultations Obtained:  I requested consultation with the OB/GYN,  and discussed lab and imaging findings as well as pertinent plan - they recommend: Transfer to MAU for further monitoring   Problem List / ED Course:  Patient is a 26 year old female, G4, P2, currently 35-1/[redacted] weeks pregnant who presents for cramping abdominal pain that radiates to her lower back.  With her 2 prior deliveries, she did undergo early C-sections due to preeclampsia.  She has been closely monitored for high risk pregnancy given his history as well as her gestational diabetes.  Patient was last seen by her OB provider yesterday.  She reports that last night, she developed crampy pain.  She is unsure if this is contraction pain.  She has not had any gush of fluid but has felt like there has been some leaking which she feels may be urine.  She denies any discharge or bleeding.  Nitrazine test strip was negative (acidic).  Additionally, she has had ongoing headaches and scotomata's that are intermittent and have been present for several weeks.  She has brought this up to her OB providers.  Currently, she has improved abdominal pain.  She is well-appearing on exam.  Patient placed on bedside  cardiac monitor.  Additionally, fetal heart tones and uterine activity was monitored.  Fetal  heart rate was in the range of 150-160.  No evidence of contractions was seen on monitor.  There were some documented elevated blood pressures on arrival.  Due to habitus, placement of cuff on her upper arm was difficult.  Cuff was placed on her lower arm and blood pressures were monitored closely.  Patient had normal blood pressures during her ED observation.  Work-up was initiated.  Patient does not have any elevation in liver enzymes.  Platelets are normal.  Urinalysis did not show evidence of proteinuria.  She remained hemodynamically stable without any worsening of symptoms while in the ED.  I did discuss with OB/GYN on-call who does recommend transfer to MAU for further monitoring.  Patient is agreeable to this plan.  She was transferred in stable condition.    Social Determinants of Health:  Has family support and access to outpatient care           Final Clinical Impression(s) / ED Diagnoses Final diagnoses:  Abdominal pain during pregnancy in third trimester  Encounter for suspected PROM, with rupture of membranes not found  Pregnancy headache in third trimester  [redacted] weeks gestation of pregnancy    Rx / DC Orders ED Discharge Orders          Ordered    Discharge patient        12/01/21 0057              Godfrey Pick, MD 12/01/21 1437

## 2021-12-01 DIAGNOSIS — Z0371 Encounter for suspected problem with amniotic cavity and membrane ruled out: Secondary | ICD-10-CM | POA: Diagnosis not present

## 2021-12-01 DIAGNOSIS — Z3A35 35 weeks gestation of pregnancy: Secondary | ICD-10-CM

## 2021-12-01 DIAGNOSIS — O26893 Other specified pregnancy related conditions, third trimester: Secondary | ICD-10-CM

## 2021-12-01 DIAGNOSIS — R109 Unspecified abdominal pain: Secondary | ICD-10-CM

## 2021-12-01 DIAGNOSIS — R519 Headache, unspecified: Secondary | ICD-10-CM

## 2021-12-01 NOTE — MAU Note (Signed)
Patient was discharged and medically cleared by the provider at midnight. Patient needs a ride to her car which is located at [facility]. Nurse is currently working on getting patient an uber to her car.  ?

## 2021-12-02 LAB — GLUCOSE, CAPILLARY
Glucose-Capillary: 69 mg/dL — ABNORMAL LOW (ref 70–99)
Glucose-Capillary: 112 mg/dL — ABNORMAL HIGH (ref 70–99)

## 2021-12-03 ENCOUNTER — Ambulatory Visit (INDEPENDENT_AMBULATORY_CARE_PROVIDER_SITE_OTHER): Payer: Medicaid Other

## 2021-12-03 ENCOUNTER — Other Ambulatory Visit: Payer: Self-pay

## 2021-12-03 VITALS — BP 132/85 | HR 85

## 2021-12-03 DIAGNOSIS — Z3689 Encounter for other specified antenatal screening: Secondary | ICD-10-CM

## 2021-12-03 DIAGNOSIS — Z3A36 36 weeks gestation of pregnancy: Secondary | ICD-10-CM | POA: Diagnosis not present

## 2021-12-03 DIAGNOSIS — Z331 Pregnant state, incidental: Secondary | ICD-10-CM

## 2021-12-03 DIAGNOSIS — Z1389 Encounter for screening for other disorder: Secondary | ICD-10-CM

## 2021-12-03 LAB — POCT URINALYSIS DIPSTICK OB
Blood, UA: NEGATIVE
Glucose, UA: NEGATIVE
Ketones, UA: NEGATIVE
Leukocytes, UA: NEGATIVE
Nitrite, UA: NEGATIVE
POC,PROTEIN,UA: NEGATIVE

## 2021-12-03 NOTE — Progress Notes (Signed)
? ?  NURSE VISIT- NST ? ?SUBJECTIVE:  ?Claire Harrison is a 26 y.o. [phone] female at [redacted]w[redacted]d, here for a NST for pregnancy complicated by A2/BDM currently on Lantus 35 units AM, 35 units PM, & Humalog 18 units TID .  She reports active fetal movement, contractions: none, vaginal bleeding: none, membranes: intact.  ? ?OBJECTIVE:  ?BP 132/85   Pulse 85   LMP 03/26/2021   ?Appears well, no apparent distress ? ?Results for orders placed or performed in visit on 12/03/21 (from the past 24 hour(s))  ?POC Urinalysis Dipstick OB  ? Collection Time: 12/03/21 10:13 AM  ?Result Value Ref Range  ? Color, UA    ? Clarity, UA    ? Glucose, UA Negative Negative  ? Bilirubin, UA    ? Ketones, UA neg   ? Spec Grav, UA    ? Blood, UA neg   ? pH, UA    ? POC,PROTEIN,UA Negative Negative, Trace, Small (1+), Moderate (2+), Large (3+), 4+  ? Urobilinogen, UA    ? Nitrite, UA neg   ? Leukocytes, UA Negative Negative  ? Appearance    ? Odor    ? ? ?NST: FHR baseline 140 bpm, Variability: moderate, Accelerations:present, Decelerations:  Absent= Cat 1/reactive ?Toco: none  ? ?ASSESSMENT: ?[id number] at [redacted]w[redacted]d with A2/BDM currently on Lantus 35 units AM, 35 units PM, & Humalog 18 units TID ?NST reactive ? ?PLAN: ?EFM strip interpreted & reviewed by Joellyn Haff, CNM, WHNP   ?Recommendations: keep next appointment as scheduled   ? ?Tacia Hindley A Hollynn Garno  ?12/03/2021 ?12:40 PM  ?

## 2021-12-06 ENCOUNTER — Ambulatory Visit (INDEPENDENT_AMBULATORY_CARE_PROVIDER_SITE_OTHER): Payer: Medicaid Other | Admitting: Obstetrics & Gynecology

## 2021-12-06 ENCOUNTER — Other Ambulatory Visit: Payer: Self-pay

## 2021-12-06 ENCOUNTER — Ambulatory Visit: Payer: Medicaid Other | Admitting: *Deleted

## 2021-12-06 ENCOUNTER — Other Ambulatory Visit (HOSPITAL_COMMUNITY)
Admission: RE | Admit: 2021-12-06 | Discharge: 2021-12-06 | Disposition: A | Discharge status: Home / Self Care | Payer: Medicaid Other | Source: Ambulatory Visit | Attending: Obstetrics & Gynecology | Admitting: Obstetrics & Gynecology

## 2021-12-06 VITALS — BP 138/89 | HR 85 | Wt 269.2 lb

## 2021-12-06 DIAGNOSIS — O24319 Unspecified pre-existing diabetes mellitus in pregnancy, unspecified trimester: Secondary | ICD-10-CM

## 2021-12-06 DIAGNOSIS — O0993 Supervision of high risk pregnancy, unspecified, third trimester: Secondary | ICD-10-CM

## 2021-12-06 DIAGNOSIS — Z331 Pregnant state, incidental: Secondary | ICD-10-CM

## 2021-12-06 DIAGNOSIS — O288 Other abnormal findings on antenatal screening of mother: Secondary | ICD-10-CM

## 2021-12-06 DIAGNOSIS — Z1389 Encounter for screening for other disorder: Secondary | ICD-10-CM

## 2021-12-06 DIAGNOSIS — Z3A36 36 weeks gestation of pregnancy: Secondary | ICD-10-CM | POA: Insufficient documentation

## 2021-12-06 LAB — POCT URINALYSIS DIPSTICK OB
Blood, UA: NEGATIVE
Glucose, UA: NEGATIVE
Ketones, UA: NEGATIVE
Leukocytes, UA: NEGATIVE
Nitrite, UA: NEGATIVE

## 2021-12-06 NOTE — Progress Notes (Signed)
HIGH-RISK PREGNANCY VISIT Patient name: Claire Harrison MRN [id number]  Date of birth: 09-Mar-1996 Chief Complaint:   High Risk Gestation and Routine Prenatal Visit (Culture/seeing flashing light)  History of Present Illness:   Claire Harrison is a 26 y.o. [id number] female at [redacted]w[redacted]d with an Estimated Date of Delivery: 12/31/21 being seen today for ongoing management of a high-risk pregnancy complicated by diabetes mellitus Class B DM.    Today she reports no complaints.  .  .   . denies leaking of fluid.      07/01/2021   11:02 AM 05/17/2021    9:19 AM 10/06/2018   10:52 AM 10/13/2016   11:19 AM 09/15/2016   10:39 AM  Depression screen PHQ 2/9  Decreased Interest 1 1 3  0 0  Down, Depressed, Hopeless 1 0 3 1 2   PHQ - 2 Score 2 1 6 1 2   Altered sleeping 2 1  0 0  Tired, decreased energy 2 2 3  0 0  Change in appetite 1 0 0 0 0  Feeling bad or failure about yourself  0 0 2 0 0  Trouble concentrating 0 0 0 0 0  Moving slowly or fidgety/restless 0 0 0 0 0  Suicidal thoughts 0 0 0 0 0  PHQ-9 Score 7 4  1 2   Difficult doing work/chores   Very difficult          07/01/2021   11:02 AM 05/17/2021    9:20 AM  GAD 7 : Generalized Anxiety Score  Nervous, Anxious, on Edge 2 1  Control/stop worrying 1 1  Worry too much - different things 1 0  Trouble relaxing 1 0  Restless 0 0  Easily annoyed or irritable 1 1  Afraid - awful might happen 1 0  Total GAD 7 Score 7 3     Review of Systems:   Pertinent items are noted in HPI Denies abnormal vaginal discharge w/ itching/odor/irritation, headaches, visual changes, shortness of breath, chest pain, abdominal pain, severe nausea/vomiting, or problems with urination or bowel movements unless otherwise stated above. Pertinent History Reviewed:  Reviewed past medical,surgical, social, obstetrical and family history.  Reviewed problem list, medications and allergies. Physical Assessment:  There were no vitals filed for this visit.There is no height  or weight on file to calculate BMI.           Physical Examination:   General appearance: alert, well appearing, and in no distress  Mental status: alert, oriented to person, place, and time  Skin: warm & dry   Extremities:      Cardiovascular: normal heart rate noted  Respiratory: normal respiratory effort, no distress  Abdomen: gravid, soft, non-tender  Pelvic: Cervical exam deferred         Fetal Status:          Fetal Surveillance Testing today: Reactive NST  Claire Harrison is at Unknown Estimated Date of Delivery: None noted.  NST being performed due to Class B DM  Today the NST is Reactive  Fetal Monitoring:  Baseline: 140s bpm, Variability: Good {> 6 bpm), Accelerations: Reactive, and Decelerations: Absent   reactive  The accelerations are >15 bpm and more than 2 in 20 minutes  Final diagnosis:  Reactive NST  Claire Arms, MD     Chaperone: Faith Rogue  No results found for this or any previous visit (from the past 24 hour(s)).   Assessment & Plan:  High-risk pregnancy: [id number] at [redacted]w[redacted]d with an Estimated Date of Delivery:  12/31/21   1) Class B DM, stable on lantus 35 BID + humolog 20-22 with meals  2) Previous C section x 2, repeat at 39 weeks  or as clinically indicated,   Meds: No orders of the defined types were placed in this encounter.   Labs/procedures today: NST  Treatment Plan:  continue current care plan twice weekly surveillance  Reviewed: Preterm labor symptoms and general obstetric precautions including but not limited to vaginal bleeding, contractions, leaking of fluid and fetal movement were reviewed in detail with the patient.  All questions were answered. Does have home bp cuff. Office bp cuff given: not applicable. Check bp daily, let us know if consistently >140 and/or >90.  Follow-up: Return for keep scheduled.   Future Appointments  Date Time Provider Department Center  01/15/2022 11:10 AM Arabella Merles, CNM [facility]     Orders Placed This Encounter  Procedures   Strep Gp B NAA+Rflx   Claire Harrison  12/26/2021 6:31 AM

## 2021-12-08 LAB — STREP GP B NAA+RFLX: Strep Gp B NAA+Rflx: NEGATIVE

## 2021-12-09 LAB — CERVICOVAGINAL ANCILLARY ONLY
Chlamydia: NEGATIVE
Comment: NEGATIVE
Comment: NORMAL
Neisseria Gonorrhea: NEGATIVE

## 2021-12-10 ENCOUNTER — Inpatient Hospital Stay (HOSPITAL_COMMUNITY)
Admission: AD | Admit: 2021-12-10 | Discharge: 2021-12-12 | DRG: 788 | Disposition: A | Discharge status: Home / Self Care | Payer: Medicaid Other | Attending: Obstetrics and Gynecology | Admitting: Obstetrics and Gynecology

## 2021-12-10 ENCOUNTER — Inpatient Hospital Stay (HOSPITAL_COMMUNITY): Payer: Medicaid Other | Admitting: Certified Registered Nurse Anesthetist

## 2021-12-10 ENCOUNTER — Ambulatory Visit (INDEPENDENT_AMBULATORY_CARE_PROVIDER_SITE_OTHER): Payer: Medicaid Other | Admitting: *Deleted

## 2021-12-10 ENCOUNTER — Encounter (HOSPITAL_COMMUNITY)
Admission: AD | Disposition: A | Discharge status: Home / Self Care | Payer: Self-pay | Source: Home / Self Care | Attending: Obstetrics and Gynecology

## 2021-12-10 ENCOUNTER — Other Ambulatory Visit: Payer: Self-pay

## 2021-12-10 ENCOUNTER — Encounter (HOSPITAL_COMMUNITY): Payer: Self-pay | Admitting: Obstetrics and Gynecology

## 2021-12-10 VITALS — BP 127/79 | HR 74 | Wt 271.0 lb

## 2021-12-10 DIAGNOSIS — O0993 Supervision of high risk pregnancy, unspecified, third trimester: Secondary | ICD-10-CM

## 2021-12-10 DIAGNOSIS — Z20822 Contact with and (suspected) exposure to covid-19: Secondary | ICD-10-CM | POA: Diagnosis present

## 2021-12-10 DIAGNOSIS — O34219 Maternal care for unspecified type scar from previous cesarean delivery: Secondary | ICD-10-CM | POA: Diagnosis not present

## 2021-12-10 DIAGNOSIS — Z30017 Encounter for initial prescription of implantable subdermal contraceptive: Secondary | ICD-10-CM | POA: Diagnosis not present

## 2021-12-10 DIAGNOSIS — Z3A37 37 weeks gestation of pregnancy: Secondary | ICD-10-CM | POA: Diagnosis not present

## 2021-12-10 DIAGNOSIS — E119 Type 2 diabetes mellitus without complications: Secondary | ICD-10-CM

## 2021-12-10 DIAGNOSIS — Z794 Long term (current) use of insulin: Secondary | ICD-10-CM | POA: Diagnosis not present

## 2021-12-10 DIAGNOSIS — O1494 Unspecified pre-eclampsia, complicating childbirth: Secondary | ICD-10-CM

## 2021-12-10 DIAGNOSIS — Z2839 Other underimmunization status: Secondary | ICD-10-CM

## 2021-12-10 DIAGNOSIS — O2412 Pre-existing diabetes mellitus, type 2, in childbirth: Secondary | ICD-10-CM | POA: Diagnosis present

## 2021-12-10 DIAGNOSIS — O09899 Supervision of other high risk pregnancies, unspecified trimester: Secondary | ICD-10-CM

## 2021-12-10 DIAGNOSIS — O99214 Obesity complicating childbirth: Secondary | ICD-10-CM | POA: Diagnosis present

## 2021-12-10 DIAGNOSIS — Z8759 Personal history of other complications of pregnancy, childbirth and the puerperium: Secondary | ICD-10-CM

## 2021-12-10 DIAGNOSIS — O1490 Unspecified pre-eclampsia, unspecified trimester: Secondary | ICD-10-CM

## 2021-12-10 DIAGNOSIS — O24424 Gestational diabetes mellitus in childbirth, insulin controlled: Secondary | ICD-10-CM | POA: Diagnosis not present

## 2021-12-10 DIAGNOSIS — O34211 Maternal care for low transverse scar from previous cesarean delivery: Secondary | ICD-10-CM | POA: Diagnosis present

## 2021-12-10 DIAGNOSIS — Z880 Allergy status to penicillin: Secondary | ICD-10-CM | POA: Diagnosis not present

## 2021-12-10 DIAGNOSIS — Z7982 Long term (current) use of aspirin: Secondary | ICD-10-CM

## 2021-12-10 DIAGNOSIS — Z98891 History of uterine scar from previous surgery: Secondary | ICD-10-CM

## 2021-12-10 DIAGNOSIS — O1414 Severe pre-eclampsia complicating childbirth: Secondary | ICD-10-CM | POA: Diagnosis present

## 2021-12-10 DIAGNOSIS — O0990 Supervision of high risk pregnancy, unspecified, unspecified trimester: Secondary | ICD-10-CM

## 2021-12-10 LAB — URINALYSIS, ROUTINE W REFLEX MICROSCOPIC
Bilirubin Urine: NEGATIVE
Glucose, UA: 50 mg/dL — AB
Hgb urine dipstick: NEGATIVE
Ketones, ur: NEGATIVE mg/dL
Leukocytes,Ua: NEGATIVE
Nitrite: NEGATIVE
Protein, ur: 100 mg/dL — AB
Specific Gravity, Urine: 1.025 (ref 1.005–1.030)
pH: 6 (ref 5.0–8.0)

## 2021-12-10 LAB — POCT URINALYSIS DIPSTICK OB
Blood, UA: NEGATIVE
Glucose, UA: NEGATIVE
Ketones, UA: NEGATIVE
Leukocytes, UA: NEGATIVE
Nitrite, UA: NEGATIVE

## 2021-12-10 LAB — COMPREHENSIVE METABOLIC PANEL
ALT: 15 U/L (ref 0–44)
AST: 18 U/L (ref 15–41)
Albumin: 2.3 g/dL — ABNORMAL LOW (ref 3.5–5.0)
Alkaline Phosphatase: 115 U/L (ref 38–126)
Anion gap: 8 (ref 5–15)
BUN: 11 mg/dL (ref 6–20)
CO2: 21 mmol/L — ABNORMAL LOW (ref 22–32)
Calcium: 9.1 mg/dL (ref 8.9–10.3)
Chloride: 107 mmol/L (ref 98–111)
Creatinine, Ser: 0.63 mg/dL (ref 0.44–1.00)
GFR, Estimated: >60 mL/min (ref >60)
Glucose, Bld: 221 mg/dL — ABNORMAL HIGH (ref 70–99)
Potassium: 3.9 mmol/L (ref 3.5–5.1)
Sodium: 136 mmol/L (ref 135–145)
Total Bilirubin: <0.1 mg/dL — ABNORMAL LOW (ref 0.3–1.2)
Total Protein: 6 g/dL — ABNORMAL LOW (ref 6.5–8.1)

## 2021-12-10 LAB — CBC
HCT: 34.1 % — ABNORMAL LOW (ref 36.0–46.0)
Hemoglobin: 11.4 g/dL — ABNORMAL LOW (ref 12.0–15.0)
MCH: 26.4 pg (ref 26.0–34.0)
MCHC: 33.4 g/dL (ref 30.0–36.0)
MCV: 78.9 fL — ABNORMAL LOW (ref 80.0–100.0)
Platelets: 352 10*3/uL (ref 150–400)
RBC: 4.32 MIL/uL (ref 3.87–5.11)
RDW: 13.8 % (ref 11.5–15.5)
WBC: 10.9 10*3/uL — ABNORMAL HIGH (ref 4.0–10.5)
nRBC: 0 % (ref 0.0–0.2)

## 2021-12-10 LAB — GLUCOSE, CAPILLARY
Glucose-Capillary: 213 mg/dL — ABNORMAL HIGH (ref 70–99)
Glucose-Capillary: 186 mg/dL — ABNORMAL HIGH (ref 70–99)

## 2021-12-10 LAB — RESP PANEL BY RT-PCR (FLU A&B, COVID) ARPGX2
Influenza A by PCR: NEGATIVE
Influenza B by PCR: NEGATIVE
SARS Coronavirus 2 by RT PCR: NEGATIVE

## 2021-12-10 LAB — PROTEIN / CREATININE RATIO, URINE
Creatinine, Urine: 164.14 mg/dL
Protein Creatinine Ratio: 1.41 mg/mg{Cre} — ABNORMAL HIGH (ref 0.00–0.15)
Total Protein, Urine: 232 mg/dL

## 2021-12-10 SURGERY — Surgical Case
Anesthesia: Spinal

## 2021-12-10 MED ORDER — KETOROLAC TROMETHAMINE 30 MG/ML IJ SOLN
INTRAMUSCULAR | Status: AC
  Filled 2021-12-10: qty 1

## 2021-12-10 MED ORDER — BUPIVACAINE IN DEXTROSE 0.75-8.25 % IT SOLN
INTRATHECAL | Status: DC | PRN
  Administered 2021-12-10: 1.6 mL via INTRATHECAL

## 2021-12-10 MED ORDER — KETOROLAC TROMETHAMINE 30 MG/ML IJ SOLN: 30.0000 mg | Freq: Once | INTRAMUSCULAR | Status: DC | PRN

## 2021-12-10 MED ORDER — SCOPOLAMINE 1 MG/3DAYS TD PT72
MEDICATED_PATCH | TRANSDERMAL | Status: AC
  Filled 2021-12-10: qty 1

## 2021-12-10 MED ORDER — OXYTOCIN-SODIUM CHLORIDE 30-0.9 UT/500ML-% IV SOLN
INTRAVENOUS | Status: AC
  Filled 2021-12-10: qty 500

## 2021-12-10 MED ORDER — ACETAMINOPHEN 500 MG PO TABS
1000.0000 mg | ORAL_TABLET | Freq: Once | ORAL | Status: AC
  Administered 2021-12-10: 1000 mg via ORAL
  Filled 2021-12-10: qty 2

## 2021-12-10 MED ORDER — DIPHENHYDRAMINE HCL 25 MG PO CAPS: 25.0000 mg | ORAL_CAPSULE | Freq: Four times a day (QID) | ORAL | Status: DC | PRN

## 2021-12-10 MED ORDER — DEXAMETHASONE SODIUM PHOSPHATE 4 MG/ML IJ SOLN
INTRAMUSCULAR | Status: DC | PRN
  Administered 2021-12-10: 4 mg via INTRAVENOUS

## 2021-12-10 MED ORDER — LACTATED RINGERS IV BOLUS
1000.0000 mL | Freq: Once | INTRAVENOUS | Status: AC
Start: 2021-12-10 — End: 2021-12-10
  Administered 2021-12-10: 1000 mL via INTRAVENOUS

## 2021-12-10 MED ORDER — LACTATED RINGERS IV SOLN
INTRAVENOUS | Status: DC
Start: 2021-12-10 — End: 2021-12-10

## 2021-12-10 MED ORDER — CLINDAMYCIN PHOSPHATE 900 MG/50ML IV SOLN
INTRAVENOUS | Status: AC
  Filled 2021-12-10: qty 50

## 2021-12-10 MED ORDER — GENTAMICIN SULFATE 40 MG/ML IJ SOLN
5.0000 mg/kg | Freq: Once | INTRAMUSCULAR | Status: AC
  Administered 2021-12-10: 400 mg via INTRAVENOUS
  Filled 2021-12-10: qty 10

## 2021-12-10 MED ORDER — DIPHENHYDRAMINE HCL 50 MG/ML IJ SOLN
12.5000 mg | INTRAMUSCULAR | Status: DC | PRN
  Administered 2021-12-10 – 2021-12-11 (×2): 12.5 mg via INTRAVENOUS
  Filled 2021-12-10: qty 1

## 2021-12-10 MED ORDER — KETOROLAC TROMETHAMINE 30 MG/ML IJ SOLN
30.0000 mg | Freq: Once | INTRAMUSCULAR | Status: AC
  Administered 2021-12-10: 30 mg via INTRAMUSCULAR

## 2021-12-10 MED ORDER — DEXAMETHASONE SODIUM PHOSPHATE 10 MG/ML IJ SOLN
10.0000 mg | Freq: Once | INTRAMUSCULAR | Status: AC
  Administered 2021-12-10: 10 mg via INTRAVENOUS
  Filled 2021-12-10: qty 1

## 2021-12-10 MED ORDER — LABETALOL HCL 5 MG/ML IV SOLN: 40.0000 mg | INTRAVENOUS | Status: DC | PRN

## 2021-12-10 MED ORDER — GENTAMICIN SULFATE 40 MG/ML IJ SOLN: 5.0000 mg/kg | INTRAVENOUS | Status: DC

## 2021-12-10 MED ORDER — ONDANSETRON HCL 4 MG/2ML IJ SOLN
INTRAMUSCULAR | Status: AC
  Filled 2021-12-10: qty 2

## 2021-12-10 MED ORDER — LABETALOL HCL 5 MG/ML IV SOLN: 20.0000 mg | INTRAVENOUS | Status: DC | PRN

## 2021-12-10 MED ORDER — SIMETHICONE 80 MG PO CHEW: 80.0000 mg | CHEWABLE_TABLET | ORAL | Status: DC | PRN

## 2021-12-10 MED ORDER — MAGNESIUM SULFATE 40 GM/1000ML IV SOLN
2.0000 g/h | INTRAVENOUS | Status: AC
  Administered 2021-12-10 – 2021-12-11 (×2): 2 g/h via INTRAVENOUS
  Filled 2021-12-10 (×2): qty 1000

## 2021-12-10 MED ORDER — SOD CITRATE-CITRIC ACID 500-334 MG/5ML PO SOLN
30.0000 mL | ORAL | Status: AC
  Administered 2021-12-10: 30 mL via ORAL
  Filled 2021-12-10: qty 30

## 2021-12-10 MED ORDER — ACETAMINOPHEN 10 MG/ML IV SOLN
INTRAVENOUS | Status: DC | PRN
  Administered 2021-12-10: 1000 mg via INTRAVENOUS

## 2021-12-10 MED ORDER — DIPHENHYDRAMINE HCL 25 MG PO CAPS: 25.0000 mg | ORAL_CAPSULE | ORAL | Status: DC | PRN

## 2021-12-10 MED ORDER — COCONUT OIL OIL: 1.0000 "application " | TOPICAL_OIL | Status: DC | PRN

## 2021-12-10 MED ORDER — GABAPENTIN 100 MG PO CAPS
100.0000 mg | ORAL_CAPSULE | Freq: Three times a day (TID) | ORAL | Status: DC
  Administered 2021-12-11 – 2021-12-12 (×5): 100 mg via ORAL
  Filled 2021-12-10 (×5): qty 1

## 2021-12-10 MED ORDER — LABETALOL HCL 5 MG/ML IV SOLN: 80.0000 mg | INTRAVENOUS | Status: DC | PRN

## 2021-12-10 MED ORDER — ONDANSETRON HCL 4 MG/2ML IJ SOLN
4.0000 mg | Freq: Three times a day (TID) | INTRAMUSCULAR | Status: DC | PRN
  Administered 2021-12-11: 4 mg via INTRAVENOUS
  Filled 2021-12-10: qty 2

## 2021-12-10 MED ORDER — CLINDAMYCIN PHOSPHATE 900 MG/50ML IV SOLN: 900.0000 mg | INTRAVENOUS | Status: DC

## 2021-12-10 MED ORDER — MAGNESIUM SULFATE 40 GM/1000ML IV SOLN
INTRAVENOUS | Status: DC | PRN
  Administered 2021-12-10: 2 g/h via INTRAVENOUS

## 2021-12-10 MED ORDER — MENTHOL 3 MG MT LOZG: 1.0000 | LOZENGE | OROMUCOSAL | Status: DC | PRN

## 2021-12-10 MED ORDER — DIPHENHYDRAMINE HCL 50 MG/ML IJ SOLN
12.5000 mg | Freq: Once | INTRAMUSCULAR | Status: AC
  Administered 2021-12-10: 12.5 mg via INTRAVENOUS
  Filled 2021-12-10: qty 1

## 2021-12-10 MED ORDER — ONDANSETRON HCL 4 MG/2ML IJ SOLN: 4.0000 mg | Freq: Once | INTRAMUSCULAR | Status: DC | PRN

## 2021-12-10 MED ORDER — NALOXONE HCL 0.4 MG/ML IJ SOLN: 0.4000 mg | INTRAMUSCULAR | Status: DC | PRN

## 2021-12-10 MED ORDER — MORPHINE SULFATE (PF) 0.5 MG/ML IJ SOLN
INTRAMUSCULAR | Status: DC | PRN
Start: 2021-12-10 — End: 2021-12-10
  Administered 2021-12-10: .15 mg via INTRATHECAL

## 2021-12-10 MED ORDER — LACTATED RINGERS IV SOLN: INTRAVENOUS | Status: DC

## 2021-12-10 MED ORDER — OXYTOCIN-SODIUM CHLORIDE 30-0.9 UT/500ML-% IV SOLN: 2.5000 [IU]/h | INTRAVENOUS | Status: AC

## 2021-12-10 MED ORDER — PHENYLEPHRINE HCL-NACL 20-0.9 MG/250ML-% IV SOLN
INTRAVENOUS | Status: DC | PRN
  Administered 2021-12-10: 30 ug/min via INTRAVENOUS

## 2021-12-10 MED ORDER — WITCH HAZEL-GLYCERIN EX PADS: 1.0000 "application " | MEDICATED_PAD | CUTANEOUS | Status: DC | PRN

## 2021-12-10 MED ORDER — TETANUS-DIPHTH-ACELL PERTUSSIS 5-2.5-18.5 LF-MCG/0.5 IM SUSY: 0.5000 mL | PREFILLED_SYRINGE | Freq: Once | INTRAMUSCULAR | Status: DC

## 2021-12-10 MED ORDER — CEFAZOLIN IN SODIUM CHLORIDE 3-0.9 GM/100ML-% IV SOLN
3.0000 g | INTRAVENOUS | Status: DC
  Filled 2021-12-10 (×2): qty 100

## 2021-12-10 MED ORDER — ENOXAPARIN SODIUM 40 MG/0.4ML IJ SOSY
40.0000 mg | PREFILLED_SYRINGE | INTRAMUSCULAR | Status: DC
  Administered 2021-12-11: 40 mg via SUBCUTANEOUS
  Filled 2021-12-10: qty 0.4

## 2021-12-10 MED ORDER — INSULIN GLARGINE-YFGN 100 UNIT/ML ~~LOC~~ SOLN
30.0000 [IU] | Freq: Once | SUBCUTANEOUS | Status: AC
  Administered 2021-12-10: 30 [IU] via SUBCUTANEOUS
  Filled 2021-12-10: qty 0.3

## 2021-12-10 MED ORDER — NALOXONE HCL 4 MG/10ML IJ SOLN
1.0000 ug/kg/h | INTRAVENOUS | Status: DC | PRN
  Filled 2021-12-10: qty 5

## 2021-12-10 MED ORDER — DIPHENHYDRAMINE HCL 50 MG/ML IJ SOLN
INTRAMUSCULAR | Status: AC
  Filled 2021-12-10: qty 1

## 2021-12-10 MED ORDER — HYDRALAZINE HCL 20 MG/ML IJ SOLN: 10.0000 mg | INTRAMUSCULAR | Status: DC | PRN

## 2021-12-10 MED ORDER — ACETAMINOPHEN 10 MG/ML IV SOLN
INTRAVENOUS | Status: AC
  Filled 2021-12-10: qty 100

## 2021-12-10 MED ORDER — SENNOSIDES-DOCUSATE SODIUM 8.6-50 MG PO TABS
2.0000 | ORAL_TABLET | Freq: Every day | ORAL | Status: DC
  Administered 2021-12-11 – 2021-12-12 (×2): 2 via ORAL
  Filled 2021-12-10 (×2): qty 2

## 2021-12-10 MED ORDER — PRENATAL MULTIVITAMIN CH
1.0000 | ORAL_TABLET | Freq: Every day | ORAL | Status: DC
  Administered 2021-12-11 – 2021-12-12 (×2): 1 via ORAL
  Filled 2021-12-10 (×2): qty 1

## 2021-12-10 MED ORDER — FENTANYL CITRATE (PF) 100 MCG/2ML IJ SOLN: 25.0000 ug | INTRAMUSCULAR | Status: DC | PRN

## 2021-12-10 MED ORDER — OXYCODONE-ACETAMINOPHEN 5-325 MG PO TABS
1.0000 | ORAL_TABLET | ORAL | Status: DC | PRN
  Administered 2021-12-11 – 2021-12-12 (×2): 1 via ORAL
  Filled 2021-12-10 (×2): qty 1

## 2021-12-10 MED ORDER — TRANEXAMIC ACID-NACL 1000-0.7 MG/100ML-% IV SOLN
INTRAVENOUS | Status: DC | PRN
  Administered 2021-12-10: 1000 mg via INTRAVENOUS

## 2021-12-10 MED ORDER — SODIUM CHLORIDE 0.9 % IV SOLN: INTRAVENOUS | Status: DC | PRN

## 2021-12-10 MED ORDER — FENTANYL CITRATE (PF) 100 MCG/2ML IJ SOLN
INTRAMUSCULAR | Status: AC
  Filled 2021-12-10: qty 2

## 2021-12-10 MED ORDER — MEPERIDINE HCL 25 MG/ML IJ SOLN: 6.2500 mg | INTRAMUSCULAR | Status: DC | PRN

## 2021-12-10 MED ORDER — TRANEXAMIC ACID-NACL 1000-0.7 MG/100ML-% IV SOLN: 1000.0000 mg | INTRAVENOUS | Status: DC

## 2021-12-10 MED ORDER — DEXAMETHASONE SODIUM PHOSPHATE 4 MG/ML IJ SOLN
INTRAMUSCULAR | Status: AC
Start: 2021-12-10 — End: ?
  Filled 2021-12-10: qty 1

## 2021-12-10 MED ORDER — TRANEXAMIC ACID-NACL 1000-0.7 MG/100ML-% IV SOLN
INTRAVENOUS | Status: AC
  Filled 2021-12-10: qty 100

## 2021-12-10 MED ORDER — SODIUM CHLORIDE 0.9% FLUSH: 3.0000 mL | INTRAVENOUS | Status: DC | PRN

## 2021-12-10 MED ORDER — MORPHINE SULFATE (PF) 0.5 MG/ML IJ SOLN
INTRAMUSCULAR | Status: AC
  Filled 2021-12-10: qty 10

## 2021-12-10 MED ORDER — MAGNESIUM SULFATE BOLUS VIA INFUSION
4.0000 g | Freq: Once | INTRAVENOUS | Status: AC
  Administered 2021-12-10: 4 g via INTRAVENOUS
  Filled 2021-12-10: qty 1000

## 2021-12-10 MED ORDER — OXYTOCIN-SODIUM CHLORIDE 30-0.9 UT/500ML-% IV SOLN
INTRAVENOUS | Status: DC | PRN
  Administered 2021-12-10: 30 [IU] via INTRAVENOUS

## 2021-12-10 MED ORDER — DROPERIDOL 2.5 MG/ML IJ SOLN: 0.6250 mg | Freq: Once | INTRAMUSCULAR | Status: DC | PRN

## 2021-12-10 MED ORDER — INSULIN GLARGINE-YFGN 100 UNIT/ML ~~LOC~~ SOLN
60.0000 [IU] | Freq: Every day | SUBCUTANEOUS | Status: DC
  Filled 2021-12-10: qty 0.6

## 2021-12-10 MED ORDER — SIMETHICONE 80 MG PO CHEW
80.0000 mg | CHEWABLE_TABLET | Freq: Three times a day (TID) | ORAL | Status: DC
  Administered 2021-12-11 – 2021-12-12 (×4): 80 mg via ORAL
  Filled 2021-12-10 (×4): qty 1

## 2021-12-10 MED ORDER — MAGNESIUM SULFATE 40 GM/1000ML IV SOLN: INTRAVENOUS | Status: DC | PRN

## 2021-12-10 MED ORDER — DIBUCAINE (PERIANAL) 1 % EX OINT: 1.0000 "application " | TOPICAL_OINTMENT | CUTANEOUS | Status: DC | PRN

## 2021-12-10 MED ORDER — CLINDAMYCIN PHOSPHATE 900 MG/50ML IV SOLN
INTRAVENOUS | Status: DC | PRN
  Administered 2021-12-10: 900 mg via INTRAVENOUS

## 2021-12-10 MED ORDER — ONDANSETRON HCL 4 MG/2ML IJ SOLN
INTRAMUSCULAR | Status: DC | PRN
  Administered 2021-12-10: 4 mg via INTRAVENOUS

## 2021-12-10 MED ORDER — IBUPROFEN 600 MG PO TABS
600.0000 mg | ORAL_TABLET | Freq: Four times a day (QID) | ORAL | Status: DC
  Administered 2021-12-11 – 2021-12-12 (×6): 600 mg via ORAL
  Filled 2021-12-10 (×6): qty 1

## 2021-12-10 MED ORDER — METOCLOPRAMIDE HCL 5 MG/ML IJ SOLN
10.0000 mg | Freq: Once | INTRAMUSCULAR | Status: AC
  Administered 2021-12-10: 10 mg via INTRAVENOUS
  Filled 2021-12-10: qty 2

## 2021-12-10 MED ORDER — FENTANYL CITRATE (PF) 100 MCG/2ML IJ SOLN
INTRAMUSCULAR | Status: DC | PRN
  Administered 2021-12-10: 15 ug via INTRATHECAL

## 2021-12-10 MED ORDER — LACTATED RINGERS IV SOLN: INTRAVENOUS | Status: DC | PRN

## 2021-12-10 MED ORDER — FENTANYL CITRATE (PF) 100 MCG/2ML IJ SOLN
25.0000 ug | INTRAMUSCULAR | Status: DC | PRN
  Administered 2021-12-10: 50 ug via INTRAVENOUS

## 2021-12-10 MED ORDER — SCOPOLAMINE 1 MG/3DAYS TD PT72
MEDICATED_PATCH | TRANSDERMAL | Status: DC | PRN
  Administered 2021-12-10: 1 via TRANSDERMAL

## 2021-12-10 SURGICAL SUPPLY — 31 items
CHLORAPREP W/TINT 26ML (MISCELLANEOUS) ×2 IMPLANT
CLAMP CORD UMBIL (MISCELLANEOUS) IMPLANT
DRESSING PREVENA PLUS CUSTOM (GAUZE/BANDAGES/DRESSINGS) IMPLANT
DRSG OPSITE POSTOP 4X10 (GAUZE/BANDAGES/DRESSINGS) ×2 IMPLANT
DRSG PREVENA PLUS CUSTOM (GAUZE/BANDAGES/DRESSINGS) ×2
ELECT REM PT RETURN 9FT ADLT (ELECTROSURGICAL) ×2
ELECTRODE REM PT RTRN 9FT ADLT (ELECTROSURGICAL) ×1 IMPLANT
EXTRACTOR VACUUM M CUP 4 TUBE (SUCTIONS) IMPLANT
GLOVE BIOGEL PI IND STRL 6.5 (GLOVE) ×1 IMPLANT
GLOVE BIOGEL PI IND STRL 7.0 (GLOVE) ×1 IMPLANT
GLOVE BIOGEL PI INDICATOR 6.5 (GLOVE) ×1
GLOVE BIOGEL PI INDICATOR 7.0 (GLOVE) ×1
GLOVE SURG SS PI 6.5 STRL IVOR (GLOVE) ×2 IMPLANT
GOWN STRL REUS W/TWL LRG LVL3 (GOWN DISPOSABLE) ×4 IMPLANT
KIT ABG SYR 3ML LUER SLIP (SYRINGE) IMPLANT
NDL HYPO 25X5/8 SAFETYGLIDE (NEEDLE) IMPLANT
NEEDLE HYPO 25X5/8 SAFETYGLIDE (NEEDLE) IMPLANT
NS IRRIG 1000ML POUR BTL (IV SOLUTION) ×2 IMPLANT
PACK C SECTION WH (CUSTOM PROCEDURE TRAY) ×2 IMPLANT
PAD OB MATERNITY 4.3X12.25 (PERSONAL CARE ITEMS) ×2 IMPLANT
PENCIL SMOKE EVAC W/HOLSTER (ELECTROSURGICAL) ×2 IMPLANT
RETRACTOR WND ALEXIS 25 LRG (MISCELLANEOUS) IMPLANT
RTRCTR C-SECT PINK 25CM LRG (MISCELLANEOUS) IMPLANT
RTRCTR WOUND ALEXIS 25CM LRG (MISCELLANEOUS) ×2
SUT PLAIN 0 NONE (SUTURE) IMPLANT
SUT PLAIN 2 0 XLH (SUTURE) ×1 IMPLANT
SUT VIC AB 0 CT1 36 (SUTURE) ×10 IMPLANT
SUT VIC AB 4-0 KS 27 (SUTURE) ×2 IMPLANT
TOWEL OR 17X24 6PK STRL BLUE (TOWEL DISPOSABLE) ×2 IMPLANT
TRAY FOLEY W/BAG SLVR 14FR LF (SET/KITS/TRAYS/PACK) ×2 IMPLANT
WATER STERILE IRR 1000ML POUR (IV SOLUTION) ×3 IMPLANT

## 2021-12-10 NOTE — Anesthesia Procedure Notes (Signed)
Spinal ? ?Patient location during procedure: OR ?Start time: 12/10/2021 8:15 PM ?End time: 12/10/2021 8:20 PM ?Reason for block: surgical anesthesia ?Staffing ?Performed: anesthesiologist  ?Anesthesiologist: Merlinda Frederick, MD ?Preanesthetic Checklist ?Completed: patient identified, IV checked, risks and benefits discussed, surgical consent, monitors and equipment checked, pre-op evaluation and timeout performed ?Spinal Block ?Patient position: sitting ?Prep: DuraPrep ?Patient monitoring: cardiac monitor, continuous pulse ox and blood pressure ?Approach: midline ?Location: L2-3 ?Injection technique: single-shot ?Needle ?Needle type: Pencan  ?Needle gauge: 24 G ?Needle length: 9 cm ?Assessment ?Events: CSF return ?Additional Notes ?Functioning IV was confirmed and monitors were applied. Sterile prep and drape, including hand hygiene and sterile gloves were used. The patient was positioned and the spine was prepped. The skin was anesthetized with lidocaine.  Free flow of clear CSF was obtained prior to injecting local anesthetic into the CSF.  The spinal needle aspirated freely following injection.  The needle was carefully withdrawn.  The patient tolerated the procedure well.  ? ? ? ?

## 2021-12-10 NOTE — Progress Notes (Signed)
? ?  NURSE VISIT- NST ? ?SUBJECTIVE:  ?Claire Harrison is a 26 y.o. [phone] female at [redacted]w[redacted]d, here for a NST for pregnancy complicated by T2DM: Class B, currently on Lantus and Humalog .  She reports active fetal movement, contractions: irritability vaginal bleeding: scant staining, membranes: intact. Went to [facility] last night for contractions but cervix was closed. Continues to have worsening headache over the past four days despite taking Tylenol and Fioricet.  Also having blurred vision.  ? ?OBJECTIVE:  ?BP 127/79   Pulse 74   Wt 271 lb (122.9 kg)   LMP 03/26/2021   BMI 49.57 kg/m?   ?Actively vomiting ? ?Results for orders placed or performed in visit on 12/10/21 (from the past 24 hour(s))  ?POC Urinalysis Dipstick OB  ? Collection Time: 12/10/21 10:55 AM  ?Result Value Ref Range  ? Color, UA    ? Clarity, UA    ? Glucose, UA Negative Negative  ? Bilirubin, UA    ? Ketones, UA neg   ? Spec Grav, UA    ? Blood, UA neg   ? pH, UA    ? POC,PROTEIN,UA Moderate (2+) Negative, Trace, Small (1+), Moderate (2+), Large (3+), 4+  ? Urobilinogen, UA    ? Nitrite, UA neg   ? Leukocytes, UA Negative Negative  ? Appearance    ? Odor    ? ? ?NST: FHR baseline 150 bpm, Variability: moderate, Accelerations:present, Decelerations:  Absent= Cat 1/reactive ?Toco: none  ? ?ASSESSMENT: ?[id number] at [redacted]w[redacted]d with T2DM: Class B, currently on Lantus and Humalog ?NST reactive ? ?PLAN: ?EFM strip reviewed by Dr. Despina Hidden   ?Recommendations: send to [facility]U for pre-e eval. Donette Larry, CNM notified   ? ?Claire Harrison  ?12/10/2021 ?11:09 AM ? ?

## 2021-12-10 NOTE — Op Note (Signed)
[name] ?PROCEDURE DATE: 12/10/2021 ? ?PREOPERATIVE DIAGNOSIS: Intrauterine pregnancy at  [redacted]w[redacted]d weeks gestation; previous uterine incision kerr x2 and severe preeclampsia ? ?POSTOPERATIVE DIAGNOSIS: The same ? ?PROCEDURE:     Cesarean Section ? ?SURGEON:  Dr. Mora Bellman ? ?ASSISTANT: Dr Renard Matter ? ?INDICATIONS: Claire Harrison is a 26 y.o. [phone] at [redacted]w[redacted]d scheduled for cesarean section secondary to previous uterine incision kerr x2 and severe preeclampsia.  The risks of cesarean section discussed with the patient included but were not limited to: bleeding which may require transfusion or reoperation; infection which may require antibiotics; injury to bowel, bladder, ureters or other surrounding organs; injury to the fetus; need for additional procedures including hysterectomy in the event of a life-threatening hemorrhage; placental abnormalities wth subsequent pregnancies, incisional problems, thromboembolic phenomenon and other postoperative/anesthesia complications. The patient concurred with the proposed plan, giving informed written consent for the procedure.   ? ?FINDINGS:  Viable female infant in cephalic presentation.  Apgars 9 and 10, weight, 8 pounds and 11 ounces.  Meconium stained amniotic fluid.  Intact placenta, three vessel cord.  Normal uterus, fallopian tubes and ovaries bilaterally. ? ?ANESTHESIA:    Spinal ?INTRAVENOUS FLUIDS:600 ml ?ESTIMATED BLOOD LOSS: 605 ml ?URINE OUTPUT:  200 ml ?SPECIMENS: Placenta sent to L&D ?COMPLICATIONS: None immediate ? ?PROCEDURE IN DETAIL:  The patient received intravenous antibiotics and had sequential compression devices applied to her lower extremities while in the preoperative area.  She was then taken to the operating room where anesthesia was induced and was found to be adequate. A foley catheter was placed into her bladder and attached to Jeannie Mallinger gravity. She was then placed in a dorsal supine position with a leftward tilt, and prepped and draped  in a sterile manner. After an adequate timeout was performed, a Pfannenstiel skin incision was made with scalpel and carried through to the underlying layer of fascia. The fascia was incised in the midline and this incision was extended bilaterally using the Mayo scissors. Kocher clamps were applied to the superior aspect of the fascial incision and the underlying rectus muscles were dissected off bluntly. A similar process was carried out on the inferior aspect of the facial incision. The rectus muscles were separated in the midline bluntly and the peritoneum was entered bluntly. The Alexis self-retaining retractor was introduced into the abdominal cavity. Attention was turned to the lower uterine segment where a transverse hysterotomy was made with a scalpel and extended bilaterally bluntly. The infant was successfully delivered, and cord was clamped and cut after delayed cord clamping for 1 minute. The infant was handed over to awaiting neonatology team. Uterine massage was then administered and the placenta delivered intact with three-vessel cord. The uterus was cleared of clot and debris.  The hysterotomy was closed with 0 Vicryl in a running locked fashion, and an imbricating layer was also placed with a 0 Vicryl. Overall, excellent hemostasis was noted. The pelvis copiously irrigated and cleared of all clot and debris. Hemostasis was confirmed on all surfaces.  The peritoneum and the muscles were reapproximated using 0 vicryl interrupted stitches. The fascia was then closed using 0 Vicryl in a running fashion.  The subcutaneous layer was reapproximated with plain gut and the skin was closed in a subcuticular fashion using 3.0 Vicryl. Prevena wound vac was applied over the incision. The patient tolerated the procedure well. Sponge, lap, instrument and needle counts were correct x 2. She was taken to the recovery room in stable condition.  ? ?An experienced assistant  was required given the standard of surgical  care given the complexity of the case.  This assistant was needed for exposure, dissection, suctioning, retraction, instrument exchange, assisting with delivery with administration of fundal pressure, and for overall help during the procedure. ? ? ? ?Claire Kochanski ConstantMD  ?12/10/2021 9:27 PM  ?

## 2021-12-10 NOTE — MAU Provider Note (Addendum)
?History  ?  ? ?CSN: [id number] ? ?Arrival date and time: 12/10/21 1451 ? ? Event Date/Time  ? First Provider Initiated Contact with Patient 12/10/21 1549   ?  ?26 yo F [id number] two prior cesarean sections who presents from office for evaluation with headaches and visual changes/floaters. ? ?#Headaches ?#Floaters ?9/10  ?Had them for 4 days  ?Tylenol not helping - last took yesterday afternoon ?Seeing spots since past Friday ?Emesis this morning ?History of preeclampsia with prior pregnancy ? ? ?#T2DM ?BG fasting 91 ?Bfast 2hrs PP 112 ?Last took insulin 35U  and humalog 18U last night ? ? ?Chief Complaint  ?Patient presents with  ? Emesis  ? Nausea  ? Hypertension  ? visual floaters  ? Headache  ? ?Emesis  ?Associated symptoms include headaches.  ?Hypertension ?Associated symptoms include headaches.  ?Headache  ?Associated symptoms include vomiting. Her past medical history is significant for hypertension.  ? ?OB History   ? ? Gravida  ?4  ? Para  ?3  ? Term  ?2  ? Preterm  ?1  ? AB  ?1  ? Living  ?3  ?  ? ? SAB  ?1  ? IAB  ?   ? Ectopic  ?   ? Multiple  ?0  ? Live Births  ?3  ?   ?  ?  ? ? ?Past Medical History:  ?Diagnosis Date  ? Anxiety   ? Asthma   ? Blood transfusion without reported diagnosis   ? Depression   ? Gestational diabetes   ? Pre-eclampsia   ? ? ?Past Surgical History:  ?Procedure Laterality Date  ? CESAREAN SECTION    ? 2018, 2020  ? DILATION AND CURETTAGE OF UTERUS N/A 08/01/2016  ? Procedure: SUCTION DILATATION AND CURETTAGE;  Surgeon: JJonnie Kind MD;  Location: [facility];  Service: Gynecology;  Laterality: N/A;  ? ? ?Family History  ?Problem Relation Age of Onset  ? Diabetes Mother   ? Hypertension Mother   ? Kidney failure Mother   ? Miscarriages / SKoreaMother   ? Diabetes Father   ? Diabetes Sister   ? Miscarriages / Stillbirths Sister   ? Diabetes Sister   ? Miscarriages / Stillbirths Sister   ? Scoliosis Brother   ? Heart murmur Daughter   ? Bronchitis Son   ? Colon cancer  Maternal Aunt   ? Diabetes Maternal Grandmother   ? Kidney failure Maternal Grandmother   ? ? ?Social History  ? ?Tobacco Use  ? Smoking status: Never  ? Smokeless tobacco: Never  ?Vaping Use  ? Vaping Use: Never used  ?Substance Use Topics  ? Alcohol use: No  ? Drug use: No  ? ? ?Allergies:  ?Allergies  ?Allergen Reactions  ? Penicillins Anaphylaxis  ? Bee Venom Hives  ? Mango Flavor Hives and Swelling  ? Peanut-Containing Drug Products   ? Latex Rash  ? ? ?Medications Prior to Admission  ?Medication Sig Dispense Refill Last Dose  ? aspirin 81 MG EC tablet Take 2 tablets (162 mg total) by mouth daily. Swallow whole. 180 tablet 2 12/09/2021  ? Doxylamine-Pyridoxine (DICLEGIS) 10-10 MG TBEC 2 tabs q hs, if sx persist add 1 tab q am on day 3, if sx persist add 1 tab q afternoon on day 4 100 tablet 6 Past Week  ? HUMALOG KWIKPEN 100 UNIT/ML KwikPen 18 units with each meal 3 mL 11 12/09/2021  ? insulin glargine (LANTUS) 100 unit/mL SOPN Inject 35  Units into the skin 2 (two) times daily. 15 mL 11 12/09/2021  ? ondansetron (ZOFRAN ODT) 8 MG disintegrating tablet Take 1 tablet (8 mg total) by mouth every 8 (eight) hours as needed for nausea or vomiting. 20 tablet 0 Past Month  ? Prenatal Vit-Fe Fumarate-FA (PRENATAL VITAMIN PO) Take by mouth daily.    12/09/2021  ? ACCU-CHEK GUIDE test strip USE 1 STRIP TO CHECK GLUCOSE 4 TIMES DAILY AS  INSTRUCTED 150 each 5   ? Accu-Chek Softclix Lancets lancets 100 each by Other route 4 (four) times daily. Use as instructed 100 each 12   ? acetaminophen (TYLENOL) 325 MG tablet Take 650 mg by mouth every 6 (six) hours as needed.     ? ATROVENT HFA 17 MCG/ACT inhaler Inhale into the lungs.     ? Blood Glucose Monitoring Suppl (ACCU-CHEK GUIDE) w/Device KIT USE AS DIRECTED 1 kit 0   ? Blood Pressure Monitor MISC For regular home bp monitoring during pregnancy 1 each 0   ? butalbital-acetaminophen-caffeine (FIORICET) 50-325-40 MG tablet Take 1-2 tablets by mouth every 6 (six) hours as needed  for headache. (Patient not taking: Reported on 12/10/2021) 20 tablet 0   ? GLOBAL EASE INJECT PEN NEEDLES 32G X 4 MM MISC      ? insulin glargine (LANTUS) 100 unit/mL SOPN Inject 35 Units into the skin 2 (two) times daily. 15 mL 11   ? ? ?Review of Systems  ?Gastrointestinal:  Positive for vomiting.  ?Neurological:  Positive for headaches.  ?Physical Exam  ? ?Blood pressure 130/86, pulse 74, temperature 97.6 ?F (36.4 ?C), temperature source Oral, resp. rate (!) 23, height _0  (1.575 m), weight 123.1 kg, last menstrual period 03/26/2021, SpO2 97 %, unknown if currently breastfeeding. ? ?Physical Exam ?Vitals and nursing note reviewed.  ?HENT:  ?   Head: Normocephalic.  ?Cardiovascular:  ?   Rate and Rhythm: Normal rate.  ?Pulmonary:  ?   Effort: Pulmonary effort is normal.  ?Abdominal:  ?   Palpations: Abdomen is soft.  ?Neurological:  ?   Mental Status: She is alert.  ?   Cranial Nerves: No cranial nerve deficit.  ?   Sensory: No sensory deficit.  ?Psychiatric:     ?   Mood and Affect: Mood normal.  ? ? ?MAU Course  ?Procedures ? ?NST initially not reactive with no accels and at times minimal variability. Also initially tachycardic to 170s.  Improved to reactive after second bolus ? ?MDM ?26 yo F [id number] at 25w0dwho presents from office with headaches and vision changes in setting of hx of pre-eclampsia with prior pregnancies and p:c of 0.3 in this pregnancy. BP initially normotensive but then increased to 140s/90s. P:C now uptrended to 1.4 therefore and headache mildly improved with Tylenol, Decadron but then worsened to 10/10. Therefore likely preE with SF will treat with Mg and plan for delivery by repeat cesarean section (since 2 prior CS) ? ?Assessment and Plan  ?PreE with SF (HA and vision changes) ?- admit  ?- Plan for delivery via repeat CS ?- Discussed plan with Dr. CElly Modenawho is in agreement ?- Consented for procedure, orders placed, to OR when ready ?- See H&P for further details ? ?ARenard Matter?12/10/2021, 11:26 PM  ?

## 2021-12-10 NOTE — Discharge Summary (Signed)
? ?  Postpartum Discharge Summary ? ?Date of Service updated ? ?   ?Patient Name: Claire Harrison ?DOB: [birth year] ?MRN: [id number] ? ?Date of admission: 12/10/2021 ?Delivery date:12/10/2021  ?Delivering provider: CONSTANT, PEGGY  ?Date of discharge: 12/12/2021 ? ?Admitting diagnosis: Encounter for supervision of high risk pregnancy in third trimester, antepartum [O09.93] ?Intrauterine pregnancy: [redacted]w[redacted]d    ?Secondary diagnosis:  Principal Problem: ?  Encounter for supervision of high risk pregnancy in third trimester, antepartum ?Active Problems: ?  Rubella non-immune status, antepartum ?  Type 2 diabetes mellitus (HFloyd ?  Supervision of high-risk pregnancy ?  History of postpartum hemorrhage ?  Previous cesarean section ?  Pre-eclampsia ?  Status post repeat low transverse cesarean section ? ?Additional problems: None    ?Discharge diagnosis: Term Pregnancy Delivered, Preeclampsia (severe), and Type 2 DM                                              ?Post partum procedures: Magnesium, Nexplanon insertion ?Augmentation: N/A ?Complications: None ? ?Hospital course: Sceduled C/S   26y.o. yo [id number] 330w0das admitted to the hospital 12/10/2021 for pre-eclampsia with severe features (HA and blurry vision) and went for a  cesarean section with the following indication: repeat after two prior cesarean sections .Delivery details are as follows:  ?Membrane Rupture Time/Date: 8:47 PM ,12/10/2021   ?Delivery Method:C-Section, Low Vertical  ?Details of operation can be found in separate operative note.   ? ?Patient had an uncomplicated postpartum course outside of her preeclampsia with severe features. She had magnesium for 24 hours and was started on Procardia and lasix for her blood pressure. She has a PrChief Operating Officeror wound care.  She had a Nexplanon placed on POD2. Her DM was well controlled on a reduced amount of Lantus from her pre-pregnancy dose. On POD2 she desires discharge so she could spend more time with the baby who was  admitted for respiratory issues.  She is ambulating, tolerating a regular diet, passing flatus, and urinating well. Patient is discharged home in stable condition on  12/12/21 ?       ?Newborn Data: ?Birth date:12/10/2021  ?Birth time:8:48 PM  ?Gender:Female  ?Living status:Living  ?Apgars:9 ,10  ?[id number]    ? ?Magnesium Sulfate received: Yes: Seizure prophylaxis ?BMZ received: No ?Rhophylac:N/A ?MMR:N/A ?T-DaP:Given prenatally ?Flu: Yes ?Transfusion:No ? ?Physical exam  ?Vitals:  ? 12/11/21 2342 12/12/21 0426 12/12/21 0757 12/12/21 1237  ?BP: (!) 147/52 122/66 139/84 (!) 155/87  ?Pulse: 72 76 80 79  ?Resp: _0 ?Temp: 98.6 ?F (37 ?C) 98.8 ?F (37.1 ?C) 97.9 ?F (36.6 ?C) 99.2 ?F (37.3 ?C)  ?TempSrc: Oral Oral Oral Oral  ?SpO2: 99%  99% 100%  ?Weight:      ?Height:      ? ?General: alert, cooperative, and no distress ?Lochia: appropriate ?Uterine Fundus: firm ?Incision: Prevena over incision, suction well applied ?DVT Evaluation: Calf/Ankle edema is present ?Labs: ?Lab Results  ?Component Value Date  ? WBC 19.1 (H) 12/11/2021  ? HGB 10.6 (L) 12/11/2021  ? HCT 31.3 (L) 12/11/2021  ? MCV 78.4 (L) 12/11/2021  ? PLT 359 12/11/2021  ? ?CMP Latest Ref Rng & Units 12/10/2021  ?Glucose 70 - 99 mg/dL 221(H)  ?BUN 6 - 20 mg/dL 11  ?Creatinine 0.44 - 1.00 mg/dL 0.63  ?Sodium 135 - 145 mmol/L 136  ?  Potassium 3.5 - 5.1 mmol/L 3.9  ?Chloride 98 - 111 mmol/L 107  ?CO2 22 - 32 mmol/L 21(L)  ?Calcium 8.9 - 10.3 mg/dL 9.1  ?Total Protein 6.5 - 8.1 g/dL 6.0(L)  ?Total Bilirubin 0.3 - 1.2 mg/dL <0.1(L)  ?Alkaline Phos 38 - 126 U/L 115  ?AST 15 - 41 U/L 18  ?ALT 0 - 44 U/L 15  ? ?Edinburgh Score: ?No flowsheet data found. ? ? ?After visit meds:  ?Allergies as of 12/12/2021   ? ?   Reactions  ? Penicillins Anaphylaxis  ? Bee Venom Hives  ? Mango Flavor Hives, Swelling  ? Peanut-containing Drug Products   ? Latex Rash  ? ?  ? ?  ?Medication List  ?  ? ?STOP taking these medications   ? ?Accu-Chek Guide test strip ?Generic drug:  glucose blood ?  ?Accu-Chek Guide w/Device Kit ?  ?Accu-Chek Softclix Lancets lancets ?  ?acetaminophen 325 MG tablet ?Commonly known as: TYLENOL ?  ?aspirin 81 MG EC tablet ?  ?Blood Pressure Monitor Misc ?  ?butalbital-acetaminophen-caffeine 50-325-40 MG tablet ?Commonly known as: FIORICET ?  ?Doxylamine-Pyridoxine 10-10 MG Tbec ?Commonly known as: Diclegis ?  ?ondansetron 8 MG disintegrating tablet ?Commonly known as: Zofran ODT ?  ? ?  ? ?TAKE these medications   ? ?Atrovent HFA 17 MCG/ACT inhaler ?Generic drug: ipratropium ?Inhale into the lungs. ?  ?ibuprofen 600 MG tablet ?Commonly known as: ADVIL ?Take 1 tablet (600 mg total) by mouth every 6 (six) hours. ?  ?insulin glargine 100 UNIT/ML Solostar Pen ?Commonly known as: LANTUS ?Inject 40 Units into the skin at bedtime. ?What changed:  ?medication strength ?how much to take ?when to take this ?Another medication with the same name was removed. Continue taking this medication, and follow the directions you see here. ?  ?insulin lispro 100 UNIT/ML KwikPen ?Commonly known as: HumaLOG KwikPen ?Inject 2 Units into the skin 3 (three) times daily. ?What changed:  ?how much to take ?how to take this ?when to take this ?additional instructions ?  ?NIFEdipine 30 MG 24 hr tablet ?Commonly known as: ADALAT CC ?Take 1 tablet (30 mg total) by mouth daily. ?Start taking on: December 13, 2021 ?  ?oxyCODONE-acetaminophen 5-325 MG tablet ?Commonly known as: PERCOCET/ROXICET ?Take 1-2 tablets by mouth every 4 (four) hours as needed for moderate pain. ?  ?Pentips 32G X 4 MM Misc ?Generic drug: Insulin Pen Needle ?Use as directed up to 4 times daily with lantus and humalog ?What changed: additional instructions ?  ?PRENATAL VITAMIN PO ?Take by mouth daily. ?  ? ?  ? ?  ?  ? ? ?  ?Discharge Care Instructions  ?(From admission, onward)  ?  ? ? ?  ? ?  Start     Ordered  ? 12/12/21 0000  Discharge wound care:       ?Comments: If dressing is present, remove after 7 days from surgery  ?  12/12/21 1226  ? ?  ?  ? ?  ? ? ? ?Discharge home in stable condition ?Infant Feeding: Breast ?Infant Disposition:NICU ?Discharge instruction: per After Visit Summary and Postpartum booklet. ?Activity: Advance as tolerated. Pelvic rest for 6 weeks.  ?Diet: carb modified diet and low salt diet ?Future Appointments: ?Future Appointments  ?Date Time Provider [place]  ?12/20/2021  9:50 AM Janyth Pupa, DO [facility]  ?01/15/2022 11:10 AM Myrtis Ser, CNM [facility]  ? ?Follow up Visit: ? Follow-up Information   ? ? [facility]  Follow up in 1 week(s).   ?Specialty: Obstetrics and Gynecology ?Contact information: ?[address] [place] ?[phone] ? ?  ?  ? ?  ?  ? ?  ? ?Message sent to [facility] by Dr. Cy Blamer on 3/14 ? ?Please schedule this patient for a In person postpartum visit in 4 weeks with the following provider: MD. ?Additional Postpartum F/U:Incision check 1 week and BP check 1 week  ?High risk pregnancy complicated by:  G3OV and PreE with SF ?Delivery mode:  C-Section, Low Vertical  ?Anticipated Birth Control:  PP Nexplanon placed today - see note for details.  ? ? ?12/12/2021 ?Radene Gunning, MD ? ? ? ?

## 2021-12-10 NOTE — H&P (Signed)
OBSTETRIC ADMISSION HISTORY AND PHYSICAL ? ?Claire Harrison is a 26 y.o. female [phone] with IUP at 18w0dby LMP presenting for headaches and vision changes and now with confirmed pre-Eclampsia with severe features. Has history of two prior cesarean sections. She reports +FMs, No LOF, no VB, and RUQ pain.  She plans on breast and formula feeding. She request Nexplanon for birth control. ?[facility]  ? ?Dating: By LMP --->  Estimated Date of Delivery: 12/31/21 ? ?Sono:   ?@[redacted]w[redacted]d , CWD, normal anatomy, cephalic presentation, anterior placental lie, 3106g, 97% EFW with AC 99%ile ? ? ?Prenatal History/Complications:  ?Type 2 diabetes on Lantus and Humalog ?Pre-eclampsia with severe features ?Two prior cesarean sections ?History of PPH with prior pregnancy ? ? ?Past Medical History: ?Past Medical History:  ?Diagnosis Date  ? Anxiety   ? Asthma   ? Blood transfusion without reported diagnosis   ? Depression   ? Gestational diabetes   ? Pre-eclampsia   ? ? ?Past Surgical History: ?Past Surgical History:  ?Procedure Laterality Date  ? CESAREAN SECTION    ? 2018, 2020  ? DILATION AND CURETTAGE OF UTERUS N/A 08/01/2016  ? Procedure: SUCTION DILATATION AND CURETTAGE;  Surgeon: JJonnie Kind MD;  Location: [facility];  Service: Gynecology;  Laterality: N/A;  ? ? ?Obstetrical History: ?OB History   ? ? Gravida  ?4  ? Para  ?2  ? Term  ?1  ? Preterm  ?1  ? AB  ?1  ? Living  ?2  ?  ? ? SAB  ?1  ? IAB  ?   ? Ectopic  ?   ? Multiple  ?   ? Live Births  ?2  ?   ?  ?  ? ? ?Social History ?Social History  ? ?Socioeconomic History  ? Marital status: Married  ?  Spouse name: Not on file  ? Number of children: Not on file  ? Years of education: Not on file  ? Highest education level: Not on file  ?Occupational History  ? Not on file  ?Tobacco Use  ? Smoking status: Never  ? Smokeless tobacco: Never  ?Vaping Use  ? Vaping Use: Never used  ?Substance and Sexual Activity  ? Alcohol use: No  ? Drug use: No  ?  Sexual activity: Yes  ?  Birth control/protection: None  ?Other Topics Concern  ? Not on file  ?Social History Narrative  ? Not on file  ? ?Social Determinants of Health  ? ?Financial Resource Strain: Medium Risk  ? Difficulty of Paying Living Expenses: Somewhat hard  ?Food Insecurity: Food Insecurity Present  ? Worried About RCharity fundraiserin the Last Year: Sometimes true  ? Ran Out of Food in the Last Year: Sometimes true  ?Transportation Needs: No Transportation Needs  ? Lack of Transportation (Medical): No  ? Lack of Transportation (Non-Medical): No  ?Physical Activity: Insufficiently Active  ? Days of Exercise per Week: 2 days  ? Minutes of Exercise per Session: 20 min  ?Stress: No Stress Concern Present  ? Feeling of Stress : Only a little  ?Social Connections: Socially Integrated  ? Frequency of Communication with Friends and Family: Three times a week  ? Frequency of Social Gatherings with Friends and Family: More than three times a week  ? Attends Religious Services: 1 to 4 times per year  ? Active Member of Clubs or Organizations: No  ? Attends CArchivist  Meetings: 1 to 4 times per year  ? Marital Status: Married  ? ? ?Family History: ?Family History  ?Problem Relation Age of Onset  ? Diabetes Mother   ? Hypertension Mother   ? Kidney failure Mother   ? Miscarriages / [place] Mother   ? Diabetes Father   ? Diabetes Sister   ? Miscarriages / Stillbirths Sister   ? Diabetes Sister   ? Miscarriages / Stillbirths Sister   ? Scoliosis Brother   ? Heart murmur Daughter   ? Bronchitis Son   ? Colon cancer Maternal Aunt   ? Diabetes Maternal Grandmother   ? Kidney failure Maternal Grandmother   ? ? ?Allergies: ?Allergies  ?Allergen Reactions  ? Penicillins Anaphylaxis  ? Bee Venom Hives  ? Mango Flavor Hives and Swelling  ? Peanut-Containing Drug Products   ? Latex Rash  ? ? ?Medications Prior to Admission  ?Medication Sig Dispense Refill Last Dose  ? aspirin 81 MG EC tablet Take 2 tablets (162  mg total) by mouth daily. Swallow whole. 180 tablet 2 12/09/2021  ? Doxylamine-Pyridoxine (DICLEGIS) 10-10 MG TBEC 2 tabs q hs, if sx persist add 1 tab q am on day 3, if sx persist add 1 tab q afternoon on day 4 100 tablet 6 Past Week  ? HUMALOG KWIKPEN 100 UNIT/ML KwikPen 18 units with each meal 3 mL 11 12/09/2021  ? insulin glargine (LANTUS) 100 unit/mL SOPN Inject 35 Units into the skin 2 (two) times daily. 15 mL 11 12/09/2021  ? ondansetron (ZOFRAN ODT) 8 MG disintegrating tablet Take 1 tablet (8 mg total) by mouth every 8 (eight) hours as needed for nausea or vomiting. 20 tablet 0 Past Month  ? Prenatal Vit-Fe Fumarate-FA (PRENATAL VITAMIN PO) Take by mouth daily.    12/09/2021  ? ACCU-CHEK GUIDE test strip USE 1 STRIP TO CHECK GLUCOSE 4 TIMES DAILY AS  INSTRUCTED 150 each 5   ? Accu-Chek Softclix Lancets lancets 100 each by Other route 4 (four) times daily. Use as instructed 100 each 12   ? acetaminophen (TYLENOL) 325 MG tablet Take 650 mg by mouth every 6 (six) hours as needed.     ? ATROVENT HFA 17 MCG/ACT inhaler Inhale into the lungs.     ? Blood Glucose Monitoring Suppl (ACCU-CHEK GUIDE) w/Device KIT USE AS DIRECTED 1 kit 0   ? Blood Pressure Monitor MISC For regular home bp monitoring during pregnancy 1 each 0   ? butalbital-acetaminophen-caffeine (FIORICET) 50-325-40 MG tablet Take 1-2 tablets by mouth every 6 (six) hours as needed for headache. (Patient not taking: Reported on 12/10/2021) 20 tablet 0   ? GLOBAL EASE INJECT PEN NEEDLES 32G X 4 MM MISC      ? insulin glargine (LANTUS) 100 unit/mL SOPN Inject 35 Units into the skin 2 (two) times daily. 15 mL 11   ? ? ? ?Review of Systems  ? ?All systems reviewed and negative except as stated in HPI ? ?Blood pressure 137/76, pulse 87, temperature 98.2 ?F (36.8 ?C), temperature source Oral, resp. rate 17, height 5' 2"  (1.575 m), weight 123.1 kg, last menstrual period 03/26/2021, SpO2 98 %. ?General appearance: alert, resting in bed ?Lungs: clear to auscultation  bilaterally ?Heart: regular rate and rhythm ?Abdomen: soft, non-tender; bowel sounds normal ?Extremities: Homans sign is negative, no sign of DVT ?Fetal monitoringBaseline: 150 bpm, Variability: Good {> 6 bpm), Accelerations: Reactive, and Decelerations: Absent ?Uterine activity none-irregular ?  ? ? ?Prenatal labs: ?ABO, Rh: B/Positive/-- (10/03 1359) ?  Antibody: Negative (10/03 1359) ?Rubella: 3.88 (10/03 1359) ?RPR: Non Reactive (10/03 1359)  ?HBsAg: Negative (10/03 1359)  ?HIV: Non Reactive (10/03 1359)  ?GBS: --[place] (03/10 1400)  ?Type 2 DM ?Genetic screening  LR NIPS, negative carrier screening ([employer] 14) ?Anatomy US normal with suspected macrosomia ? ?Prenatal Transfer Tool  ?Maternal Diabetes: Yes:  Diabetes Type:  Insulin/Medication controlled ?Genetic Screening: Normal ?Maternal Ultrasounds/Referrals: Normal ?Fetal Ultrasounds or other Referrals:  Fetal echo ?Maternal Substance Abuse:  No ?Significant Maternal Medications:  Meds include: Other: Insulin ?Significant Maternal Lab Results: Other:  ? ?Results for orders placed or performed during the hospital encounter of 12/10/21 (from the past 24 hour(s))  ?CBC  ? Collection Time: 12/10/21  4:16 PM  ?Result Value Ref Range  ? WBC 10.9 (H) 4.0 - 10.5 K/uL  ? RBC 4.32 3.87 - 5.11 MIL/uL  ? Hemoglobin 11.4 (L) 12.0 - 15.0 g/dL  ? HCT 34.1 (L) 36.0 - 46.0 %  ? MCV 78.9 (L) 80.0 - 100.0 fL  ? MCH 26.4 26.0 - 34.0 pg  ? MCHC 33.4 30.0 - 36.0 g/dL  ? RDW 13.8 11.5 - 15.5 %  ? Platelets 352 150 - 400 K/uL  ? nRBC 0.0 0.0 - 0.2 %  ?Comprehensive metabolic panel  ? Collection Time: 12/10/21  4:16 PM  ?Result Value Ref Range  ? Sodium 136 135 - 145 mmol/L  ? Potassium 3.9 3.5 - 5.1 mmol/L  ? Chloride 107 98 - 111 mmol/L  ? CO2 21 (L) 22 - 32 mmol/L  ? Glucose, Bld 221 (H) 70 - 99 mg/dL  ? BUN 11 6 - 20 mg/dL  ? Creatinine, Ser 0.63 0.44 - 1.00 mg/dL  ? Calcium 9.1 8.9 - 10.3 mg/dL  ? Total Protein 6.0 (L) 6.5 - 8.1 g/dL  ? Albumin 2.3 (L) 3.5 - 5.0 g/dL  ? AST 18  15 - 41 U/L  ? ALT 15 0 - 44 U/L  ? Alkaline Phosphatase 115 38 - 126 U/L  ? Total Bilirubin <0.1 (L) 0.3 - 1.2 mg/dL  ? GFR, Estimated >60 >60 mL/min  ? Anion gap 8 5 - 15  ?Urinalysis, Routine w r

## 2021-12-10 NOTE — Anesthesia Preprocedure Evaluation (Addendum)
Anesthesia Evaluation  ?Patient identified by MRN, date of birth, ID band ?Patient awake ? ? ? ?Reviewed: ?Allergy & Precautions, NPO status , Patient's Chart, lab work & pertinent test results ? ?Airway ?Mallampati: III ? ?TM Distance: >3 FB ?Neck ROM: Full ? ? ? Dental ?no notable dental hx. ? ?  ?Pulmonary ?asthma ,  ?  ?Pulmonary exam normal ?breath sounds clear to auscultation ? ? ? ? ? ? Cardiovascular ?hypertension (preeclampsia), Pt. on medications ?Normal cardiovascular exam ?Rhythm:Regular Rate:Normal ? ? ?  ?Neuro/Psych ?PSYCHIATRIC DISORDERS Anxiety Depression negative neurological ROS ?   ? GI/Hepatic ?negative GI ROS, Neg liver ROS,   ?Endo/Other  ?diabetes, GestationalMorbid obesity ? Renal/GU ?negative Renal ROS  ?negative genitourinary ?  ?Musculoskeletal ?negative musculoskeletal ROS ?(+)  ? Abdominal ?(+) + obese,   ?Peds ?negative pediatric ROS ?(+)  Hematology ? ?(+) Blood dyscrasia, anemia ,   ?Anesthesia Other Findings ? ? Reproductive/Obstetrics ?(+) Pregnancy ? ?  ? ? ? ? ? ? ? ? ? ? ? ? ? ?  ?  ? ? ? ? ? ? ? ? ?Anesthesia Physical ?Anesthesia Plan ? ?ASA: 4 ? ?Anesthesia Plan: Spinal  ? ?Post-op Pain Management: Tylenol PO (pre-op)*  ? ?Induction: Intravenous ? ?PONV Risk Score and Plan: 2 and Treatment may vary due to age or medical condition ? ?Airway Management Planned: Natural Airway and Simple Face Mask ? ?Additional Equipment:  ? ?Intra-op Plan:  ? ?Post-operative Plan:  ? ?Informed Consent: I have reviewed the patients History and Physical, chart, labs and discussed the procedure including the risks, benefits and alternatives for the proposed anesthesia with the patient or authorized representative who has indicated his/her understanding and acceptance.  ? ? ? ? ? ?Plan Discussed with: Anesthesiologist and CRNA ? ?Anesthesia Plan Comments: (Patient last ate at 1pm ([employer]). OB Dr. Jolayne Panther would like to go at 8pm not 9pm given severity of  patient's headache. Discussed with patient the main risk is aspiration when not waiting the appropriate NPO time which could like to respiratory complications. She expressed understanding and agreed to proceed with C-section.  Will plan for spinal. GETA/RSI as backup plan. Tanna Furry, MD  ?)  ? ? ? ? ? ?Anesthesia Quick Evaluation ? ?

## 2021-12-10 NOTE — Transfer of Care (Signed)
Immediate Anesthesia Transfer of Care Note ? ?Patient: Claire Harrison ? ?Procedure(s) Performed: CESAREAN SECTION ? ?Patient Location: PACU ? ?Anesthesia Type:Spinal ? ?Level of Consciousness: awake, alert  and oriented ? ?Airway & Oxygen Therapy: Patient Spontanous Breathing ? ?Post-op Assessment: Report given to RN and Post -op Vital signs reviewed and stable ? ?Post vital signs: Reviewed and stable ? ?Last Vitals:  ?Vitals Value Taken Time  ?BP 144/89 12/10/21 2200  ?Temp    ?Pulse 70 12/10/21 2204  ?Resp 16 12/10/21 2204  ?SpO2 97 % 12/10/21 2204  ?Vitals shown include unvalidated device data. ? ?Last Pain:  ?Vitals:  ? 12/10/21 1521  ?TempSrc: Oral  ?PainSc:   ?   ? ?  ? ?Complications: No notable events documented. ?

## 2021-12-10 NOTE — MAU Note (Addendum)
...  Claire Harrison is a 26 y.o. at [redacted]w[redacted]d here in MAU reporting: Sent from the office. HA's and nausea since this past Friday. She states she is also experiencing visual floaters but states she has been experiencing those since the begninning of her second trimester. Endorses lower back pain that began last night and she thought she was in labor so she went to [facility] via EMS to be seen. Had a negative amnisure. Cervix was closed. Threw up once in the office today. No VB or LOF. +FM. ? ?Last PO: ?1300 today - sandwich, fries, drink. ? ?Pain score:  ?10/10 HA ?8/10 lower back  ? ?FHT: 165 ?Lab orders placed from triage:  UA ? ?

## 2021-12-11 ENCOUNTER — Encounter (HOSPITAL_COMMUNITY): Payer: Self-pay | Admitting: Obstetrics and Gynecology

## 2021-12-11 LAB — CBC
HCT: 31.3 % — ABNORMAL LOW (ref 36.0–46.0)
Hemoglobin: 10.6 g/dL — ABNORMAL LOW (ref 12.0–15.0)
MCH: 26.6 pg (ref 26.0–34.0)
MCHC: 33.9 g/dL (ref 30.0–36.0)
MCV: 78.4 fL — ABNORMAL LOW (ref 80.0–100.0)
Platelets: 359 10*3/uL (ref 150–400)
RBC: 3.99 MIL/uL (ref 3.87–5.11)
RDW: 13.6 % (ref 11.5–15.5)
WBC: 19.1 10*3/uL — ABNORMAL HIGH (ref 4.0–10.5)
nRBC: 0 % (ref 0.0–0.2)

## 2021-12-11 LAB — RPR: RPR Ser Ql: NONREACTIVE

## 2021-12-11 LAB — GLUCOSE, CAPILLARY
Glucose-Capillary: 162 mg/dL — ABNORMAL HIGH (ref 70–99)
Glucose-Capillary: 165 mg/dL — ABNORMAL HIGH (ref 70–99)
Glucose-Capillary: 212 mg/dL — ABNORMAL HIGH (ref 70–99)
Glucose-Capillary: 224 mg/dL — ABNORMAL HIGH (ref 70–99)

## 2021-12-11 MED ORDER — INSULIN GLARGINE-YFGN 100 UNIT/ML ~~LOC~~ SOLN
50.0000 [IU] | Freq: Every day | SUBCUTANEOUS | Status: DC
  Administered 2021-12-11: 50 [IU] via SUBCUTANEOUS
  Filled 2021-12-11 (×2): qty 0.5

## 2021-12-11 MED ORDER — INSULIN ASPART 100 UNIT/ML IJ SOLN
0.0000 [IU] | Freq: Three times a day (TID) | INTRAMUSCULAR | Status: DC
  Administered 2021-12-11 – 2021-12-12 (×3): 2 [IU] via SUBCUTANEOUS

## 2021-12-11 MED ORDER — ACETAMINOPHEN 325 MG PO TABS
650.0000 mg | ORAL_TABLET | Freq: Four times a day (QID) | ORAL | Status: DC | PRN
  Administered 2021-12-11: 650 mg via ORAL
  Filled 2021-12-11 (×2): qty 2

## 2021-12-11 MED ORDER — SODIUM CHLORIDE 0.9 % IV SOLN
25.0000 mg | Freq: Three times a day (TID) | INTRAVENOUS | Status: DC | PRN
  Administered 2021-12-11: 25 mg via INTRAVENOUS
  Filled 2021-12-11: qty 1

## 2021-12-11 NOTE — Progress Notes (Signed)
Inpatient Diabetes Program Recommendations ? ?AACE/ADA: New Consensus Statement on Inpatient Glycemic Control (2015) ? ?Target Ranges:  Prepandial:   less than 140 mg/dL ?     Peak postprandial:   less than 180 mg/dL (1-2 hours) ?     Critically ill patients:  140 - 180 mg/dL  ? ?Lab Results  ?Component Value Date  ? GLUCAP 212 (H) 12/11/2021  ? HGBA1C 9.3 09/08/2018  ? ? ?Review of Glycemic Control ? Latest Reference Range & Units 12/10/21 22:41 12/11/21 02:02  ?Glucose-Capillary 70 - 99 mg/dL 186 (H) 212 (H)  ?(H): Data is abnormally high ? ?Diabetes history: DM2 ?Outpatient Diabetes medications: Lantus 35 units BID, Humalog 11 units TID ?Current orders for Inpatient glycemic control: Semglee 60 units QHS ? ?Inpatient Diabetes Program Recommendations:   ? ?As patient has delivered, please change CBG frequency to ac/hs and add 0-9 units TID if appropriate.  Recommend decreasing Semglee to 20 units QHS (1/3 of pregnancy dose).   ? ?Spoke with patient at bedside.  Her Pre-pregnancy dose of Lantus was 60 units QD.  Her pregnancy dose was 35 units BID.  She was given 30 units of Semglee yesterday.  She states she felt low this morning and ate pretzels and drank something with sugar.  Spoke with Dr. Damita Dunnings; she is changing CBGs to ac/hs, adding novolog 0-9 units TID and Semglee 50 units QHS.  If CBGs run low today would recommend decreasing basal to 1/3 which would be 20 units.  .   ? ?Will continue to follow while inpatient. ? ?Thank you, ?Reche Dixon, MSN, RN ?Diabetes Coordinator ?Inpatient Diabetes Program ?[phone] (team pager from 8a-5p) ? ? ? ? ? ?

## 2021-12-11 NOTE — Anesthesia Postprocedure Evaluation (Signed)
Anesthesia Post Note ? ?Patient: Claire Harrison ? ?Procedure(s) Performed: CESAREAN SECTION ? ?  ? ?Patient location during evaluation: Mother Baby ?Anesthesia Type: Spinal ?Level of consciousness: oriented and awake and alert ?Pain management: pain level controlled ?Vital Signs Assessment: post-procedure vital signs reviewed and stable ?Respiratory status: spontaneous breathing and respiratory function stable ?Cardiovascular status: blood pressure returned to baseline and stable ?Postop Assessment: no headache, no backache, no apparent nausea or vomiting and able to ambulate ?Anesthetic complications: no ? ? ?No notable events documented. ? ?Last Vitals:  ?Vitals:  ? 12/11/21 0135 12/11/21 0225  ?BP: (!) 122/48 138/64  ?Pulse: 64 93  ?Resp:    ?Temp:    ?SpO2:    ?  ?Last Pain:  ?Vitals:  ? 12/10/21 2335  ?TempSrc: Oral  ?PainSc: 3   ? ?Pain Goal:   ? ?  ?  ?  ?  ?  ?  ?  ? ?Merlinda Frederick ? ? ? ? ?

## 2021-12-11 NOTE — Lactation Note (Signed)
This note was copied from a baby's chart. ?Lactation Consultation Note ? ?Patient Name: Claire Harrison ?Today's Date: 12/11/2021 ?Reason for consult: Initial assessment;NICU baby;Early term 37-38.6wks;Maternal endocrine disorder;Other (Comment) (LGA) ?Age:28 hours ? ?Visited with mom of 20 hours old ETI NICU female, she's a P3 and experienced BF. Mom reported she started pumping last night and she has been able to get droplets of colostrum, praised her for her efforts.  ? ?Reviewed lactogenesis II, pumping schedule, pumping log, feeding cues, expectations and normal ETI behavior. Mom's goal is to eventually take baby to breast as she did with her other children.  ? ?Maternal Data ?Has patient been taught Hand Expression?: Yes ?Does the patient have breastfeeding experience prior to this delivery?: Yes ?How long did the patient breastfeed?: 1-2 years ? ?Feeding ?Mother's Current Feeding Choice: Breast Milk and Formula ? ?Lactation Tools Discussed/Used ?Tools: Pump;Flanges ?Flange Size: 24 ?Breast pump type: Double-Electric Breast Pump ?Pump Education: Setup, frequency, and cleaning;Milk Storage ?Reason for Pumping: ETI in NICU ?Pumping frequency: q 3 hours ? ?Interventions ?Interventions: Breast feeding basics reviewed;Hand express;DEBP;Education;"The NICU and Your Baby" book;LC Services brochure ? ?Plan of care ?Encouraged mom to continue pumping every 3 hours, at least 8 pumping sessions/24 hours ?Hand expression and breast massage were also encouraged prior pumping ? ?FOB present and supportive, he speaks Spanish (mom is bilingual) this LC also provided all the education to FOB in [place]. All questions and concerns answered, family to call NICU LC PRN. ? ?Discharge ?Pump: DEBP;Personal (DEBP at home (doesn't remember the name)) ? ?Consult Status ?Consult Status: Follow-up ?Date: 12/11/21 ?Follow-up type: In-patient ? ? ?Courteny Egler S Jaelynn Currier ?12/11/2021, 5:52 PM ? ? ? ?

## 2021-12-11 NOTE — Progress Notes (Signed)
MOB was referred for history of depression/anxiety. ?* Referral screened out by Clinical Social Worker because none of the following criteria appear to apply: ?~ History of anxiety/depression during this pregnancy, or of post-partum depression following prior delivery. Per chart review MOB is established with an outpatient therapist. No concerns were noted in OB record.  ?~ Diagnosis of anxiety and/or depression within last 3 years ?OR ?* MOB's symptoms currently being treated with medication and/or therapy. ? ?Please contact the Clinical Social Worker if needs arise, by [facility] request, or if MOB scores greater than 9/yes to question 10 on Edinburgh Postpartum Depression Screen.  ? ?Laurey Arrow, MSW, LCSW ?Clinical Social Work ?[phone] ? ?

## 2021-12-11 NOTE — Progress Notes (Signed)
Subjective: ?Postpartum Day 1: Cesarean Delivery ? ?Patient reports incisional pain and tolerating PO.  Her pain is controlled. She is on Magnesium but overall feels better since delivery. She reports some issues with latch for the baby. She confirms she would like to have the baby circumcised.  ? ?Objective: ?Vital signs in last 24 hours: ?Temp:  [97.4 ?F (36.3 ?C)-98.3 ?F (36.8 ?C)] 98.3 ?F (36.8 ?C) (03/15 1140) ?Pulse Rate:  [64-103] 67 (03/15 0750) ?Resp:  [13-23] 18 (03/15 1000) ?BP: (95-151)/(48-100) 95/55 (03/15 0750) ?SpO2:  [93 %-100 %] 93 % (03/15 0750) ?Weight:  [123.1 kg] 123.1 kg (03/14 1516) ? ?Physical Exam:  ?General: alert, cooperative, and no distress ?Lochia: appropriate ?Uterine Fundus: not palpable ?Incision: Prevena in place with good suction ?DVT Evaluation: Calf/Ankle edema is present. ? ?Recent Labs  ?  12/10/21 ?1616 12/11/21 ?0451  ?HGB 11.4* 10.6*  ?HCT 34.1* 31.3*  ? ? ?Assessment/Plan: ?Status post Cesarean section. Doing well postoperatively.  ?- HgB normal postop - no indication for iron ?- Rh positive, Rubella immune ?- s/p tdap and flu shot during prenatal care ?- D/C foley once of magnesium.  ?- Will place Nexplanon prior to discharge ? ?DM2 ?- Prepregnancy insulin is 60 units of lantus. Will do 50 units per discussion with Zerita Boers, RN. Added in SSI and changed CBGs.  ?- Will continue to adjust while in patient ? ?PreE with Severe features ?- Continue Mag for 24 hours ?- Admission PreE labs completely normal. No indication for repeat at this time.  ? ?4. Desires circumcision for baby ?- Parent desires circumcision for this female infant.  Circumcision procedure details discussed, risks and benefits of procedure were also discussed.  The benefits include but are not limited to: reduction in the rates of urinary tract infection (UTI), penile cancer, sexually transmitted infections including HIV, penile inflammatory and retractile disorders.  Circumcision also helps obtain  better and easier hygiene of the penis.  Risks include but are not limited to: bleeding, infection, injury of glans which may lead to penile deformity or urinary tract issues or Urology intervention, unsatisfactory cosmetic appearance and other potential complications related to the procedure.  It was emphasized that this is an elective procedure.  ?- Will hold on this time due to latch issues ? ? ?Milas Hock ?12/11/2021, 11:52 AM ? ? ?

## 2021-12-11 NOTE — Progress Notes (Signed)
Inpatient Diabetes Program Recommendations ? ?AACE/ADA: New Consensus Statement on Inpatient Glycemic Control (2015) ? ?Target Ranges:  Prepandial:   less than 140 mg/dL ?     Peak postprandial:   less than 180 mg/dL (1-2 hours) ?     Critically ill patients:  140 - 180 mg/dL  ? ?Lab Results  ?Component Value Date  ? GLUCAP 212 (H) 12/11/2021  ? HGBA1C 9.3 09/08/2018  ? ? ?Review of Glycemic Control ? Latest Reference Range & Units 12/10/21 16:31 12/10/21 22:41 12/11/21 02:02  ?Glucose-Capillary 70 - 99 mg/dL 628 (H) 366 (H) 294 (H)  ?(H): Data is abnormally high ? ?Diabetes history: DM2 ?Outpatient Diabetes medications: Lantus 60 units QHS, Novolog 11 units TID ?Current orders for Inpatient glycemic control: Semglee 60 units QHS ? ?Inpatient Diabetes Program Recommendations:   ? ?Novolog 0-9 units TID with meals ? ?Noted Semglee 30 units administered last evening; 212 mg/dL this am.  MD increased Semglee to 60 units tonight (home dose). ? ?Will continue to follow while inpatient. ? ?Thank you, ?Dulce Sellar, MSN, RN ?Diabetes Coordinator ?Inpatient Diabetes Program ?[phone] (team pager from 8a-5p) ? ? ? ? ?

## 2021-12-12 ENCOUNTER — Other Ambulatory Visit (HOSPITAL_COMMUNITY): Payer: Self-pay

## 2021-12-12 ENCOUNTER — Ambulatory Visit: Payer: Self-pay

## 2021-12-12 DIAGNOSIS — Z30017 Encounter for initial prescription of implantable subdermal contraceptive: Secondary | ICD-10-CM

## 2021-12-12 LAB — GLUCOSE, CAPILLARY
Glucose-Capillary: 114 mg/dL — ABNORMAL HIGH (ref 70–99)
Glucose-Capillary: 67 mg/dL — ABNORMAL LOW (ref 70–99)
Glucose-Capillary: 183 mg/dL — ABNORMAL HIGH (ref 70–99)
Glucose-Capillary: 109 mg/dL — ABNORMAL HIGH (ref 70–99)

## 2021-12-12 MED ORDER — NIFEDIPINE ER 30 MG PO TB24
30.0000 mg | ORAL_TABLET | Freq: Every day | ORAL | 0 refills | Status: DC
  Filled 2021-12-12: qty 30, 30d supply, fill #0

## 2021-12-12 MED ORDER — LIDOCAINE HCL 1 % IJ SOLN
0.0000 mL | Freq: Once | INTRAMUSCULAR | Status: AC | PRN
  Administered 2021-12-12: 3 mL via INTRADERMAL
  Filled 2021-12-12: qty 20

## 2021-12-12 MED ORDER — PENTIPS 32G X 4 MM MISC
11 refills | Status: DC
  Filled 2021-12-12: qty 100, 30d supply, fill #0

## 2021-12-12 MED ORDER — NIFEDIPINE ER OSMOTIC RELEASE 30 MG PO TB24
30.0000 mg | ORAL_TABLET | Freq: Every day | ORAL | Status: DC
  Administered 2021-12-12: 30 mg via ORAL
  Filled 2021-12-12: qty 1

## 2021-12-12 MED ORDER — OXYCODONE-ACETAMINOPHEN 5-325 MG PO TABS
1.0000 | ORAL_TABLET | ORAL | 0 refills | Status: DC | PRN
  Filled 2021-12-12: qty 20, 2d supply, fill #0

## 2021-12-12 MED ORDER — INSULIN GLARGINE 100 UNIT/ML SOLOSTAR PEN
40.0000 [IU] | PEN_INJECTOR | Freq: Every day | SUBCUTANEOUS | 1 refills | Status: DC
  Filled 2021-12-12: qty 3, 7d supply, fill #0
  Filled 2021-12-12: qty 12, 30d supply, fill #0

## 2021-12-12 MED ORDER — IBUPROFEN 600 MG PO TABS
600.0000 mg | ORAL_TABLET | Freq: Four times a day (QID) | ORAL | 0 refills | Status: DC
  Filled 2021-12-12: qty 60, 15d supply, fill #0

## 2021-12-12 MED ORDER — INSULIN LISPRO (1 UNIT DIAL) 100 UNIT/ML (KWIKPEN)
2.0000 [IU] | PEN_INJECTOR | Freq: Three times a day (TID) | SUBCUTANEOUS | 1 refills | Status: DC
  Filled 2021-12-12: qty 3, 50d supply, fill #0

## 2021-12-12 MED ORDER — ETONOGESTREL 68 MG ~~LOC~~ IMPL
68.0000 mg | DRUG_IMPLANT | Freq: Once | SUBCUTANEOUS | Status: AC
  Administered 2021-12-12: 68 mg via SUBCUTANEOUS
  Filled 2021-12-12: qty 1

## 2021-12-12 MED ORDER — FUROSEMIDE 40 MG PO TABS
20.0000 mg | ORAL_TABLET | Freq: Every day | ORAL | Status: DC
  Administered 2021-12-12: 20 mg via ORAL
  Filled 2021-12-12: qty 1

## 2021-12-12 NOTE — Lactation Note (Deleted)
This note was copied from a baby's chart. ? ?NICU Lactation Consultation Note ? ?Patient Name: Claire Harrison ?Today's Date: 12/12/2021 ?Age:28 hours ? ?Subjective ?Reason for consult: Follow-up assessment; Mother's request; NICU baby ? ?Lactation followed up with Claire Harrison. I was accompanied by Claire Harrison. We discussed her feeding goals, and she would like to exclusively breast feed. However, she would like baby to be able to take a bottle, particularly during times when she is not at the NICU during the feeding times.  ? ?Baby latched with rhythmic suckling sequences and good flanging. She eventually became sleepy and released the breast. Baby received gavage feeding during this attempt. ? ?I recommended that Claire Harrison offer the breast at scheduled feeding times anytime she is able, and I encouraged her to pump both breasts instead of one at a time. One barrier to this was holding the flanges on. I provided a hands free pumping top to address this barrier. ? ?Objective ?Infant data: ?Mother's Current Feeding Choice: Breast Milk and Formula ? ?Infant feeding assessment ?Scale for Readiness: 1 ?Scale for Quality: 3 (pacing q6-8 sucks required) ? ?Maternal data: ?[id number]  ?C-Section, Low Vertical ?No data recorded ?Current breast feeding challenges:: NICU; separation ? ?No data recorded ?Does the patient have breastfeeding experience prior to this delivery?: Yes ?How long did the patient breastfeed?: 1.5-2 years ? ?Pumping frequency: rec q2-3 hours during the day and q3-4 hours during the night ?Pumped volume: 4 mL ?Flange Size: 24 (recommended trying a 27 next time) ? ?No data recorded ? ?Pump: DEBP, Personal ? ?Assessment ?Infant: ?LATCH Score: 8 ? ?No data recorded ? ?Maternal: ?Milk volume: Normal ? ? ?Intervention/Plan ?Interventions: Breast feeding basics reviewed; Assisted with latch; Skin to skin; Hand express; Adjust position; Support pillows; Education ? ?Tools: Hands-free pumping top ?Pump  Education: Setup, frequency, and cleaning ? ?Plan: ?Consult Status: Follow-up ? ?NICU Follow-up type: New admission follow up; Assist with IDF-1 (Mother to pre-pump before breastfeeding); Verify onset of copious milk; Verify absence of engorgement ? ? ? ?Claire Harrison ?12/12/2021, 12:34 PM ?

## 2021-12-12 NOTE — Procedures (Signed)
GYNECOLOGY PROCEDURE NOTE ? ?Claire Harrison is a 26 y.o. [phone] for Nexplanon insertion. ? ?Nexplanon Insertion Procedure ?Patient identified, informed consent performed, consent signed.   Patient does understand that irregular bleeding is a very common side effect of this medication. She was advised to have backup contraception for one week after placement. Pregnancy test in clinic today was negative.  Appropriate time out taken.   ? ?Patient's left arm was prepped and draped in the usual sterile fashion. The ruler used to measure and mark insertion area.  Patient was prepped with alcohol swab and then injected with 3 ml of 1% lidocaine.  She was prepped with betadine, Nexplanon removed from packaging,  Device confirmed in needle, then inserted full length of needle and withdrawn per handbook instructions. Nexplanon was able to palpated in the patient's arm; patient palpated the insert herself. There was minimal blood loss.  Patient insertion site covered with guaze and a pressure bandage to reduce any bruising.   ? ?The patient tolerated the procedure well and was given post procedure instructions.  ? ?Milas Hock, MD, FACOG ?Obstetrician Heritage manager, Faculty Practice ?Center for [employer], [facility] ? ? ? ? ?

## 2021-12-12 NOTE — Progress Notes (Signed)
Hypoglycemic Event ? ?CBG: 67 @ 0654 ? ?Treatment: Pt ate a snack she had on hand ? ?Symptoms: Sweaty; pt requested glucose check d/t feeling "low" ? ?Follow-up CBG: Time:0714 CBG Result:114 ? ?Possible Reasons for Event: Changes in medication/insulin needs after delivery ? ? ?Claire Harrison ? ? ?

## 2021-12-12 NOTE — Lactation Note (Signed)
This note was copied from a baby's chart. ? ?NICU Lactation Consultation Note ? ?Patient Name: Claire Harrison ?Today's Date: 12/12/2021 ?Age:28 hours ? ? ?Subjective ?Reason for consult: Follow-up assessment; Mother's request; Primapara; NICU baby; Early term 37-38.6wks ? ?Lactation reported to the room to assist with breast feeding. Baby Claire Harrison began cueing around 1330. I called [facility] and asked if Claire Harrison was ready. She arrived shortly after with the OB RN, and we prepared Claire Harrison to latch. We placed him STS on the right breast in cross cradle hold. Baby latched immediately with rhythmic suckling sequences. After a few minutes, he released the breast and began to cry. He would not re-latch. ? ?RN started the gavage, and we provided baby his pacifier with some EBM on it. He began to calm down, and we latched him to the right breast again. I reinforced his latch with some EBM provided via syringe at the breast. He remained calm and eventually stopped breast feeding to rest. ? ?We discussed that Claire Harrison may benefit from feeding with early cues to avoid frustration. Claire Harrison is 42 hours postpartum, and her milk has not yet transitioned. I encouraged her to continue to put him to breast first, and continue to pump q2-3 hours during the day and 3-4 hours during the night.  ? ?Baby is also feeding via bottle. I recommended that Claire Harrison feed baby at the breast when she is with baby to allow him time to develop his skills and differentiate breast from bottle. ? ?Objective ?Infant data: ?Mother's Current Feeding Choice: Breast Milk and Donor Milk ? ?Infant feeding assessment ?Scale for Readiness: 1 ?Scale for Quality: 3 ? ?Maternal data: ?[id number]  ?C-Section, Low Vertical ? ?Current breast feeding challenges:: NICU; maternal medications ? ?Does the patient have breastfeeding experience prior to this delivery?: Yes ?How long did the patient breastfeed?: 1.5-2 years ? ?Pumping frequency: rec q2-3  hours ?Pumped volume: 5 mL ?Flange Size: 24 (recommended trying a 27 next time) ? ? ?Pump: Personal ? ?Assessment ?Infant: ?LATCH Score: 6 ? ? ?Maternal: ?Milk volume: Normal ? ? ?Intervention/Plan ?Interventions: Breast feeding basics reviewed; Assisted with latch; Skin to skin; Hand express; Adjust position; Support pillows; Education ? ?Tools: Pump ?Pump Education: Setup, frequency, and cleaning ? ?Plan: ?Consult Status: Follow-up ? ?NICU Follow-up type: New admission follow up; Verify onset of copious milk; Verify absence of engorgement ? ? ? ?Claire Harrison ?12/12/2021, 3:04 PM ?

## 2021-12-12 NOTE — Lactation Note (Signed)
This note was copied from a baby's chart. ? ?NICU Lactation Consultation Note ? ?Patient Name: Claire Harrison ?Today's Date: 12/12/2021 ?Age:28 hours ? ? ?Subjective ?Reason for consult: Follow-up assessment ? ?Lactation followed up and assisted with pumping. Mom noticing breast changes today. Endorses uterine contractions with pumping. Milk WNL. ? ?OB entered room during consult and started discharge process. Mom will be receiving an up to 5 day course of lasix in addition other other medications. She was also on magnesium therapy. She also is receiving nexplanon today.  ? ?We discussed impacts of medications on her milk volume. Claire Harrison states that she has strong (extra) milk volume with previous two children. I encouraged her to pump frequently and to allow extra time for milk volume to increase. ? ?Lactation to follow up today at 1400 or 1700 to assist with breast feeding.  ? ?Objective ?Infant data: ?Mother's Current Feeding Choice: Breast Milk and Formula ? ?Infant feeding assessment ?Scale for Readiness: 2 ? ?  ?Maternal data: ?[id number]  ?C-Section, Low Vertical ? ?Current breast feeding challenges:: NICU; maternal medications that might impact milk supply ? ?Does the patient have breastfeeding experience prior to this delivery?: Yes ?How long did the patient breastfeed?: 1.5-2 years ? ?Pumping frequency: rec 8+ times a day q2-3 hours ?Pumped volume: 4 mL ?Flange Size: 24 (recommended trying a 27 next time) ? ? ?Pump: Personal (insurance pump, per mom) ? ?Assessment ? ?Maternal: ?Milk volume: Normal ? ? ?Intervention/Plan ?Interventions: Breast feeding basics reviewed; DEBP; Education ? ?Tools: Pump ?Pump Education: Setup, frequency, and cleaning; Milk Storage ? ?Plan: ?Consult Status: Follow-up ? ?NICU Follow-up type: New admission follow up; Assist with IDF-1 (Mother to pre-pump before breastfeeding) ? ? ? ?Walker Shadow ?12/12/2021, 10:48 AM ?

## 2021-12-12 NOTE — Progress Notes (Signed)
Inpatient Diabetes Program Recommendations ? ?AACE/ADA: New Consensus Statement on Inpatient Glycemic Control (2015) ? ?Target Ranges:  Prepandial:   less than 140 mg/dL ?     Peak postprandial:   less than 180 mg/dL (1-2 hours) ?     Critically ill patients:  140 - 180 mg/dL  ? ?Lab Results  ?Component Value Date  ? GLUCAP 183 (H) 12/12/2021  ? HGBA1C 9.3 09/08/2018  ? ? ?Review of Glycemic Control ? Latest Reference Range & Units 12/12/21 06:54 12/12/21 07:13 12/12/21 07:54  ?Glucose-Capillary 70 - 99 mg/dL 67 (L) 114 (H) 183 (H)  ?(L): Data is abnormally low ?(H): Data is abnormally high ? ?Diabetes history: DM2 ?Outpatient Diabetes medications: Lantus 60 units QHS, Novolog 11 units TID ?Current orders for Inpatient glycemic control: Semglee 50 units QHS, Novolog 0-9 units TID ? ?Mild hypoglycemia this am of 67 mg/dL.  Spoke with Dr. Damita Dunnings; she is planning to reduce Semglee to 40 units QHS.   ? ?Will continue to follow while inpatient. ? ?Thank you, ?Reche Dixon, MSN, RN ?Diabetes Coordinator ?Inpatient Diabetes Program ?[phone] (team pager from 8a-5p) ? ? ? ? ? ?

## 2021-12-13 ENCOUNTER — Encounter: Payer: Self-pay | Admitting: Women's Health

## 2021-12-13 ENCOUNTER — Telehealth: Payer: Self-pay | Admitting: Obstetrics & Gynecology

## 2021-12-13 ENCOUNTER — Ambulatory Visit: Payer: Self-pay

## 2021-12-13 ENCOUNTER — Other Ambulatory Visit: Payer: Self-pay | Admitting: Women's Health

## 2021-12-13 ENCOUNTER — Other Ambulatory Visit: Payer: Medicaid Other | Admitting: Obstetrics & Gynecology

## 2021-12-13 ENCOUNTER — Other Ambulatory Visit (HOSPITAL_COMMUNITY): Payer: Self-pay

## 2021-12-13 MED ORDER — FUROSEMIDE 20 MG PO TABS: 20.0000 mg | ORAL_TABLET | Freq: Every day | ORAL | 0 refills | Status: DC

## 2021-12-13 MED ORDER — FUROSEMIDE 20 MG PO TABS
20.0000 mg | ORAL_TABLET | Freq: Every day | ORAL | 0 refills | Status: DC
  Filled 2021-12-13: qty 2, 2d supply, fill #0

## 2021-12-13 NOTE — Lactation Note (Addendum)
This note was copied from a baby's chart. ?Lactation Consultation Note ? ?Patient Name: Claire Harrison ?Today's Date: 12/13/2021 ?Reason for consult: Maternal endocrine disorder;Follow-up assessment;Mother's request;Primapara;1st time breastfeeding;Early term 37-38.6wks;Other (Comment);NICU baby (LGA) ?Age:28 hours ? ?Visited with mom twice at 3:45 pm and for the 5 pm feeding. Mom voiced that pumping is going well, although she hasn't been pumping consistently. She complained of sore nipples and breast pain, noticed that she hasn't been fully emptying the breast; no S/S of engorgement upon examination but her breast are filling in and causing discomfort. LC provided ice packs and instructed her use them prior pumping to relieve pain/discomfort. ? ?Explained to mom the importance of consistent pumping to protect her supply, she voiced understanding. Assisted with latch for the 5 pm feeding, SLP Raquel Sarna requested lactation to F/U with mom, baby is also having challenges with bottle feeding. Baby awake and alert, took him STS to mother's left breast first (also tried on the right one) but he wouldn't latch, only a few sucks (see LATCH score). LC had to stop this attempt because baby would not start fussing and crying, left mom and baby doing STS care. ? ?Reviewed normal ETI behavior, feeding cues, sore nipples, engorgement prevention/treatment and pump settings.  ? ?Maternal Data ? Mom's supply has increased and is now WNL although she's not pumping consistently ? ?Feeding ?Mother's Current Feeding Choice: Breast Milk and Donor Milk ?Nipple Type: Nfant Extra Slow Flow (gold) ? ?LATCH Score ?Latch: Too sleepy or reluctant, no latch achieved, no sucking elicited. (will suck on a gloved finger on and off, but not at the breast) ? ?Audible Swallowing: None ? ?Type of Nipple: Everted at rest and after stimulation ? ?Comfort (Breast/Nipple): Filling, red/small blisters or bruises, mild/mod discomfort (mild discomfort per  mom) ? ?Hold (Positioning): Assistance needed to correctly position infant at breast and maintain latch. ? ?LATCH Score: 4 ? ?Lactation Tools Discussed/Used ?Tools: Pump;Flanges ?Flange Size: 24;27 ?Breast pump type: Double-Electric Breast Pump ?Pump Education: Setup, frequency, and cleaning;Milk Storage ?Reason for Pumping: ETI in NICU ?Pumping frequency: 4-5 times/24 hours ?Pumped volume: 60 mL ? ?Interventions ?Interventions: Breast feeding basics reviewed;DEBP;Education, Ice ? ?Plan of care ?Encouraged mom to start pumping consistently every 3 hours, at least 8 pumping sessions/24 hours ?She'll start using coconut/olive oil prior pumping and ice her breasts as needed ?She'll continue taking baby to breast strictly on feeding cues, baby may benefit from a NS on the next feeding attempt ?  ?No other support person at this time. All questions and concerns answered, family to call NICU LC PRN. ?  ?Discharge ?Discharge Education: Engorgement and breast care ?Pump: DEBP;Personal ? ?Consult Status ?Consult Status: Follow-up ?Date: 12/13/21 ?Follow-up type: In-patient ? ? ?Quinterious Walraven S Ollen Rao ?12/13/2021, 5:20 PM ? ? ? ?

## 2021-12-13 NOTE — Telephone Encounter (Signed)
Patient calling wanting to know if she was suppose to get a scription  for furosemide(lasix) for the fluid that she has . There was not script sent to pharmacy. Wanting to know if can be sent to [facility] pharmacy or call her  ?

## 2021-12-13 NOTE — Telephone Encounter (Signed)
Pt thought that she was supposed to have lasix sent in but it wasn't at the pharmacy for her to pick up. If she is supposed to have it would like provider to send in for her.  ?

## 2021-12-14 ENCOUNTER — Ambulatory Visit: Payer: Self-pay

## 2021-12-14 LAB — BPAM RBC
Blood Product Expiration Date: 202303252359
Blood Product Expiration Date: 202303282359
ISSUE DATE / TIME: 202303060809
ISSUE DATE / TIME: 202303060809
Unit Type and Rh: 7300
Unit Type and Rh: 7300

## 2021-12-14 LAB — TYPE AND SCREEN
ABO/RH(D): B POS
Antibody Screen: NEGATIVE
Unit division: 0
Unit division: 0

## 2021-12-14 NOTE — Lactation Note (Signed)
This note was copied from a baby's chart. ?Lactation Consultation Note ? ?Patient Name: Claire Harrison ?Today's Date: 12/14/2021 ?Reason for consult: Follow-up assessment;Breastfeeding assistance;Mother's request ?Age:28 days ? ?Mom called for assistance for the 5 pm feeding, baby already awake and crying when entered the room. LC tried to latch baby onto the right breast with and without a NS # 20 without any success, baby kept crying, he was very fussy. Mom voiced she'd like to try the other side because the left one is the side baby normally "prefers". Baby able to latch after numerous attempts but kept slipping off the breast (see LATCH score). Let NICU RN Pamala Duffel know to gavage the full feeding at this time.  ? ?Feeding ?Mother's Current Feeding Choice: Breast Milk ? ?LATCH Score ?Latch: Repeated attempts needed to sustain latch, nipple held in mouth throughout feeding, stimulation needed to elicit sucking reflex. (baby kept coming off the breast every few seconds, required constant repositioning throughout the 20 minutes he spent at the breast, disorganized sucking pattern and reflexes) ? ?Audible Swallowing: A few with stimulation (only 2 in the beginning of the feeding, NNS was the predominant sucking pattern during this event) ? ?Type of Nipple: Everted at rest and after stimulation ? ?Comfort (Breast/Nipple): Soft / non-tender ? ?Hold (Positioning): Assistance needed to correctly position infant at breast and maintain latch. (minimal assistance needed) ? ?LATCH Score: 7 ? ?Lactation Tools Discussed/Used ?Tools: Pump;Flanges ?Flange Size: 24;27 ?Breast pump type: Double-Electric Breast Pump ?Pump Education: Setup, frequency, and cleaning;Milk Storage ?Reason for Pumping: ETI in NICU ?Pumping frequency: 7-8 times/24 hours ?Pumped volume: 60 mL ? ?Interventions ?Interventions: Breast feeding basics reviewed;Assisted with latch;Skin to skin;Breast massage;Hand express;Breast compression;Adjust  position;Support pillows ? ?Plan of care ?Encouraged mom to continue pumping consistently every 3 hours, at least 8 pumping sessions/24 hours ?She'll start using coconut/olive oil prior pumping and ice her breasts as needed ?She'll continue taking baby to breast strictly on feeding cues, and will do a self breast exam to "feel" her breasts before feeding and afterwards ?  ?No other support person at this time. All questions and concerns answered, family to call NICU LC PRN. ? ?Discharge ?Pump: DEBP;Personal ? ?Consult Status ?Consult Status: Follow-up ?Date: 12/14/21 ?Follow-up type: In-patient ? ? ?Giavonni Cizek S Chancellor Vanderloop ?12/14/2021, 5:35 PM ? ? ? ?

## 2021-12-14 NOTE — Lactation Note (Signed)
This note was copied from a baby's chart. ?Lactation Consultation Note ? ?Patient Name: Claire Harrison ?Today's Date: 12/14/2021 ?Reason for consult: Follow-up assessment;Mother's request;Breastfeeding assistance;Early term 37-38.6wks;NICU baby;Maternal endocrine disorder ?Age:28 days ? ?Visited with mom of 67 hours old ETI NICU female, she requested a feeding assist. Noticed that baby is on the 8-11-2-5 schedule but mom was told she could take baby to breast in between feedings. Explained to mom that even if that were the case baby is not ready to feed at this time, he was asleep and would not wake up with mild stimulation; he was on the bili blanket, currently undergoing phototherapy. ? ?Offered assistance for the 5 pm feeding if baby is ready; NICU RN to page LC if baby wakes up for the next feeding. Mom is aware that babies with hyperbilirubinemia are not the best breastfeeders and that we can always try again tomorrow if baby is not quite ready today.  ? ?Mom voiced that her breasts feel better today and that the ice packs have helped. She's still feeling some "milk lumps" from time to time but it's not as bad as yesterday.  ? ?Maternal Data ? Mom's supply is WNL ? ?Feeding ?Mother's Current Feeding Choice: Breast Milk ? ?Lactation Tools Discussed/Used ?Tools: Pump;Flanges ?Flange Size: 24;27 ?Breast pump type: Double-Electric Breast Pump ?Pump Education: Setup, frequency, and cleaning;Milk Storage ?Reason for Pumping: ETI in NICU ?Pumping frequency: 7-8 times/24 hours ?Pumped volume: 60 mL ? ?Interventions ?Interventions: Breast feeding basics reviewed;DEBP;Education;Infant Driven Feeding Algorithm education ? ?Plan of care ?Encouraged mom to continue pumping consistently every 3 hours, at least 8 pumping sessions/24 hours ?She'll start using coconut/olive oil prior pumping and ice her breasts as needed ?She'll continue taking baby to breast strictly on feeding cues, will try a NS # 20 on the next feeding  attempt at 5 pm if baby is still struggling at the breast ?  ?No other support person at this time. All questions and concerns answered, family to call NICU LC PRN. ? ?Discharge ?Pump: DEBP;Personal ? ?Consult Status ?Consult Status: Follow-up ?Date: 12/14/21 ?Follow-up type: In-patient ? ? ?Claire Harrison S Claire Harrison ?12/14/2021, 3:57 PM ? ? ? ?

## 2021-12-15 ENCOUNTER — Ambulatory Visit: Payer: Self-pay

## 2021-12-15 NOTE — Lactation Note (Signed)
This note was copied from a baby's chart. ?Lactation Consultation Note ? ?Patient Name: Claire Harrison ?Today's Date: 12/15/2021 ?Reason for consult: Follow-up assessment;Other (Comment) (SLP request) ?Age:28 days ? ?Lactation followed up with Ms. Lemmons after speaking with Irving Burton, SLP. Baby Tiegs is ad lib today. When I entered, she just finished bottle feeding. I asked her to call for lactation at the next feeding session. She verbalized understanding. ? ?Feeding ?Mother's Current Feeding Choice: Breast Milk ? ?Lactation Tools Discussed/Used ?Breast pump type: Double-Electric Breast Pump ?Pump Education: Setup, frequency, and cleaning ?Reason for Pumping: NICU ?Pumping frequency: post pumping ?Pumped volume: 60 mL (45-75 mls) ? ? ?Consult Status ?Consult Status: Follow-up ?Date: 12/15/21 ?Follow-up type: In-patient ? ? ? ?[name] ?12/15/2021, 1:42 PM ? ? ? ?

## 2021-12-15 NOTE — Lactation Note (Signed)
This note was copied from a baby's chart. ? ?NICU Lactation Consultation Note ? ?Patient Name: Claire Harrison ?Today's Date: 12/15/2021 ?Age:28 days ? ?Lactation followed up with Claire Harrison and observed baby at the breast. I noted non-nutritive suckling and sleeping at the breast. When he released, he became agitated. I provided education on "look-fors" for nutritive suckling patterns. Claire Harrison, SLP, joined consult to also add education. ? ?We discussed a plan to limit breast feeding to 10 minutes and then to supplement via bottle following. Mom is interested in transitioning more to the breast exclusively, and we discussed this a long term goal coupled with monitoring, assistance, and OP support after discharge. I encouraged her to continue to post pump the breasts for 15-20 minutes (both breasts). ? ?Subjective ?Reason for consult: Follow-up assessment; NICU baby ? ?Objective ?Infant data: ?Mother's Current Feeding Choice: Breast Milk ? ?Infant feeding assessment ?Scale for Readiness: 1 ?Scale for Quality: 2 ? ? ?  ?Maternal data: ?[id number]  ?C-Section, Low Vertical ? ?Pumping frequency: post pumping ?Pumped volume: 60 mL (45-75 mls) ?Flange Size: 24; 27 ? ? ?Pump: DEBP, Personal ? ?Assessment ?Infant: ?LATCH Score: 7 ? ?Feeding Status: Ad lib ? ? ?Maternal: ?Milk volume: Normal ? ? ?Intervention/Plan ?Interventions: Breast feeding basics reviewed; Assisted with latch; Education ? ?Tools: Pump; Flanges ?Pump Education: Setup, frequency, and cleaning ? ?Plan: ?Consult Status: Follow-up ? ?NICU Follow-up type: Assist with IDF-2 (Mother does not need to pre-pump before breastfeeding) ? ? ? ?Claire Harrison ?12/15/2021, 4:05 PM ?

## 2021-12-16 ENCOUNTER — Inpatient Hospital Stay (HOSPITAL_COMMUNITY)
Admission: AD | Admit: 2021-12-16 | Discharge: 2021-12-16 | Disposition: A | Discharge status: Home / Self Care | Payer: Medicaid Other | Attending: Obstetrics & Gynecology | Admitting: Obstetrics & Gynecology

## 2021-12-16 ENCOUNTER — Other Ambulatory Visit: Payer: Self-pay

## 2021-12-16 DIAGNOSIS — Z4889 Encounter for other specified surgical aftercare: Secondary | ICD-10-CM | POA: Insufficient documentation

## 2021-12-16 NOTE — MAU Note (Signed)
Claire Harrison is a 26 y.o. is Post Op Day 6  here in MAU reporting: concerns that her wound vac is not working properly. Site appears intact without bleeding or drainage. ? ? ?Vitals:  ? 12/16/21 0519  ?BP: (!) 144/81  ?Pulse: 80  ?Resp: 17  ?Temp: 98.1 ?F (36.7 ?C)  ?SpO2: 100%  ?   ?   ?

## 2021-12-16 NOTE — MAU Note (Signed)
Pt stated she had to return to NICU to feed her baby and she will return when she is finished.  Off unit. ?

## 2021-12-16 NOTE — MAU Note (Signed)
First call LD called - report given to resident. Dr.Daas finishing delivery and will call when available ?

## 2021-12-16 NOTE — MAU Note (Signed)
Pt returned from NICU. Paulina Fusi CNM in to evaluate pt. ?

## 2021-12-16 NOTE — MAU Provider Note (Signed)
[name] is a 26 y.o. [phone] ? At 10 days s/p RLTCS here with concerns about the sound her wound vac is making. Patient reports that it stopped making its sucking sound and now the sound is less constant. She denies any other complaints. She was initially seen in triage and then left to go to the NICU to pump. She then came back to MAU 30 minutes later. Due to the low acuity of patient's complaint, she was seen and evaluated in triage.  ? ? ?Patient Vitals for the past 24 hrs: ? BP Temp Temp src Pulse Resp SpO2 Height Weight  ?12/16/21 0519 (!) 144/81 98.1 ?F (36.7 ?C) Oral 80 17 100 % 5\' 2"  (1.575 m) 111.5 kg  ? ? ?Prevena dressing is clean, dry, intact with working suction. There are no warning light indicators turned on.  ?1. Encounter for post surgical wound check   ? ?Reassured patient that prevena appears to be working; no signs of infection, of poor suction, low battery or other mechanical problems.  ?Keep appt for wound check on Friday; she can call clinic for RN visit if she is concerned about prevena   ? ?Saturday  ?

## 2021-12-17 ENCOUNTER — Ambulatory Visit: Payer: Self-pay

## 2021-12-17 ENCOUNTER — Other Ambulatory Visit: Payer: Medicaid Other

## 2021-12-17 NOTE — Lactation Note (Signed)
This note was copied from a baby's chart. ?Lactation Consultation Note ? ?Patient Name: Claire Harrison ?Today's Date: 12/17/2021 ?Reason for consult: NICU baby;Maternal discharge;Maternal endocrine disorder;Follow-up assessment;Difficult latch;Early term 37-38.6wks;Other (Comment) (LGA) ?Age:28 days ? ?Visited with mother before discharge. Baby is latching and mother is pumping consistently. Mom is pleased with her supply and believes that the body armour drink is helping with her supply. She plans on continuing bringing baby to breast at home and feeding with bottles after when needed. Reviewed discharge education and LC OP resources.  ? ?Maternal Data ?  ?Supply WNL.  ? ?Feeding ?Mother's Current Feeding Choice: Breast Milk ?Nipple Type: Nfant Slow Flow (purple) ? ? ?Lactation Tools Discussed/Used ?Tools: Coconut oil;Pump ?Breast pump type: Double-Electric Breast Pump ?Pump Education: Setup, frequency, and cleaning;Milk Storage ?Reason for Pumping: NICU ?Pumping frequency: at least 8-10x in 24 hours ?Pumped volume: 60 mL (60-80ml) ? ?Interventions ?Interventions: Coconut oil;Education;Breast feeding basics reviewed;Breast massage;LC Services brochure ? ?Plan of Care: ?Continue pumping consistently every 3 hours or after feedings at the breast ?Follow up with local [facility] office  ? ?Discharge ?Discharge Education: Engorgement and breast care;Outpatient recommendation ?Pump: DEBP;Personal ? ?Consult Status ?Consult Status: Complete ?Date: 12/17/21 ?Follow-up type: Call as needed ? ? ? ?Claire Harrison ?12/17/2021, 3:20 PM ? ? ? ?

## 2021-12-19 ENCOUNTER — Telehealth: Payer: Self-pay

## 2021-12-19 NOTE — Telephone Encounter (Signed)
Pt called stating that her wound vac was beeping. The dressing had loosened on one side and wasn't keeping a good seal anymore. When she closed it, the vac turned back on and began working. Pt had no other dressing materials or tape. Dr Charlotta Newton stated for the pt to remove the vac and dressing and she would evaluate it at her appt tomorrow. Pt stated that she was also having some pain with urination. Pt instructed to push fluids and it would be addressed at her appt also. Pt confirmed understanding. ?

## 2021-12-20 ENCOUNTER — Encounter: Payer: Medicaid Other | Admitting: Obstetrics & Gynecology

## 2021-12-20 ENCOUNTER — Ambulatory Visit (INDEPENDENT_AMBULATORY_CARE_PROVIDER_SITE_OTHER): Payer: Medicaid Other | Admitting: Obstetrics & Gynecology

## 2021-12-20 ENCOUNTER — Encounter: Payer: Self-pay | Admitting: Obstetrics & Gynecology

## 2021-12-20 ENCOUNTER — Other Ambulatory Visit: Payer: Medicaid Other

## 2021-12-20 ENCOUNTER — Other Ambulatory Visit: Payer: Self-pay

## 2021-12-20 VITALS — BP 111/71 | HR 71 | Ht 62.0 in | Wt 243.2 lb

## 2021-12-20 DIAGNOSIS — Z4889 Encounter for other specified surgical aftercare: Secondary | ICD-10-CM | POA: Diagnosis not present

## 2021-12-20 NOTE — Progress Notes (Signed)
? ? ?  PostOp Visit Note ? ?Claire Harrison is a 26 y.o. [phone] female who presents for a postoperative visit. She is 1 week postop following a repeat C-section on  3/14 ? ?Complicated by: ?- preE with severe features- s/p Lasix, currently on Procardia 30 daily ?-Class B DM- doing ok, but notes low sugars at night and occasional during the day ?-currently on Insulin glargine 40u at bedtime and lispro 2u with meals ? ? ?Denies fever or chills.  Tolerating gen diet.  +Flatus, Regular BMs.  Pain is ok- she is still sore, but trying not to take the oxycodone.  Using tylenol and ibuprofen. Overall doing well and reports no acute complaints ? ? ?Review of Systems ?Pertinent items are noted in HPI.   ? ?Objective:  ?BP 111/71 (BP Location: Right Arm, Patient Position: Sitting, Cuff Size: Large)   Pulse 71   Ht 5\' 2"  (1.575 m)   Wt 243 lb 3.2 oz (110.3 kg)   LMP 03/26/2021   Breastfeeding Yes   BMI 44.48 kg/m?   ? ?Physical Examination:  ?GENERAL ASSESSMENT: well developed and well nourished ?SKIN: warm and dry ?CHEST: normal air exchange, respiratory effort normal with no retractions ?HEART: regular rate and rhythm ?ABDOMEN: soft, non-distended, obese ?INCISION: well healed, C/D/I ?EXTREMITY: minimal edema, no calf tenderness bilaterally ?PSYCH: mood appropriate, normal affect ?      ?Assessment:  ? ? Postop visit ?Class B DM ?Preeclampsia with severe features  ? ?Plan:  ? ?-continue Procardia for now, discontinue 3-4 days prior to next visit ?-decreased long acting to 35u, encouraged pt to schedule f/u with PCP ?Suspect she may no longer need meal coverage ?-meeting postop milestones appropriately ? ?03/28/2021, DO ?Attending Obstetrician & Gynecologist, Faculty Practice ?[facility], [facility] ? ? ?

## 2021-12-24 ENCOUNTER — Other Ambulatory Visit: Payer: Medicaid Other

## 2021-12-26 ENCOUNTER — Encounter: Payer: Self-pay | Admitting: Obstetrics & Gynecology

## 2021-12-27 ENCOUNTER — Other Ambulatory Visit: Payer: Medicaid Other | Admitting: Obstetrics & Gynecology

## 2021-12-30 ENCOUNTER — Encounter: Payer: Self-pay | Admitting: Obstetrics & Gynecology

## 2021-12-30 ENCOUNTER — Ambulatory Visit (INDEPENDENT_AMBULATORY_CARE_PROVIDER_SITE_OTHER): Payer: Medicaid Other | Admitting: Obstetrics & Gynecology

## 2021-12-30 VITALS — BP 127/83 | HR 69 | Wt 240.0 lb

## 2021-12-30 DIAGNOSIS — Z794 Long term (current) use of insulin: Secondary | ICD-10-CM

## 2021-12-30 DIAGNOSIS — Z98891 History of uterine scar from previous surgery: Secondary | ICD-10-CM

## 2021-12-30 DIAGNOSIS — O1414 Severe pre-eclampsia complicating childbirth: Secondary | ICD-10-CM

## 2021-12-30 DIAGNOSIS — E119 Type 2 diabetes mellitus without complications: Secondary | ICD-10-CM

## 2021-12-30 NOTE — Progress Notes (Signed)
?  HPI: ?Patient returns for routine postoperative follow-up having undergone Repeat C section on 12/10/21.  ?The patient's immediate postoperative recovery has been unremarkable. ?Since hospital discharge the patient reports no complaints. ? ? ?Current Outpatient Medications: ?ATROVENT HFA 17 MCG/ACT inhaler, Inhale into the lungs., Disp: , Rfl:  ?ibuprofen (ADVIL) 600 MG tablet, Take 1 tablet (600 mg total) by mouth every 6 (six) hours., Disp: 60 tablet, Rfl: 0 ?insulin glargine (LANTUS) 100 UNIT/ML Solostar Pen, Inject 40 Units into the skin at bedtime., Disp: 3 mL, Rfl: 1 ?insulin lispro (HUMALOG KWIKPEN) 100 UNIT/ML KwikPen, Inject 2 Units into the skin 3 (three) times daily., Disp: 3 mL, Rfl: 1 ?Insulin Pen Needle (PENTIPS) 32G X 4 MM MISC, Use as directed up to 4 times daily with lantus and humalog, Disp: 100 each, Rfl: 11 ?NIFEdipine (ADALAT CC) 30 MG 24 hr tablet, Take 1 tablet (30 mg total) by mouth daily., Disp: 30 tablet, Rfl: 0 ?oxyCODONE-acetaminophen (PERCOCET/ROXICET) 5-325 MG tablet, Take 1-2 tablets by mouth every 4 (four) hours as needed for moderate pain., Disp: 20 tablet, Rfl: 0 ?Prenatal Vit-Fe Fumarate-FA (PRENATAL VITAMIN PO), Take by mouth daily. , Disp: , Rfl:  ? ?No current facility-administered medications for this visit. ? ? ? ?Blood pressure 127/83, pulse 69, weight 240 lb (108.9 kg), currently breastfeeding. ? ?Physical Exam: ?Incision clean dry intact ?Abdomen is benign ? ?Diagnostic Tests: ? ? ?Pathology: ? ? ?Impression + Management plan: ?  ICD-10-CM   ?1. S/P cesarean section  Z98.891   ? normal post op course  ?  ?2. Pre-eclampsia, severe, delivered  O14.14   ? on procardia xl 30 mg daily  ?  ?3. Type 2 diabetes mellitus without complication, with long-term current use of insulin (HCC)  E11.9   ? Z79.4   ? on Lantus 35 units and 2 units humalog with meals  ?  ? ?Birth Control Nexplanon in hospital ? ? ? ?Medications Prescribed this encounter: ?No orders of the defined types were  placed in this encounter. ? ? ? ? ?Follow up: ?Return for keep scheduled. ? ? ? ?Florian Buff, MD ?Attending Physician for the Center for [facility] and ?[facility] ?12/30/2021 ?2:55 PM ? ? ? ? ?

## 2021-12-31 ENCOUNTER — Inpatient Hospital Stay (HOSPITAL_COMMUNITY): Admit: 2021-12-31 | Payer: Self-pay

## 2022-01-01 ENCOUNTER — Encounter: Payer: Medicaid Other | Admitting: Medical

## 2022-01-14 ENCOUNTER — Ambulatory Visit (INDEPENDENT_AMBULATORY_CARE_PROVIDER_SITE_OTHER): Payer: Medicaid Other | Admitting: Advanced Practice Midwife

## 2022-01-14 ENCOUNTER — Encounter: Payer: Self-pay | Admitting: Advanced Practice Midwife

## 2022-01-14 DIAGNOSIS — F53 Postpartum depression: Secondary | ICD-10-CM

## 2022-01-14 NOTE — Patient Instructions (Signed)
Claire Harrison [phone]- call if you haven't heard from her in 1 week ?

## 2022-01-14 NOTE — Progress Notes (Signed)
? ?POSTPARTUM VISIT ?Patient name: Claire Harrison MRN [id number]  Date of birth: March 14, 1996 ?Chief Complaint:   ?Postpartum Care ? ?History of Present Illness:   ?Claire Harrison is a 26 y.o. [phone] Hispanic female being seen today for a postpartum visit. She is 5 weeks postpartum following a repeat cesarean section, low transverse incision at 37.0 gestational weeks. IOL: n/a,  Anesthesia: spinal.  Laceration: n/a.  Complications: rLTCS due to severe pre-e; mag sulfate x 24hr PP. Inpatient contraception: yes , Nexplanon placement on POD#2 .   ?Pregnancy complicated by Q6ST, prev C/S x 2, rubella nonimmune, pre-e with SF (H/A and visual changes) . ?Tobacco use: no. Substance use disorder: no. ?Last pap smear: Sept 2021 and results were negative per pt report at [facility] . Next pap smear due: Sept 2024 ?No LMP recorded (lmp unknown). Patient has had an implant. ? ?Postpartum course has been complicated by going home from the hospital on Procardia 67m . Bleeding spotting. Bowel function is normal. Bladder function is normal. Urinary incontinence? no, fecal incontinence? no ?Patient is sexually active. Last sexual activity:  a couple of days ago; was uncomfortable w deep penetration- rev'd position changes and extra lubrication, also more healing time . Desired contraception: PP Nexplanon placed. Patient does not know about a pregnancy in the future.  Desired family size is unsure # of children.  ? Upstream - 01/14/22 1423   ? ?  ? Pregnancy Intention Screening  ? Does the patient want to become pregnant in the next year? No   ? Does the patient's partner want to become pregnant in the next year? No   ? Would the patient like to discuss contraceptive options today? No   ?  ? Contraception Wrap Up  ? Current Method Hormonal Implant   ? End Method Hormonal Implant   ? Contraception Counseling Provided No   ? ?  ?  ? ?  ? ?The pregnancy intention screening data noted above was reviewed. Potential methods of  contraception were discussed. The patient elected to proceed with Hormonal Implant. ? ?Edinburgh Postpartum Depression Screening: positive: H/O mental health disorder: yes , has had depression & PPD. Currently on meds: no.  Currently in therapy: no.  Sleeping: as much as can be expected.  Appetite: nl.  Still finds joy in things she used to: Yes.  Support at home: has a friend who is going to start helping.  SI/HI/II: no.  Interested in medicine: No.  Interested in therapy: Yes. ? Edinburgh Postnatal Depression Scale - 01/14/22 1421   ? ?  ? Edinburgh Postnatal Depression Scale:  In the Past 7 Days  ? I have been able to laugh and see the funny side of things. 0   ? I have looked forward with enjoyment to things. 1   ? I have blamed myself unnecessarily when things went wrong. 2   ? I have been anxious or worried for no good reason. 2   ? I have felt scared or panicky for no good reason. 2   ? Things have been getting on top of me. 2   ? I have been so unhappy that I have had difficulty sleeping. 0   ? I have felt sad or miserable. 2   ? I have been so unhappy that I have been crying. 1   ? The thought of harming myself has occurred to me. 0   ? Edinburgh Postnatal Depression Scale Total 12   ? ?  ?  ? ?  ?  ? ?  07/01/2021  ? 11:02 AM 05/17/2021  ?  9:20 AM  ?GAD 7 : Generalized Anxiety Score  ?Nervous, Anxious, on Edge 2 1  ?Control/stop worrying 1 1  ?Worry too much - different things 1 0  ?Trouble relaxing 1 0  ?Restless 0 0  ?Easily annoyed or irritable 1 1  ?Afraid - awful might happen 1 0  ?Total GAD 7 Score 7 3  ? ? ? ?Baby's course has been uncomplicated. Baby is feeding by breast: milk supply adequate. Infant has a pediatrician/family doctor? Yes.  Childcare strategy if returning to work/school: n/a-stay at home mom.  Pt has material needs met for her and baby: Yes.   ?Review of Systems:   ?Pertinent items are noted in HPI ?Denies Abnormal vaginal discharge w/ itching/odor/irritation, headaches, visual  changes, shortness of breath, chest pain, abdominal pain, severe nausea/vomiting, or problems with urination or bowel movements. ?Pertinent History Reviewed:  ?Reviewed past medical,surgical, obstetrical and family history.  ?Reviewed problem list, medications and allergies. ?OB History  ?Gravida Para Term Preterm AB Living  ?4 3 2 1 1 3  ?SAB IAB Ectopic Multiple Live Births  ?1     0 3  ?  ?# Outcome Date GA Lbr Len/2nd Weight Sex Delivery Anes PTL Lv  ?4 Term 12/10/21 [redacted]w[redacted]d  8 lb 11 oz (3.941 kg) M CS-LVertical Spinal  LIV  ?   Birth Comments: WDL  ?3 Preterm 04/03/19 [redacted]w[redacted]d  8 lb (3.629 kg) M CS-LTranv Spinal N LIV  ?   Complications: Pre-eclampsia, Diabetes mellitus  ?2 Term 04/29/17 [redacted]w[redacted]d  10 lb 1 oz (4.564 kg) F CS-LTranv Spinal N LIV  ?   Complications: Pre-eclampsia, Diabetes mellitus  ?1 SAB 07/29/16 [redacted]w[redacted]d         ? ?Physical Assessment:  ? ?Vitals:  ? 01/14/22 1409  ?BP: 96/65  ?Pulse: 73  ?Weight: 238 lb (108 kg)  ?Height: 5' 2" (1.575 m)  ?Body mass index is 43.53 kg/m?. ? ?     Physical Examination:  ? General appearance: alert, well appearing, and in no distress ? Mental status: alert, oriented to person, place, and time ? Skin: warm & dry  ? Cardiovascular: normal heart rate noted  ? Respiratory: normal respiratory effort, no distress  ? Breasts: deferred, no complaints  ? Abdomen: soft, non-tender; incision well-approx and healed ? Pelvic: examination not indicated. Thin prep pap obtained: No ? Rectal: not examined ? Extremities: Edema: Trace and none  ? ?      ?No results found for this or any previous visit (from the past 24 hour(s)).  ?Assessment & Plan:  ?1) Postpartum exam ?2) Five wks s/p repeat cesarean section, low transverse incision ?3) breast feeding ?4) Depression screening- positive; declines meds for now but interested in referral to [facility] ?5) Contraception counseling: had Nexplanon placement in hospital on 12/12/21 ?6) T2DM, sees Dr Ruckers for management, has appt next week ?7) s/p  severe pre-e, Procardia stopped x 2wks ago with nl BP today ? ?Essential components of care per ACOG recommendations: ? ?1.  Mood and well being:  ?If positive depression screen, discussed and plan developed.  ?If using tobacco we discussed reduction/cessation and risk of relapse ?If current substance abuse, we discussed and referral to local resources was offered.  ? ?2. Infant care and feeding:  ?If breastfeeding, discussed returning to work, pumping, breastfeeding-associated pain, guidance regarding return to fertility while lactating if not using another method. If needed, patient was provided with a letter to   be allowed to pump q 2-3hrs to support lactation in a private location with access to a refrigerator to store breastmilk.   ?Recommended that all caregivers be immunized for flu, pertussis and other preventable communicable diseases ?If pt does not have material needs met for her/baby, referred to local resources for help obtaining these. ? ?3. Sexuality, contraception and birth spacing ?Provided guidance regarding sexuality, management of dyspareunia, and resumption of intercourse ?Discussed avoiding interpregnancy interval <6mths and recommended birth spacing of 18 months ? ?4. Sleep and fatigue ?Discussed coping options for fatigue and sleep disruption ?Encouraged family/partner/community support of 4 hrs of uninterrupted sleep to help with mood and fatigue ? ?5. Physical recovery  ?If pt had a C/S, assessed incisional pain and providing guidance on normal vs prolonged recovery ?If pt had a laceration, perineal healing and pain reviewed.  ?If urinary or fecal incontinence, discussed management and referred to PT or uro/gyn if indicated  ?Patient is safe to resume physical activity. Discussed attainment of healthy weight. ? ?6.  Chronic disease management ?Discussed pregnancy complications if any, and their implications for future childbearing and long-term maternal health. ?Review recommendations for  prevention of recurrent pregnancy complications, such as 17 hydroxyprogesterone caproate to reduce risk for recurrent PTB not applicable, or aspirin to reduce risk of preeclampsia yes. ?Pt had GDM: no. If yes, 2

## 2022-01-15 ENCOUNTER — Ambulatory Visit: Payer: Medicaid Other | Admitting: Advanced Practice Midwife

## 2022-02-18 ENCOUNTER — Encounter: Payer: Self-pay | Admitting: *Deleted

## 2022-02-21 ENCOUNTER — Encounter: Payer: Self-pay | Admitting: Psychiatry

## 2022-02-21 ENCOUNTER — Ambulatory Visit: Payer: Medicaid Other | Admitting: Psychiatry

## 2022-02-21 VITALS — BP 113/75 | HR 80 | Ht 61.5 in | Wt 237.4 lb

## 2022-02-21 DIAGNOSIS — G43109 Migraine with aura, not intractable, without status migrainosus: Secondary | ICD-10-CM | POA: Diagnosis not present

## 2022-02-21 NOTE — Progress Notes (Addendum)
Referring:  Suzan Slickucker, Alethea Y, MD [address] [place] [place] [place],  [place] [id number]  PCP: Suzan Slickucker, Alethea Y, MD  Neurology was asked to evaluate Claire Harrison, a 26 year old female for a chief complaint of headaches.  Our recommendations of care will be communicated by shared medical record.    CC:  headaches  History provided from self  HPI:  Medical co-morbidities: DM2, asthma  The patient presents for evaluation of headaches which began 2 years ago. They worsened when she was pregnant with her son. Pregnancy was complicated by pre-eclampsia. Gave birth 2 months ago and is currently breastfeeding. They improved somewhat but are still occurring once every other week. Headaches are described as holocephalic throbbing with associated photophobia, phonophobia, and nausea. Also has blurred vision and flashing lights with her headaches which can last 30 minutes at a time. Headaches can last 2-3 days at a time.   She takes ibuprofen as needed which helps somewhat. Was prescribed Fioricet by her OB/gyn and will occasionally take this as well.   Headache History: Onset: 2 years ago Triggers: sunlight Aura: flashing lights, blurred vision (lasts 30 minutes) Location: holocephalic Quality/Description: throbbing Associated Symptoms:  Photophobia: yes  Phonophobia: yes  Nausea: yes Worse with activity?: yes Duration of headaches: 2-3 days  Headache days per month: 4 Headache free days per month: 28  Current Treatment: Abortive Ibuprofen  Preventative none  Prior Therapies                                 Ibuprofen  LABS:    Latest Ref Rng & Units 12/11/2021    4:51 AM 12/10/2021    4:16 PM 11/30/2021    6:23 PM  CBC  WBC 4.0 - 10.5 K/uL 19.1   10.9   8.0    Hemoglobin 12.0 - 15.0 g/dL 13.010.6   86.511.4   78.410.8    Hematocrit 36.0 - 46.0 % 31.3   34.1   34.0    Platelets 150 - 400 K/uL 359   352   326        Latest Ref Rng & Units 12/10/2021    4:16 PM 11/30/2021    6:23 PM 11/26/2021    11:26 AM  CMP  Glucose 70 - 99 mg/dL 696221   [id number]   [id number]    BUN 6 - 20 mg/dL 11   8   9     Creatinine 0.44 - 1.00 mg/dL 1.320.63   4.400.45   1.020.54    Sodium 135 - 145 mmol/L 136   136   137    Potassium 3.5 - 5.1 mmol/L 3.9   3.5   4.4    Chloride 98 - 111 mmol/L 107   110   105    CO2 22 - 32 mmol/L 21   19   19     Calcium 8.9 - 10.3 mg/dL 9.1   8.5   9.0    Total Protein 6.5 - 8.1 g/dL 6.0   6.3   6.2    Total Bilirubin 0.3 - 1.2 mg/dL <7.2<0.1   0.2   <5.3<0.2    Alkaline Phos 38 - 126 U/L 115   119   144    AST 15 - 41 U/L 18   27   11     ALT 0 - 44 U/L 15   28   13        IMAGING:  none  Current Outpatient Medications on File Prior to Visit  Medication Sig Dispense Refill   ATROVENT HFA 17 MCG/ACT inhaler Inhale into the lungs.     cetirizine (ZYRTEC) 10 MG tablet Take 10 mg by mouth daily.     ibuprofen (ADVIL) 600 MG tablet Take 1 tablet (600 mg total) by mouth every 6 (six) hours. 60 tablet 0   insulin glargine (LANTUS) 100 UNIT/ML Solostar Pen Inject 40 Units into the skin at bedtime. 3 mL 1   insulin lispro (HUMALOG KWIKPEN) 100 UNIT/ML KwikPen Inject 2 Units into the skin 3 (three) times daily. 3 mL 1   Insulin Pen Needle (PENTIPS) 32G X 4 MM MISC Use as directed up to 4 times daily with lantus and humalog 100 each 11   NIFEdipine (ADALAT CC) 30 MG 24 hr tablet Take 1 tablet (30 mg total) by mouth daily. 30 tablet 0   Prenatal Vit-Fe Fumarate-FA (PRENATAL VITAMIN PO) Take by mouth daily.      triamcinolone ointment (KENALOG) 0.1 % Apply 1 application. topically 2 (two) times daily.     No current facility-administered medications on file prior to visit.     Allergies: Allergies  Allergen Reactions   Bee Venom Hives   Penicillins Anaphylaxis   Mango Flavor Hives and Swelling   Peanut-Containing Drug Products    Latex Rash    Family History: Family History  Problem Relation Age of Onset   Diabetes Mother    Hypertension Mother    Kidney failure Mother    Miscarriages /  [place] Mother    Diabetes Father    Diabetes Sister    Miscarriages / [place] Sister    Diabetes Sister    Miscarriages / Stillbirths Sister    Scoliosis Brother    Heart murmur Daughter    Bronchitis Son    Colon cancer Maternal Aunt    Diabetes Maternal Grandmother    Kidney failure Maternal Grandmother      Past Medical History: Past Medical History:  Diagnosis Date   Anxiety    Asthma    Blood transfusion without reported diagnosis    Depression    Gestational diabetes    Migraine without aura and without status migrainosus, not intractable    Pre-eclampsia     Past Surgical History Past Surgical History:  Procedure Laterality Date   CESAREAN SECTION     2018, 2020   CESAREAN SECTION N/A 12/10/2021   Procedure: CESAREAN SECTION;  Surgeon: Catalina Antigua, MD;  Location: [facility] ORS;  Service: Obstetrics;  Laterality: N/A;   DILATION AND CURETTAGE OF UTERUS N/A 08/01/2016   Procedure: SUCTION DILATATION AND CURETTAGE;  Surgeon: Tilda Burrow, MD;  Location: [facility] ORS;  Service: Gynecology;  Laterality: N/A;    Social History: Social History   Tobacco Use   Smoking status: Never   Smokeless tobacco: Never  Vaping Use   Vaping Use: Never used  Substance Use Topics   Alcohol use: No   Drug use: No    ROS: Negative for fevers, chills. Positive for headaches. All other systems reviewed and negative unless stated otherwise in HPI.   Physical Exam:   Vital Signs: BP 113/75   Pulse 80   Ht 5' 1.5" (1.562 m)   Wt 237 lb 6.4 oz (107.7 kg)   BMI 44.13 kg/m  GENERAL: well appearing,in no acute distress,alert SKIN:  Color, texture, turgor normal. No rashes or lesions HEAD:  Normocephalic/atraumatic. CV:  RRR RESP: Normal respiratory effort MSK: +  tenderness to palpation over bilateral shoulders  NEUROLOGICAL: Mental Status: Alert, oriented to person, place and time,Follows commands Cranial Nerves: PERRL, visual fields intact to confrontation,  extraocular movements intact, facial sensation intact, no facial droop or ptosis, hearing grossly intact, no dysarthria Motor: muscle strength 5/5 both upper and lower extremities Reflexes: 2+ throughout Sensation: intact to light touch all 4 extremities Coordination: Finger-to- nose-finger intact bilaterally Gait: normal-based   IMPRESSION: 26 year old female with a history of DM2, asthma who presents for evaluation of migraines. Her headache pattern is consistent with episodic migraine with aura. Discussed treatment options during breastfeeding. She would prefer to avoid starting a new medication at this time. Provided supplement information for migraine prevention (Mg, B2). She will consider occipital nerve block and contact the office if she chooses to pursue this.  PLAN: -Supplement information provided for migraine prevention (Mg, B2) -Consider occipital nerve block, neck PT  I spent a total of 29 minutes chart reviewing and counseling the patient. Headache education was done. Discussed treatment options including preventive and acute medications, natural supplements, and physical therapy. Discussed medication side effects, adverse reactions and drug interactions. Written educational materials and patient instructions outlining all of the above were given.  Follow-up: as needed   Ocie Doyne, MD 02/21/2022   10:53 AM

## 2022-02-21 NOTE — Patient Instructions (Addendum)
Migraine and Breastfeeding:  Data is limiting regarding the safety of migraine medications during breastfeeding. These are some of the medications which appear to be safe during lactation.  Rescue Medications  -Ibuprofen is the preferred NSAID to use while breastfeeding -Tylenol -Eletriptan. Exposure to infant can be minimized by discarding breast milk for 24 hours after dose. -Sumatriptan. Exposure to infant can be minimized by discarding breast milk for 12 hours after dose.  Preventive Supplements -Magnesium oxide 400 mg daily -Riboflavin 200 mg twice a day  Non-medication options: -Physical therapy for the neck -Occipital nerve blocks (injection in the back of the head) -Acupuncture -Biofeedback/Relaxation techniques

## 2022-03-03 ENCOUNTER — Telehealth: Payer: Self-pay | Admitting: Clinical

## 2022-03-03 NOTE — Telephone Encounter (Signed)
Attempt call regarding referral; Left HIPPA-compliant message to call back Tzippy Testerman from [facility] at  [phone] (Tifani Dack's office).    
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
## 2022-04-15 ENCOUNTER — Ambulatory Visit (INDEPENDENT_AMBULATORY_CARE_PROVIDER_SITE_OTHER): Payer: Medicaid Other | Admitting: Clinical

## 2022-04-15 DIAGNOSIS — F4323 Adjustment disorder with mixed anxiety and depressed mood: Secondary | ICD-10-CM | POA: Diagnosis not present

## 2022-04-15 NOTE — Patient Instructions (Signed)
[facility] [address] [place], [place] [address] [phone] (main office) [phone] (Howell Groesbeck's office)   
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
## 2022-04-30 ENCOUNTER — Ambulatory Visit (INDEPENDENT_AMBULATORY_CARE_PROVIDER_SITE_OTHER): Payer: Medicaid Other | Admitting: Clinical

## 2022-04-30 DIAGNOSIS — F4323 Adjustment disorder with mixed anxiety and depressed mood: Secondary | ICD-10-CM | POA: Diagnosis not present

## 2022-04-30 DIAGNOSIS — Z658 Other specified problems related to psychosocial circumstances: Secondary | ICD-10-CM

## 2022-04-30 NOTE — Patient Instructions (Signed)
Center for [facility] at [facility] [address] [place], [place] [address] [phone] (main office) [phone] [facility] office)  [facility]  [address], [place], [place] [address] [phone] [email or web address]  [facility]:  [address], 2nd floor, [place], [place] [address] [phone])  Main line [phone]  [place] location **Parking is available in the [address] parking deck.  [place]:  [address] [place], [place] [address] [phone]) Main line [phone]  [place] location  **Located on the backside of the [facility] [place]. Limited parking is available off of [address]  [place] hours: Monday -Friday 8:30am-4:30pm  [email or web address]    If immediate emergency, call 9-1-1, or [facility] [place] 24 hour crisis hotline at: [phone]

## 2022-05-03 ENCOUNTER — Other Ambulatory Visit: Payer: Self-pay | Admitting: "Endocrinology

## 2022-05-26 NOTE — BH Specialist Note (Signed)
Pt did not arrive to video visit and did not answer the phone; Left HIPPA-compliant message to call back Keidrick Murty from [facility] at  [facility] at  [phone] (Nicanor Mendolia's office).  ?; left MyChart message for patient.  ? ?

## 2022-06-04 ENCOUNTER — Ambulatory Visit: Payer: Medicaid Other | Admitting: Clinical

## 2022-06-04 DIAGNOSIS — Z91199 Patient's noncompliance with other medical treatment and regimen due to unspecified reason: Secondary | ICD-10-CM

## 2022-06-06 NOTE — BH Specialist Note (Signed)
Pt did not arrive to video visit and did not answer the phone; Left HIPPA-compliant message to call back Felita Bump from [facility] at [facility] at  [phone] (Delano Frate's office).  ?; left MyChart message for patient.  ? ?

## 2022-06-13 ENCOUNTER — Ambulatory Visit: Payer: Medicaid Other | Admitting: Clinical

## 2022-06-13 DIAGNOSIS — Z91199 Patient's noncompliance with other medical treatment and regimen due to unspecified reason: Secondary | ICD-10-CM

## 2022-07-09 NOTE — BH Specialist Note (Signed)
Integrated Behavioral Health via Telemedicine Visit  07/15/2022 Claire Harrison [id number]  Number of Integrated Behavioral Health Clinician visits: 3- Third Visit  Session Start time: 1015   Session End time: 1054  Total time in minutes: 39   Referring Provider: Cam Hai, CNM Patient/Family location: Home [facility] Provider location: [facility]  All persons participating in visit: [name] and [facility] Rheya Minogue   Types of Service: Individual psychotherapy and Video visit  I connected with [name] and/or Claire Harrison's  n/a  via  Telephone or Engineer, civil (consulting)  (Video is Surveyor, mining) and verified that I am speaking with the correct person using two identifiers. Discussed confidentiality: Yes   I discussed the limitations of telemedicine and the availability of in person appointments.  Discussed there is a possibility of technology failure and discussed alternative modes of communication if that failure occurs.  I discussed that engaging in this telemedicine visit, they consent to the provision of behavioral healthcare and the services will be billed under their insurance.  Patient and/or legal guardian expressed understanding and consented to Telemedicine visit: Yes   Presenting Concerns: Patient and/or family reports the following symptoms/concerns: Processing feelings regarding current life stress and mourning loss of father(relationship conflict, being emotional support for family).  Duration of problem: Ongoing; Severity of problem:  moderately severe  Mod sev  Patient and/or Family's Strengths/Protective Factors: Social connections, Sense of purpose, and Physical Health (exercise, healthy diet, medication compliance, etc.)  Goals Addressed: Patient will:  Reduce symptoms of: anxiety, depression, and stress   Increase knowledge and/or ability of:     Demonstrate ability to: Increase healthy adjustment to current life circumstances and Increase motivation to adhere to plan of care  Progress towards Goals: Ongoing  Interventions: Interventions utilized:  Supportive Reflection Standardized Assessments completed: GAD-7 and PHQ 9  Patient and/or Family Response: Patient agrees with treatment plan.   Assessment: Patient currently experiencing Adjustment disorder with mixed anxious and depressed mood and Grief  Patient may benefit from continued therapeutic interventions.  Plan: Follow up with behavioral health clinician on : Two weeks Behavioral recommendations:  -Continue using journal for telling your story and processing feelings  -Continue setting healthy boundaries with others; walk away when needed -Continue plan to use [facility] as needed in the future -Continue plan to obtain passports for family members as first step towards trip to visit father's grave in 2024  Referral(s): Integrated [employer] (In Clinic)  I discussed the assessment and treatment plan with the patient and/or parent/guardian. They were provided an opportunity to ask questions and all were answered. They agreed with the plan and demonstrated an understanding of the instructions.   They were advised to call back or seek an in-person evaluation if the symptoms worsen or if the condition fails to improve as anticipated.  Rae Lips, LCSW     07/15/2022   10:27 AM 04/15/2022    8:41 AM 07/01/2021   11:02 AM 05/17/2021    9:19 AM 10/06/2018   10:52 AM  Depression screen PHQ 2/9  Decreased Interest 0 2 1 1 3   Down, Depressed, Hopeless 1 0 1 0 3  PHQ - 2 Score 1 2 2 1 6   Altered sleeping 1 3 2 1    Tired, decreased energy 1 3 2 2 3   Change in appetite 1 2 1  0 0  Feeling bad or failure about yourself  0 1 0  0 2  Trouble concentrating 3 0 0 0 0  Moving slowly or fidgety/restless 0 3 0 0 0  Suicidal thoughts 0 0 0 0 0   PHQ-9 Score 7 14 7 4    Difficult doing work/chores     Very difficult      07/15/2022   10:29 AM 04/15/2022    8:44 AM 07/01/2021   11:02 AM 05/17/2021    9:20 AM  GAD 7 : Generalized Anxiety Score  Nervous, Anxious, on Edge 1 0 2 1  Control/stop worrying 1 1 1 1   Worry too much - different things 3 1 1  0  Trouble relaxing 3 3 1  0  Restless 1 3 0 0  Easily annoyed or irritable 3 3 1 1   Afraid - awful might happen 3 2 1  0  Total GAD 7 Score 15 13 7  3

## 2022-07-15 ENCOUNTER — Ambulatory Visit (INDEPENDENT_AMBULATORY_CARE_PROVIDER_SITE_OTHER): Payer: Medicaid Other | Admitting: Clinical

## 2022-07-15 DIAGNOSIS — F4321 Adjustment disorder with depressed mood: Secondary | ICD-10-CM

## 2022-07-15 DIAGNOSIS — Z658 Other specified problems related to psychosocial circumstances: Secondary | ICD-10-CM

## 2022-07-15 DIAGNOSIS — F4323 Adjustment disorder with mixed anxiety and depressed mood: Secondary | ICD-10-CM | POA: Diagnosis not present

## 2022-07-17 NOTE — BH Specialist Note (Deleted)
Integrated Behavioral Health via Telemedicine Visit  07/17/2022 Sheli Dorin [id number]  Number of [place] Clinician visits: 3- Third Visit  Session Start time: 8182   Session End time: 9937  Total time in minutes: 39   Referring Provider: *** Patient/Family location: [facility] Provider location: *** All persons participating in visit: *** Types of Service: {CHL AMB TYPE OF SERVICE:[phone]}  I connected with [name] and/or Naoko Capriotti's {family members:20773} via  Telephone or Geologist, engineering  (Video is Tree surgeon) and verified that I am speaking with the correct person using two identifiers. Discussed confidentiality: {YES/NO:21197}  I discussed the limitations of telemedicine and the availability of in person appointments.  Discussed there is a possibility of technology failure and discussed alternative modes of communication if that failure occurs.  I discussed that engaging in this telemedicine visit, they consent to the provision of behavioral healthcare and the services will be billed under their insurance.  Patient and/or legal guardian expressed understanding and consented to Telemedicine visit: {YES/NO:21197}  Presenting Concerns: Patient and/or family reports the following symptoms/concerns: *** Duration of problem: ***; Severity of problem: {Mild/Moderate/Severe:20260}  Patient and/or Family's Strengths/Protective Factors: {CHL AMB BH PROTECTIVE FACTORS:[phone]}  Goals Addressed: Patient will:  Reduce symptoms of: {IBH Symptoms:21014056}   Increase knowledge and/or ability of: {IBH Patient Tools:21014057}   Demonstrate ability to: {IBH Goals:21014053}  Progress towards Goals: {CHL AMB BH PROGRESS TOWARDS GOALS:[id number]}  Interventions: Interventions utilized:  {IBH Interventions:21014054} Standardized Assessments completed: {IBH Screening Tools:21014051}  Patient and/or Family  Response: ***  Assessment: Patient currently experiencing ***.   Patient may benefit from ***.  Plan: Follow up with behavioral health clinician on : *** Behavioral recommendations: *** Referral(s): {IBH Referrals:21014055}  I discussed the assessment and treatment plan with the patient and/or parent/guardian. They were provided an opportunity to ask questions and all were answered. They agreed with the plan and demonstrated an understanding of the instructions.   They were advised to call back or seek an in-person evaluation if the symptoms worsen or if the condition fails to improve as anticipated.  Caroleen Hamman Shaneca Orne, LCSW

## 2022-07-30 NOTE — BH Specialist Note (Signed)
Error; Rescheduled for 08/15/2022

## 2022-08-08 ENCOUNTER — Ambulatory Visit: Payer: Medicaid Other | Admitting: Clinical

## 2022-08-08 NOTE — BH Specialist Note (Signed)
Integrated Behavioral Health via Telemedicine Visit  08/08/2022 Claire Harrison [id number]  Number of Integrated Behavioral Health Clinician visits: 3- Third Visit  Session Start time: 1015   Session End time: 1054  Total time in minutes: 39   Referring Provider: Cam Hai, CNM Patient/Family location: Home [name] Provider location: [name]  All persons participating in visit: [name] and [name] [name]   Types of Service: Individual psychotherapy  I connected with [name] and/or [name]'s  n/a  via  Telephone or Engineer, civil (consulting)  (Video is Surveyor, mining) and verified that I am speaking with the correct person using two identifiers. Discussed confidentiality: Yes   I discussed the limitations of telemedicine and the availability of in person appointments.  Discussed there is a possibility of technology failure and discussed alternative modes of communication if that failure occurs.  I discussed that engaging in this telemedicine visit, they consent to the provision of behavioral healthcare and the services will be billed under their insurance.  Patient and/or legal guardian expressed understanding and consented to Telemedicine visit: Yes   Presenting Concerns:  Patient and/or family reports the following symptoms/concerns: Depressed and angry, feels bad about lashing out at others; attributes to getting over household sickness and grieving loss of father.  Duration of problem: Increase over time; Severity of problem:  moderately severe  Patient and/or Family's Strengths/Protective Factors: Social connections, Concrete supports in place (healthy food, safe environments, etc.), Sense of purpose, and Physical Health (exercise, healthy diet, medication compliance, etc.)  Goals Addressed: Patient will:  Reduce symptoms of: anxiety, depression, mood  instability, and stress   Demonstrate ability to: Increase healthy adjustment to current life circumstances  Progress towards Goals: Ongoing  Interventions: Interventions utilized:  Solution-Focused Strategies Standardized Assessments completed: Not Needed  Patient and/or Family Response: Patient agrees with treatment plan.   Assessment: Patient currently experiencing Grief and Adjustment disorder with mixed anxious and depressed mood.   Patient may benefit from continued therapeutic interventions.  Plan: Follow up with behavioral health clinician on : Two weeks Behavioral recommendations:  -Continue plan to take children to [employer] today -Continue plan to discuss financial limit with friend regarding Thanksgiving dinner -Discussion with husband about setting aside one full day at home with no outside distractions (phones off, etc.)  -Consider planning upcoming date night with husband without children -Continue plan to obtain passports with goal of visiting father's grave/time with family for closure Referral(s): Integrated [employer] (In Clinic)  I discussed the assessment and treatment plan with the patient and/or parent/guardian. They were provided an opportunity to ask questions and all were answered. They agreed with the plan and demonstrated an understanding of the instructions.   They were advised to call back or seek an in-person evaluation if the symptoms worsen or if the condition fails to improve as anticipated.  [name], LCSW

## 2022-08-15 ENCOUNTER — Ambulatory Visit (INDEPENDENT_AMBULATORY_CARE_PROVIDER_SITE_OTHER): Payer: Medicaid Other | Admitting: Clinical

## 2022-08-15 DIAGNOSIS — F4323 Adjustment disorder with mixed anxiety and depressed mood: Secondary | ICD-10-CM

## 2022-08-15 DIAGNOSIS — F4321 Adjustment disorder with depressed mood: Secondary | ICD-10-CM

## 2022-08-15 NOTE — BH Specialist Note (Signed)
Integrated Behavioral Health via Telemedicine Visit  08/29/2022 Claire Harrison [id number]  Number of Integrated Behavioral Health Clinician visits: 4- Fourth Visit  Session Start time: 0845   Session End time: 0919  Total time in minutes: 34   Referring Provider: Cam Hai, CNM Patient/Family location: Home [facility] Provider location: [facility]  All persons participating in visit: [name] and [facility] Dasiah Hooley   Types of Service: Individual psychotherapy and Telephone visit  I connected with Claire Harrison and/or Claire Harrison's  n/a  via  Telephone or Engineer, civil (consulting)  (Video is Caregility application) and verified that I am speaking with the correct person using two identifiers. Discussed confidentiality: Yes   I discussed the limitations of telemedicine and the availability of in person appointments.  Discussed there is a possibility of technology failure and discussed alternative modes of communication if that failure occurs.  I discussed that engaging in this telemedicine visit, they consent to the provision of behavioral healthcare and the services will be billed under their insurance.  Patient and/or legal guardian expressed understanding and consented to Telemedicine visit: Yes   Presenting Concerns: Patient and/or family reports the following symptoms/concerns: Processing feelings (including increase in anger) regarding being put down by others for being sick and missing Thanksgiving dinner and grieving father with first holiday season without him. Pt's goal for the remainder of the year is peace; deciding to prioritize time with family at home.  Duration of problem: Increase over time; Severity of problem: moderate  Patient and/or Family's Strengths/Protective Factors: Social connections, Concrete supports in place (healthy food, safe environments, etc.), Sense of  purpose, and Physical Health (exercise, healthy diet, medication compliance, etc.)  Goals Addressed: Patient will:  Reduce symptoms of: anxiety, depression, mood instability, and stress   Increase knowledge and/or ability of: stress reduction   Demonstrate ability to: Increase healthy adjustment to current life circumstances  Progress towards Goals: Ongoing  Interventions: Interventions utilized:  Solution-Focused Strategies Standardized Assessments completed: GAD-7 and PHQ 9  Patient and/or Family Response: Patient agrees with treatment plan.   Assessment: Patient currently experiencing Grief and Adjustment disorder with mixed anxious and depressed mood.   Patient may benefit from psychoeducation and brief therapeutic interventions regarding coping with symptoms of anxiety, depression, mood instability, grief, life stress .  Plan: Follow up with behavioral health clinician on : One month Behavioral recommendations:  -Continue plan to simplify Christmas holiday time this year(everyone to bring a dish to share, not cooking everything yourself; increased time at home with husband and children; use paper plates, etc) -Continue setting healthy boundaries for yourself and your family daily in the next month -Continue plan to obtain passports to visit father's grave in the future -Prioritize healthy self-care and peace/not engaging with combative people in life for improved wellbeing daily Referral(s): Integrated [employer] (In Clinic)  I discussed the assessment and treatment plan with the patient and/or parent/guardian. They were provided an opportunity to ask questions and all were answered. They agreed with the plan and demonstrated an understanding of the instructions.   They were advised to call back or seek an in-person evaluation if the symptoms worsen or if the condition fails to improve as anticipated.  Rae Lips, LCSW     08/29/2022    8:53 AM  07/15/2022   10:27 AM 04/15/2022    8:41 AM 07/01/2021   11:02 AM 05/17/2021    9:19 AM  Depression screen  PHQ 2/9  Decreased Interest 1 0 2 1 1   Down, Depressed, Hopeless 1 1 0 1 0  PHQ - 2 Score 2 1 2 2 1   Altered sleeping 0 1 3 2 1   Tired, decreased energy 3 1 3 2 2   Change in appetite 1 1 2 1  0  Feeling bad or failure about yourself  1 0 1 0 0  Trouble concentrating 0 3 0 0 0  Moving slowly or fidgety/restless 0 0 3 0 0  Suicidal thoughts 0 0 0 0 0  PHQ-9 Score 7 7 14 7 4       08/29/2022    8:55 AM 07/15/2022   10:29 AM 04/15/2022    8:44 AM 07/01/2021   11:02 AM  GAD 7 : Generalized Anxiety Score  Nervous, Anxious, on Edge 0 1 0 2  Control/stop worrying 0 1 1 1   Worry too much - different things 1 3 1 1   Trouble relaxing 3 3 3 1   Restless 1 1 3  0  Easily annoyed or irritable 3 3 3 1   Afraid - awful might happen 0 3 2 1   Total GAD 7 Score 8 15 13  7

## 2022-08-29 ENCOUNTER — Ambulatory Visit (INDEPENDENT_AMBULATORY_CARE_PROVIDER_SITE_OTHER): Payer: Medicaid Other | Admitting: Clinical

## 2022-08-29 DIAGNOSIS — F4323 Adjustment disorder with mixed anxiety and depressed mood: Secondary | ICD-10-CM

## 2022-08-29 DIAGNOSIS — F4321 Adjustment disorder with depressed mood: Secondary | ICD-10-CM

## 2022-08-29 DIAGNOSIS — Z658 Other specified problems related to psychosocial circumstances: Secondary | ICD-10-CM

## 2022-09-08 ENCOUNTER — Ambulatory Visit (INDEPENDENT_AMBULATORY_CARE_PROVIDER_SITE_OTHER): Payer: Medicaid Other | Admitting: Clinical

## 2022-09-08 DIAGNOSIS — F430 Acute stress reaction: Secondary | ICD-10-CM

## 2022-09-08 NOTE — BH Specialist Note (Signed)
Integrated Behavioral Health via Telemedicine Visit  09/08/2022 Vung Kush [id number]  Number of Integrated Behavioral Health Clinician visits: 6-Sixth Visit  Session Start time: 1515   Session End time: 1603  Total time in minutes: 48   Referring Provider: Cam Hai, CNM Patient/Family location: Home [facility] Provider location: [facility]  All persons participating in visit: [name] and [facility]   Types of Service: Individual psychotherapy and Video visit  I connected with [name] and/or Nirali Soth's  n/a  via  Telephone or Engineer, civil (consulting)  (Video is Surveyor, mining) and verified that I am speaking with the correct person using two identifiers. Discussed confidentiality: Yes   I discussed the limitations of telemedicine and the availability of in person appointments.  Discussed there is a possibility of technology failure and discussed alternative modes of communication if that failure occurs.  I discussed that engaging in this telemedicine visit, they consent to the provision of behavioral healthcare and the services will be billed under their insurance.  Patient and/or legal guardian expressed understanding and consented to Telemedicine visit: Yes   Presenting Concerns: Patient and/or family reports the following symptoms/concerns: Need to process feelings after a friend's story triggered bad memories from her own childhood; wants to be available to help friend without immediate stress response.  Duration of problem: Today; Severity of problem:  moderately severe  Patient and/or Family's Strengths/Protective Factors: Social connections, Concrete supports in place (healthy food, safe environments, etc.), Sense of purpose, and Physical Health (exercise, healthy diet, medication compliance, etc.)  Goals Addressed: Patient will:  Reduce symptoms  of: anxiety and depression   Increase knowledge and/or ability of: stress reduction   Demonstrate ability to: Increase adequate support systems for patient/family  Progress towards Goals: Revised  Interventions: Interventions utilized:  Link to [employer] and Supportive Reflection Standardized Assessments completed: Not Needed  Patient and/or Family Response: Patient agrees with treatment plan.   Assessment: Patient currently experiencing Acute stress response.   Patient may benefit from psychoeducation and brief therapeutic interventions regarding coping with symptoms of depression and anxiety  .  Plan: Follow up with behavioral health clinician on : Three weeks; Call Nettye Flegal at [phone], as needed. Behavioral recommendations:  -Share Family Service of the [place] information with friend (information on After Visit Summary); encourage her to take action -Continue plan to simplify holiday plans this year (with no guilt), as discussed -Consider open discussion with husband regarding friend's family situation, as discussed Referral(s): Integrated [employer] (In Clinic)  I discussed the assessment and treatment plan with the patient and/or parent/guardian. They were provided an opportunity to ask questions and all were answered. They agreed with the plan and demonstrated an understanding of the instructions.   They were advised to call back or seek an in-person evaluation if the symptoms worsen or if the condition fails to improve as anticipated.  Rae Lips, LCSW     08/29/2022    8:53 AM 07/15/2022   10:27 AM 04/15/2022    8:41 AM 07/01/2021   11:02 AM 05/17/2021    9:19 AM  Depression screen PHQ 2/9  Decreased Interest 1 0 2 1 1   Down, Depressed, Hopeless 1 1 0 1 0  PHQ - 2 Score 2 1 2 2 1   Altered sleeping 0 1 3 2 1   Tired, decreased energy 3 1 3 2 2   Change in appetite 1 1 2 1  0  Feeling bad or failure about yourself  1 0 1 0 0  Trouble  concentrating 0 3 0 0 0  Moving slowly or fidgety/restless 0 0 3 0 0  Suicidal thoughts 0 0 0 0 0  PHQ-9 Score 7 7 14 7 4       08/29/2022    8:55 AM 07/15/2022   10:29 AM 04/15/2022    8:44 AM 07/01/2021   11:02 AM  GAD 7 : Generalized Anxiety Score  Nervous, Anxious, on Edge 0 1 0 2  Control/stop worrying 0 1 1 1   Worry too much - different things 1 3 1 1   Trouble relaxing 3 3 3 1   Restless 1 1 3  0  Easily annoyed or irritable 3 3 3 1   Afraid - awful might happen 0 3 2 1   Total GAD 7 Score 8 15 13  7

## 2022-09-08 NOTE — Patient Instructions (Addendum)
Center for [facility] Healthcare at [facility] for Women [address] [place], [place] [address] [phone] (main office) [phone] [facility] office)  Family Service of the [employer] (Domestic Violence, Rape & Victim Assistance)  [phone]  Family Services of the [place], 8:30am-12pm/1pm-2:30pm [address], [place], [place] [phone] (phone); [phone] (fax) [email or web address]  *Accepts Medicaid, sliding-scale*Bilingual services available

## 2022-09-09 ENCOUNTER — Ambulatory Visit: Payer: Medicaid Other | Admitting: Nurse Practitioner

## 2022-09-26 NOTE — BH Specialist Note (Deleted)
Integrated Behavioral Health via Telemedicine Visit  09/26/2022 Jerrilynn Smallwood [id number]  Number of [place] Clinician visits: 6-Sixth Visit  Session Start time: [id number]   Session End time: 1603  Total time in minutes: 65   Referring Provider: *** Patient/Family location: [facility] Provider location: *** All persons participating in visit: *** Types of Service: {CHL AMB TYPE OF SERVICE:[phone]}  I connected with [name] and/or Aldena Belinsky's {family members:20773} via  Telephone or Geologist, engineering  (Video is Tree surgeon) and verified that I am speaking with the correct person using two identifiers. Discussed confidentiality: {YES/NO:21197}  I discussed the limitations of telemedicine and the availability of in person appointments.  Discussed there is a possibility of technology failure and discussed alternative modes of communication if that failure occurs.  I discussed that engaging in this telemedicine visit, they consent to the provision of behavioral healthcare and the services will be billed under their insurance.  Patient and/or legal guardian expressed understanding and consented to Telemedicine visit: {YES/NO:21197}  Presenting Concerns: Patient and/or family reports the following symptoms/concerns: *** Duration of problem: ***; Severity of problem: {Mild/Moderate/Severe:20260}  Patient and/or Family's Strengths/Protective Factors: {CHL AMB BH PROTECTIVE FACTORS:[phone]}  Goals Addressed: Patient will:  Reduce symptoms of: {IBH Symptoms:21014056}   Increase knowledge and/or ability of: {IBH Patient Tools:21014057}   Demonstrate ability to: {IBH Goals:21014053}  Progress towards Goals: {CHL AMB BH PROGRESS TOWARDS GOALS:[phone]}  Interventions: Interventions utilized:  {IBH Interventions:21014054} Standardized Assessments completed: {IBH Screening Tools:21014051}  Patient and/or Family  Response: ***  Assessment: Patient currently experiencing ***.   Patient may benefit from ***.  Plan: Follow up with behavioral health clinician on : *** Behavioral recommendations: *** Referral(s): {IBH Referrals:21014055}  I discussed the assessment and treatment plan with the patient and/or parent/guardian. They were provided an opportunity to ask questions and all were answered. They agreed with the plan and demonstrated an understanding of the instructions.   They were advised to call back or seek an in-person evaluation if the symptoms worsen or if the condition fails to improve as anticipated.  Caroleen Hamman Mahala Rommel, LCSW

## 2022-10-20 ENCOUNTER — Ambulatory Visit (INDEPENDENT_AMBULATORY_CARE_PROVIDER_SITE_OTHER): Payer: Medicaid Other | Admitting: Clinical

## 2022-10-20 DIAGNOSIS — F331 Major depressive disorder, recurrent, moderate: Secondary | ICD-10-CM

## 2022-10-20 DIAGNOSIS — F411 Generalized anxiety disorder: Secondary | ICD-10-CM
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
## 2022-10-22 NOTE — BH Specialist Note (Signed)
Pt did not arrive to video visit and did not answer the phone; Left HIPPA-compliant message to call back Maanya Hippert from [facility] at  [phone] (Marquise Wicke's office).  ?; left MyChart message for patient.  ? ?

## 2022-10-30 ENCOUNTER — Ambulatory Visit: Payer: Medicaid Other | Admitting: Nurse Practitioner

## 2022-11-05 ENCOUNTER — Ambulatory Visit: Payer: Medicaid Other | Admitting: Clinical

## 2022-11-05 DIAGNOSIS — Z91199 Patient's noncompliance with other medical treatment and regimen due to unspecified reason: Secondary | ICD-10-CM

## 2022-11-13 NOTE — BH Specialist Note (Signed)
Integrated Behavioral Health via Telemedicine Visit  11/26/2022 Claire Harrison [id number]  Number of [place] Clinician visits: Additional Visit  Session Start time: [id number]   Session End time: 0922  Total time in minutes: 31   Referring Provider: Serita Grammes, CNM Patient/Family location: Home [facility] Provider location: [facility] for [place] [facility]   All persons participating in visit: [name] and [name]   Types of Service: Individual psychotherapy and Video visit  I connected with [name] and/or [place]  via  Telephone or Geologist, engineering  (Video is Tree surgeon) and verified that I am speaking with the correct person using two identifiers. Discussed confidentiality: Yes   I discussed the limitations of telemedicine and the availability of in person appointments.  Discussed there is a possibility of technology failure and discussed alternative modes of communication if that failure occurs.  I discussed that engaging in this telemedicine visit, they consent to the provision of behavioral healthcare and the services will be billed under their insurance.  Patient and/or legal guardian expressed understanding and consented to Telemedicine visit: Yes   Presenting Concerns: Patient and/or family reports the following symptoms/concerns: Continued conflict with husband and contemplating divorce and goal of obtaining nursing degree.  Duration of problem: Ongoing; Severity of problem:  moderately severe  Patient and/or Family's Strengths/Protective Factors: Social connections, Concrete supports in place (healthy food, safe environments, etc.), Sense of purpose, and Physical Health (exercise, healthy diet, medication compliance, etc.)  Goals Addressed: Patient will:  Reduce symptoms of: anxiety, depression, and stress   Increase knowledge and/or  ability of: stress reduction   Demonstrate ability to: Increase healthy adjustment to current life circumstances and Increase adequate support systems for patient/family  Progress towards Goals: Ongoing  Interventions: Interventions utilized:  Link to [employer], Armed forces logistics/support/administrative officer, and Supportive Reflection Standardized Assessments completed: Not Needed  Patient and/or Family Response: Patient agrees with treatment plan.   Assessment: Patient currently experiencing Major depressive disorder, moderate; Generalized anxiety disorder; Psychosocial stress  Patient may benefit from Patient agrees with treatment plan. .  Plan: Follow up with behavioral health clinician on : Three weeks Behavioral recommendations:  -Continue to contemplate options regarding childcare, school and work, prior to making a decision on whether or not to file for divorce. (Some childcare resources listed on After Visit Summary). Write down potential options to carefully consider.  -Prioritize focusing on managing personal emotions/not reacting emotionally to the hurtful words of others, as much as able; continue to use self-coping strategies to manage emotions Referral(s): [place] (In Clinic)  I discussed the assessment and treatment plan with the patient and/or parent/guardian. They were provided an opportunity to ask questions and all were answered. They agreed with the plan and demonstrated an understanding of the instructions.   They were advised to call back or seek an in-person evaluation if the symptoms worsen or if the condition fails to improve as anticipated.  Garlan Fair, LCSW     10/20/2022    2:40 PM 08/29/2022    8:53 AM 07/15/2022   10:27 AM 04/15/2022    8:41 AM 07/01/2021   11:02 AM  Depression screen PHQ 2/9  Decreased Interest 1 1 0 2 1  Down, Depressed, Hopeless '3 1 1 '$ 0 1  PHQ - 2 Score '4 2 1 2 2  '$ Altered sleeping 1 0 '1 3 2  '$ Tired, decreased  energy '3 3 1 '$ 3  2  Change in appetite '3 1 1 2 1  '$ Feeling bad or failure about yourself  2 1 0 1 0  Trouble concentrating 1 0 3 0 0  Moving slowly or fidgety/restless 0 0 0 3 0  Suicidal thoughts 0 0 0 0 0  PHQ-9 Score '14 7 7 14 7      '$ 10/20/2022    2:42 PM 08/29/2022    8:55 AM 07/15/2022   10:29 AM 04/15/2022    8:44 AM  GAD 7 : Generalized Anxiety Score  Nervous, Anxious, on Edge 1 0 1 0  Control/stop worrying 1 0 1 1  Worry too much - different things '3 1 3 1  '$ Trouble relaxing '3 3 3 3  '$ Restless '3 1 1 3  '$ Easily annoyed or irritable '3 3 3 3  '$ Afraid - awful might happen 1 0 3 2  Total GAD 7 Score '15 8 15 '$ 13

## 2022-11-26 ENCOUNTER — Ambulatory Visit: Payer: Medicaid Other | Admitting: Clinical

## 2022-11-26 DIAGNOSIS — F331 Major depressive disorder, recurrent, moderate: Secondary | ICD-10-CM

## 2022-11-26 DIAGNOSIS — F411 Generalized anxiety disorder: Secondary | ICD-10-CM

## 2022-11-26 DIAGNOSIS — Z658 Other specified problems related to psychosocial circumstances: Secondary | ICD-10-CM

## 2022-11-26 NOTE — Patient Instructions (Signed)
Center for [facility] Healthcare at [facility] for Women [place], [place] [address] [phone] (main office) [phone] ([place] office)  [place] Child Psychologist, counselling  (Childcare options, Early childcare development, etc.) [email or web address]  [place] [facility] Search Engine  [email or web address]

## 2022-12-03 ENCOUNTER — Encounter: Payer: Self-pay | Admitting: Nurse Practitioner
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
## 2022-12-15 NOTE — Patient Instructions (Signed)

## 2022-12-16 ENCOUNTER — Encounter: Payer: Self-pay | Admitting: Nurse Practitioner

## 2022-12-16 ENCOUNTER — Ambulatory Visit (INDEPENDENT_AMBULATORY_CARE_PROVIDER_SITE_OTHER): Payer: Medicaid Other | Admitting: Nurse Practitioner

## 2022-12-16 VITALS — BP 104/71 | HR 73 | Ht 61.5 in | Wt 240.6 lb

## 2022-12-16 DIAGNOSIS — Z794 Long term (current) use of insulin: Secondary | ICD-10-CM

## 2022-12-16 DIAGNOSIS — E1165 Type 2 diabetes mellitus with hyperglycemia: Secondary | ICD-10-CM

## 2022-12-16 LAB — POCT GLYCOSYLATED HEMOGLOBIN (HGB A1C): Hemoglobin A1C: 9.9 % — AB (ref 4.0–5.6)

## 2022-12-16 MED ORDER — PENTIPS 32G X 4 MM MISC: 6 refills | Status: DC

## 2022-12-16 MED ORDER — ACCU-CHEK GUIDE VI STRP: ORAL_STRIP | 12 refills | Status: DC

## 2022-12-16 MED ORDER — DEXCOM G7 SENSOR MISC: 1.0000 | 3 refills | Status: DC

## 2022-12-16 MED ORDER — INSULIN GLARGINE 100 UNIT/ML SOLOSTAR PEN: 60.0000 [IU] | PEN_INJECTOR | Freq: Every day | SUBCUTANEOUS | 3 refills | Status: DC

## 2022-12-16 MED ORDER — INSULIN LISPRO (1 UNIT DIAL) 100 UNIT/ML (KWIKPEN): 12.0000 [IU] | PEN_INJECTOR | Freq: Three times a day (TID) | SUBCUTANEOUS | 3 refills | Status: DC

## 2022-12-16 NOTE — Progress Notes (Signed)
Endocrinology Consult Note       12/16/2022, 10:35 AM   Subjective:    Patient ID: Claire Harrison, female    DOB: March 21, 1996.  Claire Harrison is being seen in consultation for management of currently uncontrolled symptomatic diabetes requested by  Coolidge Breeze, FNP.  She was previously seen in this clinic by Dr. Dorris Fetch in 2020 but disappeared from care.   Past Medical History:  Diagnosis Date   Anxiety    Asthma    Blood transfusion without reported diagnosis    Depression    Gestational diabetes    Migraine without aura and without status migrainosus, not intractable    Pre-eclampsia     Past Surgical History:  Procedure Laterality Date   CESAREAN SECTION     2018, 2020   CESAREAN SECTION N/A 12/10/2021   Procedure: CESAREAN SECTION;  Surgeon: Mora Bellman, MD;  Location: [facility];  Service: Obstetrics;  Laterality: N/A;   DILATION AND CURETTAGE OF UTERUS N/A 08/01/2016   Procedure: SUCTION DILATATION AND CURETTAGE;  Surgeon: Jonnie Kind, MD;  Location: [facility];  Service: Gynecology;  Laterality: N/A;    Social History   Socioeconomic History   Marital status: Married    Spouse name: Magdiel   Number of children: 3   Years of education: Not on file   Highest education level: Some college, no degree  Occupational History   Not on file  Tobacco Use   Smoking status: Never   Smokeless tobacco: Never  Vaping Use   Vaping Use: Never used  Substance and Sexual Activity   Alcohol use: No   Drug use: No   Sexual activity: Yes    Birth control/protection: Implant  Other Topics Concern   Not on file  Social History Narrative   Lives with family   L handed   Caffeine: 2-3 drinks a week    Social Determinants of Health   Financial Resource Strain: Medium Risk (01/14/2022)   Overall Financial Resource Strain (CARDIA)    Difficulty of Paying Living Expenses: Somewhat hard  Food  Insecurity: Food Insecurity Present (01/14/2022)   Hunger Vital Sign    Worried About Running Out of Food in the Last Year: Sometimes true    Ran Out of Food in the Last Year: Patient declined  Transportation Needs: No Transportation Needs (01/14/2022)   PRAPARE - Hydrologist (Medical): No    Lack of Transportation (Non-Medical): No  Physical Activity: Insufficiently Active (01/14/2022)   Exercise Vital Sign    Days of Exercise per Week: 2 days    Minutes of Exercise per Session: 10 min  Stress: Stress Concern Present (01/14/2022)   [place]    Feeling of Stress : Very much  Social Connections: Socially Integrated (01/14/2022)   Social Connection and Isolation Panel [NHANES]    Frequency of Communication with Friends and Family: More than three times a week    Frequency of Social Gatherings with Friends and Family: Three times a week    Attends Religious Services: 1 to 4 times per year    Active Member of Clubs or Organizations:  No    Attends Archivist Meetings: 1 to 4 times per year    Marital Status: Married    Family History  Problem Relation Age of Onset   Diabetes Mother    Hypertension Mother    Kidney failure Mother    Miscarriages / [place] Mother    Diabetes Father    Diabetes Sister    Miscarriages / [place] Sister    Diabetes Sister    Miscarriages / Stillbirths Sister    Scoliosis Brother    Heart murmur Daughter    Bronchitis Son    Colon cancer Maternal Aunt    Diabetes Maternal Grandmother    Kidney failure Maternal Grandmother     Outpatient Encounter Medications as of 12/16/2022  Medication Sig   ATROVENT HFA 17 MCG/ACT inhaler Inhale into the lungs.   cetirizine (ZYRTEC) 10 MG tablet Take 10 mg by mouth daily.   Continuous Blood Gluc Sensor (DEXCOM G7 SENSOR) MISC Inject 1 Application into the skin as directed. Change sensor every 10 days as  directed.   glucose blood (ACCU-CHEK GUIDE) test strip Use as instructed to monitor glucose 4 times daily   ibuprofen (ADVIL) 600 MG tablet Take 1 tablet (600 mg total) by mouth every 6 (six) hours.   Multiple Vitamin (MULTIVITAMIN) tablet Take 1 tablet by mouth daily.   NIFEdipine (ADALAT CC) 30 MG 24 hr tablet Take 1 tablet (30 mg total) by mouth daily.   [DISCONTINUED] insulin glargine (LANTUS) 100 UNIT/ML Solostar Pen Inject 40 Units into the skin at bedtime.   [DISCONTINUED] insulin lispro (HUMALOG KWIKPEN) 100 UNIT/ML KwikPen Inject 2 Units into the skin 3 (three) times daily.   [DISCONTINUED] Insulin Pen Needle (PENTIPS) 32G X 4 MM MISC Use as directed up to 4 times daily with lantus and humalog   insulin glargine (LANTUS) 100 UNIT/ML Solostar Pen Inject 60 Units into the skin at bedtime.   insulin lispro (HUMALOG KWIKPEN) 100 UNIT/ML KwikPen Inject 12-18 Units into the skin 3 (three) times daily.   Insulin Pen Needle (PENTIPS) 32G X 4 MM MISC Use as directed up to 4 times daily   Prenatal Vit-Fe Fumarate-FA (PRENATAL VITAMIN PO) Take by mouth daily.  (Patient not taking: Reported on 12/16/2022)   triamcinolone ointment (KENALOG) 0.1 % Apply 1 application. topically 2 (two) times daily. (Patient not taking: Reported on 12/16/2022)   No facility-administered encounter medications on file as of 12/16/2022.    ALLERGIES: Allergies  Allergen Reactions   Bee Venom Hives   Penicillins Anaphylaxis   Mango Flavor Hives and Swelling   Peanut-Containing Drug Products    Latex Rash    VACCINATION STATUS: Immunization History  Administered Date(s) Administered   Influenza,inj,Quad PF,6+ Mos 10/13/2016, 07/03/2020, 07/30/2021   Moderna Sars-Covid-2 Vaccination 03/05/2020, 04/02/2020, 11/19/2020   Tdap 10/01/2021    Diabetes She presents for her initial diabetic visit. She has type 2 diabetes mellitus. Onset time: diagnosed at age 28-gestational first. Hypoglycemia symptoms include  nervousness/anxiousness, sweats and tremors. Associated symptoms include blurred vision, fatigue, foot paresthesias, polydipsia and polyuria. There are no hypoglycemic complications. Symptoms are improving. Diabetic complications include peripheral neuropathy. Risk factors for coronary artery disease include diabetes mellitus, obesity, sedentary lifestyle and stress. Current diabetic treatment includes intensive insulin program. She is compliant with treatment most of the time. Her weight is fluctuating minimally. She is following a generally unhealthy diet. When asked about meal planning, she reported none. She has not had a previous visit with a dietitian. She participates  in exercise intermittently. (She presents today for her consultation with no meter or logs to review.  Her POCT A1c today is 9.9%, improving from last A1c of 10.1%.  She checks glucose 3-4 times per day depending on how she is feeling.  She drinks soda, juice, and water (reports she is cutting back on the sugary drinks now though).  She eats 2 meals per day, usually skipping lunch and will eat a snack every now and then.  She does report she remains active by walking and taking care of her livestock at the house.  She has eye appt coming up in October and has never been seen by podiatry in the past.  She is asking about GLP1 medications to aide in weight loss attempts, however she is currently breastfeeding. ) An ACE inhibitor/angiotensin II receptor blocker is not being taken. She does not see a podiatrist.Eye exam is not current.     Review of systems  Constitutional: + Minimally fluctuating body weight, current Body mass index is 44.72 kg/m., + fatigue, no subjective hyperthermia, no subjective hypothermia Eyes: + blurry vision, no xerophthalmia ENT: no sore throat, no nodules palpated in throat, no dysphagia/odynophagia, no hoarseness Cardiovascular: no chest pain, no shortness of breath, no palpitations, no leg  swelling Respiratory: no cough, no shortness of breath Gastrointestinal: no nausea/vomiting/diarrhea Musculoskeletal: no muscle/joint aches Skin: no rashes, no hyperemia Neurological: no tremors, + numbness/tingling to hands and feet, no dizziness Psychiatric: no depression, no anxiety  Objective:     BP 104/71 (BP Location: Left Arm, Patient Position: Sitting, Cuff Size: Large)   Pulse 73   Ht 5' 1.5" (1.562 m)   Wt 240 lb 9.6 oz (109.1 kg)   BMI 44.72 kg/m   Wt Readings from Last 3 Encounters:  12/16/22 240 lb 9.6 oz (109.1 kg)  02/21/22 237 lb 6.4 oz (107.7 kg)  01/14/22 238 lb (108 kg)     BP Readings from Last 3 Encounters:  12/16/22 104/71  02/21/22 113/75  01/14/22 96/65     Physical Exam- Limited  Constitutional:  Body mass index is 44.72 kg/m. , not in acute distress, normal state of mind Eyes:  EOMI, no exophthalmos Neck: Supple Musculoskeletal: no gross deformities, strength intact in all four extremities, no gross restriction of joint movements Skin:  no rashes, no hyperemia    CMP ( most recent) CMP     Component Value Date/Time   NA 136 12/10/2021 1616   NA 137 11/26/2021 1126   K 3.9 12/10/2021 1616   CL 107 12/10/2021 1616   CO2 21 (L) 12/10/2021 1616   GLUCOSE 221 (H) 12/10/2021 1616   BUN 11 12/10/2021 1616   BUN 9 11/26/2021 1126   CREATININE 0.63 12/10/2021 1616   CALCIUM 9.1 12/10/2021 1616   PROT 6.0 (L) 12/10/2021 1616   PROT 6.2 11/26/2021 1126   ALBUMIN 2.3 (L) 12/10/2021 1616   ALBUMIN 3.4 (L) 11/26/2021 1126   AST 18 12/10/2021 1616   ALT 15 12/10/2021 1616   ALKPHOS 115 12/10/2021 1616   BILITOT <0.1 (L) 12/10/2021 1616   BILITOT <0.2 11/26/2021 1126   GFRNONAA >60 12/10/2021 1616     Diabetic Labs (most recent): Lab Results  Component Value Date   HGBA1C 9.9 (A) 12/16/2022   HGBA1C 9.3 09/08/2018   HGBA1C 8.9 08/20/2018     Lipid Panel ( most recent) Lipid Panel  No results found for: "CHOL", "TRIG", "HDL",  "CHOLHDL", "VLDL", "LDLCALC", "LDLDIRECT", "LABVLDL"    Lab Results  Component Value Date   TSH 2.130 07/01/2021           Assessment & Plan:   1) Type 2 diabetes mellitus with hyperglycemia, with long-term current use of insulin ([place])  She presents today for her consultation with no meter or logs to review.  Her POCT A1c today is 9.9%, improving from last A1c of 10.1%.  She checks glucose 3-4 times per day depending on how she is feeling.  She drinks soda, juice, and water (reports she is cutting back on the sugary drinks now though).  She eats 2 meals per day, usually skipping lunch and will eat a snack every now and then.  She does report she remains active by walking and taking care of her livestock at the house.  She has eye appt coming up in October and has never been seen by podiatry in the past.  She is asking about GLP1 medications to aide in weight loss attempts, however she is currently breastfeeding.   - Claire Harrison has currently uncontrolled symptomatic type 2 DM since 27 years of age, with most recent A1c of 9.9 %.   -Recent labs reviewed.  - I had a long discussion with her about the progressive nature of diabetes and the pathology behind its complications. -her diabetes is not currently complicated but she remains at a high risk for more acute and chronic complications which include CAD, CVA, CKD, retinopathy, and neuropathy. These are all discussed in detail with her.  The following Lifestyle Medicine recommendations according to [facility]) were discussed and offered to patient and she agrees to start the journey:  A. Whole Foods, Plant-based plate comprising of fruits and vegetables, plant-based proteins, whole-grain carbohydrates was discussed in detail with the patient.   A list for source of those nutrients were also provided to the patient.  Patient will use only water or unsweetened tea for hydration. B.  The need to stay away  from risky substances including alcohol, smoking; obtaining 7 to 9 hours of restorative sleep, at least 150 minutes of moderate intensity exercise weekly, the importance of healthy social connections,  and stress reduction techniques were discussed. C.  A full color page of  Calorie density of various food groups per pound showing examples of each food groups was provided to the patient.  - I have counseled her on diet and weight management by adopting a carbohydrate restricted/protein rich diet. Patient is encouraged to switch to unprocessed or minimally processed complex starch and increased protein intake (animal or plant source), fruits, and vegetables. -  she is advised to stick to a routine mealtimes to eat 3 meals a day and avoid unnecessary snacks (to snack only to correct hypoglycemia).   - she acknowledges that there is a room for improvement in her food and drink choices. - Suggestion is made for her to avoid simple carbohydrates from her diet including Cakes, Sweet Desserts, Ice Cream, Soda (diet and regular), Sweet Tea, Candies, Chips, Cookies, Store Bought Juices, Alcohol in Excess of 1-2 drinks a day, Artificial Sweeteners, Coffee Creamer, and "Sugar-free" Products. This will help patient to have more stable blood glucose profile and potentially avoid unintended weight gain.  - I have approached her with the following individualized plan to manage her diabetes and patient agrees:   -She is advised to continue Lantus 60 units SQ nightly and adjust her Humalog to 12-18 units TID with meals if glucose is above 90 and she is eating (Specific  instructions on how to titrate insulin dosage based on glucose readings given to patient in writing).  She demonstrated her ability to properly use the SSI to dose meal time insulin with me today.  -she is encouraged to start monitoring glucose 4 times daily, before meals and before bed, to log their readings on the clinic sheets provided, and bring them  to review at follow up appointment in 4 weeks.  She could benefit from CGM device given her MDI.  I sent in script for Dexcom G7 sensors to her pharmacy.  - she is warned not to take insulin without proper monitoring per orders. - Adjustment parameters are given to her for hypo and hyperglycemia in writing. - she is encouraged to call clinic for blood glucose levels less than 70 or above 300 mg /dl.  - she will be considered for incretin therapy as appropriate next visit.  She is interested in GLP1 products to help her control weight, diabetes, and reduce cardiovascular risk but she is currently breastfeeding.  Once she has stopped breastfeeding we can look to add GLP1 product on so that we can scale back on insulin usage.  She denies any personal or family history of thyroid cancer, no history of pancreatitis.  - Specific targets for  A1c; LDL, HDL, and Triglycerides were discussed with the patient.  2) Blood Pressure /Hypertension:  her blood pressure is controlled to target.   she is advised to continue her current medications including Nifedipine 30 mg p.o. daily with breakfast.  3) Lipids/Hyperlipidemia:    There is no recent lipid panel to review, nor is she on any lipid lowering medications.  Will check lipid panel prior to next visit.  4)  Weight/Diet:  her Body mass index is 44.72 kg/m.  -  clearly complicating her diabetes care.   she is a candidate for weight loss. I discussed with her the fact that loss of 5 - 10% of her  current body weight will have the most impact on her diabetes management.  Exercise, and detailed carbohydrates information provided  -  detailed on discharge instructions.  5) Chronic Care/Health Maintenance: -she is not on ACEI/ARB or Statin medications and is encouraged to initiate and continue to follow up with Ophthalmology, Dentist, Podiatrist at least yearly or according to recommendations, and advised to stay away from smoking. I have recommended yearly flu  vaccine and pneumonia vaccine at least every 5 years; moderate intensity exercise for up to 150 minutes weekly; and sleep for at least 7 hours a day.  - she is advised to maintain close follow up with Coolidge Breeze, FNP for primary care needs, as well as her other providers for optimal and coordinated care.   - Time spent in this patient care: 60 min, of which > 50% was spent in counseling her about her diabetes and the rest reviewing her blood glucose logs, discussing her hypoglycemia and hyperglycemia episodes, reviewing her current and previous labs/studies (including abstraction from other facilities) and medications doses and developing a long term treatment plan based on the latest standards of care/guidelines; and documenting her care.    Please refer to Patient Instructions for Blood Glucose Monitoring and Insulin/Medications Dosing Guide" in media tab for additional information. Please also refer to "Patient Self Inventory" in the Media tab for reviewed elements of pertinent patient history.  [name] participated in the discussions, expressed understanding, and voiced agreement with the above plans.  All questions were answered to her satisfaction. she is  encouraged to contact clinic should she have any questions or concerns prior to her return visit.     Follow up plan: - Return in about 4 weeks (around 01/13/2023) for Diabetes F/U, Bring meter and logs, No previsit labs.    Rayetta Pigg, [facility] [address] [place],  [address] Phone: [phone] Fax: [phone]  12/16/2022, 10:35 AM

## 2022-12-22 ENCOUNTER — Ambulatory Visit (INDEPENDENT_AMBULATORY_CARE_PROVIDER_SITE_OTHER): Payer: Medicaid Other | Admitting: Clinical

## 2022-12-22 DIAGNOSIS — F331 Major depressive disorder, recurrent, moderate: Secondary | ICD-10-CM

## 2022-12-22 DIAGNOSIS — F411 Generalized anxiety disorder: Secondary | ICD-10-CM

## 2022-12-22 DIAGNOSIS — Z658 Other specified problems related to psychosocial circumstances: Secondary | ICD-10-CM

## 2022-12-29 NOTE — BH Specialist Note (Signed)
Pt did not arrive to video visit and did not answer the phone; Left HIPPA-compliant message to call back Ekansh Sherk from [facility] at  [phone] (Skylyn Slezak's office).  ?; left MyChart message for patient.  ? ?

## 2023-01-07 ENCOUNTER — Ambulatory Visit: Payer: Medicaid Other | Admitting: Clinical

## 2023-01-07 DIAGNOSIS — Z91199 Patient's noncompliance with other medical treatment and regimen due to unspecified reason: Secondary | ICD-10-CM

## 2023-01-13 ENCOUNTER — Ambulatory Visit: Payer: Medicaid Other | Admitting: Nurse Practitioner

## 2023-01-13 DIAGNOSIS — E1165 Type 2 diabetes mellitus with hyperglycemia: Secondary | ICD-10-CM

## 2023-01-14 NOTE — Patient Instructions (Signed)

## 2023-01-15 ENCOUNTER — Ambulatory Visit: Payer: Medicaid Other | Admitting: Nurse Practitioner

## 2023-01-15 ENCOUNTER — Encounter: Payer: Self-pay | Admitting: Nurse Practitioner

## 2023-01-15 ENCOUNTER — Telehealth: Payer: Self-pay | Admitting: Nurse Practitioner

## 2023-01-15 VITALS — BP 110/80 | HR 68 | Ht 61.5 in | Wt 242.0 lb

## 2023-01-15 DIAGNOSIS — E1165 Type 2 diabetes mellitus with hyperglycemia: Secondary | ICD-10-CM

## 2023-01-15 DIAGNOSIS — Z794 Long term (current) use of insulin: Secondary | ICD-10-CM

## 2023-01-15 LAB — POCT UA - MICROALBUMIN
Albumin/Creatinine Ratio, Urine, POC: >300
Creatinine, POC: 100 mg/dL
Microalbumin Ur, POC: 150 mg/L

## 2023-01-15 MED ORDER — DEXCOM G7 SENSOR MISC: 1.0000 | 3 refills | Status: DC

## 2023-01-15 MED ORDER — VITAMIN D (ERGOCALCIFEROL) 1.25 MG (50000 UNIT) PO CAPS: 50000.0000 [IU] | ORAL_CAPSULE | ORAL | 0 refills | Status: DC

## 2023-01-15 NOTE — Progress Notes (Signed)
Endocrinology Follow Up Note       01/15/2023, 9:28 AM   Subjective:    Patient ID: Claire Harrison, female    DOB: 12/31/1995.  Claire Harrison is being seen in follow up after being seen in consultation for management of currently uncontrolled symptomatic diabetes requested by  Wilmon Pali, FNP.  She was previously seen in this clinic by Dr. Fransico Him in 2020 but disappeared from care.   Past Medical History:  Diagnosis Date   Anxiety    Asthma    Blood transfusion without reported diagnosis    Depression    Gestational diabetes    Migraine without aura and without status migrainosus, not intractable    Pre-eclampsia     Past Surgical History:  Procedure Laterality Date   CESAREAN SECTION     2018, 2020   CESAREAN SECTION N/A 12/10/2021   Procedure: CESAREAN SECTION;  Surgeon: Catalina Antigua, MD;  Location: [facility];  Service: Obstetrics;  Laterality: N/A;   DILATION AND CURETTAGE OF UTERUS N/A 08/01/2016   Procedure: SUCTION DILATATION AND CURETTAGE;  Surgeon: Tilda Burrow, MD;  Location: [facility];  Service: Gynecology;  Laterality: N/A;    Social History   Socioeconomic History   Marital status: Married    Spouse name: Magdiel   Number of children: 3   Years of education: Not on file   Highest education level: Some college, no degree  Occupational History   Not on file  Tobacco Use   Smoking status: Never   Smokeless tobacco: Never  Vaping Use   Vaping Use: Never used  Substance and Sexual Activity   Alcohol use: No   Drug use: No   Sexual activity: Yes    Birth control/protection: Implant  Other Topics Concern   Not on file  Social History Narrative   Lives with family   L handed   Caffeine: 2-3 drinks a week    Social Determinants of Health   Financial Resource Strain: Medium Risk (01/14/2022)   Overall Financial Resource Strain (CARDIA)    Difficulty of Paying Living Expenses:  Somewhat hard  Food Insecurity: Food Insecurity Present (01/14/2022)   Hunger Vital Sign    Worried About Running Out of Food in the Last Year: Sometimes true    Ran Out of Food in the Last Year: Patient declined  Transportation Needs: No Transportation Needs (01/14/2022)   PRAPARE - Administrator, Civil Service (Medical): No    Lack of Transportation (Non-Medical): No  Physical Activity: Insufficiently Active (01/14/2022)   Exercise Vital Sign    Days of Exercise per Week: 2 days    Minutes of Exercise per Session: 10 min  Stress: Stress Concern Present (01/14/2022)   [employer] of Occupational Health - Occupational Stress Questionnaire    Feeling of Stress : Very much  Social Connections: Socially Integrated (01/14/2022)   Social Connection and Isolation Panel [NHANES]    Frequency of Communication with Friends and Family: More than three times a week    Frequency of Social Gatherings with Friends and Family: Three times a week    Attends Religious Services: 1 to 4 times per year  Active Member of Clubs or Organizations: No    Attends Banker Meetings: 1 to 4 times per year    Marital Status: Married    Family History  Problem Relation Age of Onset   Diabetes Mother    Hypertension Mother    Kidney failure Mother    Miscarriages / [place] Mother    Diabetes Father    Diabetes Sister    Miscarriages / [place] Sister    Diabetes Sister    Miscarriages / Stillbirths Sister    Scoliosis Brother    Heart murmur Daughter    Bronchitis Son    Colon cancer Maternal Aunt    Diabetes Maternal Grandmother    Kidney failure Maternal Grandmother     Outpatient Encounter Medications as of 01/15/2023  Medication Sig   ATROVENT HFA 17 MCG/ACT inhaler Inhale into the lungs.   cetirizine (ZYRTEC) 10 MG tablet Take 10 mg by mouth daily.   glucose blood (ACCU-CHEK GUIDE) test strip Use as instructed to monitor glucose 4 times daily   ibuprofen  (ADVIL) 600 MG tablet Take 1 tablet (600 mg total) by mouth every 6 (six) hours.   insulin glargine (LANTUS) 100 UNIT/ML Solostar Pen Inject 60 Units into the skin at bedtime.   insulin lispro (HUMALOG KWIKPEN) 100 UNIT/ML KwikPen Inject 12-18 Units into the skin 3 (three) times daily.   Insulin Pen Needle (PENTIPS) 32G X 4 MM MISC Use as directed up to 4 times daily   Multiple Vitamin (MULTIVITAMIN) tablet Take 1 tablet by mouth daily.   NIFEdipine (ADALAT CC) 30 MG 24 hr tablet Take 1 tablet (30 mg total) by mouth daily.   Vitamin D, Ergocalciferol, (DRISDOL) 1.25 MG (50000 UNIT) CAPS capsule Take 1 capsule (50,000 Units total) by mouth every 7 (seven) days.   Continuous Glucose Sensor (DEXCOM G7 SENSOR) MISC Inject 1 Application into the skin as directed. Change sensor every 10 days as directed.   Prenatal Vit-Fe Fumarate-FA (PRENATAL VITAMIN PO) Take by mouth daily.  (Patient not taking: Reported on 12/16/2022)   triamcinolone ointment (KENALOG) 0.1 % Apply 1 application. topically 2 (two) times daily. (Patient not taking: Reported on 12/16/2022)   [DISCONTINUED] Continuous Blood Gluc Sensor (DEXCOM G7 SENSOR) MISC Inject 1 Application into the skin as directed. Change sensor every 10 days as directed. (Patient not taking: Reported on 01/15/2023)   No facility-administered encounter medications on file as of 01/15/2023.    ALLERGIES: Allergies  Allergen Reactions   Bee Venom Hives   Penicillins Anaphylaxis   Mango Flavor Hives and Swelling   Peanut-Containing Drug Products    Latex Rash    VACCINATION STATUS: Immunization History  Administered Date(s) Administered   Influenza,inj,Quad PF,6+ Mos 10/13/2016, 07/03/2020, 07/30/2021   Moderna Sars-Covid-2 Vaccination 03/05/2020, 04/02/2020, 11/19/2020   Tdap 10/01/2021    Diabetes She presents for her follow-up diabetic visit. She has type 2 diabetes mellitus. Onset time: diagnosed at age 75-gestational first. Her disease course has  been improving. Hypoglycemia symptoms include nervousness/anxiousness, sweats and tremors. Associated symptoms include blurred vision, fatigue, foot paresthesias, polydipsia and polyuria. There are no hypoglycemic complications. Symptoms are stable. Diabetic complications include peripheral neuropathy. Risk factors for coronary artery disease include diabetes mellitus, obesity, sedentary lifestyle and stress. Current diabetic treatment includes intensive insulin program. She is compliant with treatment most of the time. Her weight is fluctuating minimally. She is following a generally unhealthy diet. When asked about meal planning, she reported none. She has not had a previous visit  with a dietitian. She participates in exercise intermittently. Her home blood glucose trend is decreasing steadily. (She presents today with her logs, no meter, showing improving glycemic profile.  She was not due for another A1c today.  Her logs show she has been injecting Humalog at bedtime as she misunderstood the directions.  She does have some mild hypoglycemia noted.  She continues to work on diet and lifestyle changes, does still drink juice on occasion.) An ACE inhibitor/angiotensin II receptor blocker is not being taken. She does not see a podiatrist.Eye exam is not current.     Review of systems  Constitutional: + increasing body weight, current Body mass index is 44.99 kg/m., + fatigue, no subjective hyperthermia, no subjective hypothermia, + frequent headaches Eyes: + blurry vision, no xerophthalmia ENT: no sore throat, no nodules palpated in throat, no dysphagia/odynophagia, no hoarseness Cardiovascular: no chest pain, no shortness of breath, no palpitations, no leg swelling Respiratory: no cough, no shortness of breath Genitourinary: + polyuria, vaginal itching Musculoskeletal: + foot pain (right foot arch area)- describes pain as electric Skin: no rashes, no hyperemia Neurological: no tremors, +  numbness/tingling to hands and feet, no dizziness Psychiatric: no depression, no anxiety  Objective:     BP 110/80 (BP Location: Left Arm, Patient Position: Sitting, Cuff Size: Large)   Pulse 68   Ht 5' 1.5" (1.562 m)   Wt 242 lb (109.8 kg)   BMI 44.99 kg/m   Wt Readings from Last 3 Encounters:  01/15/23 242 lb (109.8 kg)  12/16/22 240 lb 9.6 oz (109.1 kg)  02/21/22 237 lb 6.4 oz (107.7 kg)     BP Readings from Last 3 Encounters:  01/15/23 110/80  12/16/22 104/71  02/21/22 113/75     Physical Exam- Limited  Constitutional:  Body mass index is 44.99 kg/m. , not in acute distress, normal state of mind Eyes:  EOMI, no exophthalmos Neck: Supple Musculoskeletal: no gross deformities, strength intact in all four extremities, no gross restriction of joint movements Skin:  no rashes, no hyperemia  Diabetic Foot Exam - Simple   Simple Foot Form Diabetic Foot exam was performed with the following findings: Yes 01/15/2023  9:22 AM  Visual Inspection No deformities, no ulcerations, no other skin breakdown bilaterally: Yes Sensation Testing Intact to touch and monofilament testing bilaterally: Yes Pulse Check Posterior Tibialis and Dorsalis pulse intact bilaterally: Yes Comments     CMP ( most recent) CMP     Component Value Date/Time   NA 136 12/10/2021 1616   NA 137 11/26/2021 1126   K 3.9 12/10/2021 1616   CL 107 12/10/2021 1616   CO2 21 (L) 12/10/2021 1616   GLUCOSE 221 (H) 12/10/2021 1616   BUN 11 12/10/2021 1616   BUN 9 11/26/2021 1126   CREATININE 0.63 12/10/2021 1616   CALCIUM 9.1 12/10/2021 1616   PROT 6.0 (L) 12/10/2021 1616   PROT 6.2 11/26/2021 1126   ALBUMIN 2.3 (L) 12/10/2021 1616   ALBUMIN 3.4 (L) 11/26/2021 1126   AST 18 12/10/2021 1616   ALT 15 12/10/2021 1616   ALKPHOS 115 12/10/2021 1616   BILITOT <0.1 (L) 12/10/2021 1616   BILITOT <0.2 11/26/2021 1126   GFRNONAA >60 12/10/2021 1616     Diabetic Labs (most recent): Lab Results  Component  Value Date   HGBA1C 9.9 (A) 12/16/2022   HGBA1C 9.3 09/08/2018   HGBA1C 8.9 08/20/2018   MICROALBUR 150 mg/L 01/15/2023     Lipid Panel ( most recent) Lipid Panel  No results found for: "CHOL", "TRIG", "HDL", "CHOLHDL", "VLDL", "LDLCALC", "LDLDIRECT", "LABVLDL"    Lab Results  Component Value Date   TSH 2.130 07/01/2021           Assessment & Plan:   1) Type 2 diabetes mellitus with hyperglycemia, with long-term current use of insulin (HCC)  She presents today with her logs, no meter, showing improving glycemic profile.  She was not due for another A1c today.  Her logs show she has been injecting Humalog at bedtime as she misunderstood the directions.  She does have some mild hypoglycemia noted.  She continues to work on diet and lifestyle changes, does still drink juice on occasion.  She never heard about her CGM script sent at last visit.  - Claire Harrison has currently uncontrolled symptomatic type 2 DM since 27 years of age, with most recent A1c of 9.9 %.   -Recent labs reviewed.  Her POCT UM today shows mild microalbuminuria.  She does not some genitourinary concerns, I am having her reach out to PCP regarding this as her kidney function was normal when recently checked earlier this month.  - I had a long discussion with her about the progressive nature of diabetes and the pathology behind its complications. -her diabetes is not currently complicated but she remains at a high risk for more acute and chronic complications which include CAD, CVA, CKD, retinopathy, and neuropathy. These are all discussed in detail with her.  The following Lifestyle Medicine recommendations according to American College of Lifestyle Medicine [facility]) were discussed and offered to patient and she agrees to start the journey:  A. Whole Foods, Plant-based plate comprising of fruits and vegetables, plant-based proteins, whole-grain carbohydrates was discussed in detail with the patient.   A list for  source of those nutrients were also provided to the patient.  Patient will use only water or unsweetened tea for hydration. B.  The need to stay away from risky substances including alcohol, smoking; obtaining 7 to 9 hours of restorative sleep, at least 150 minutes of moderate intensity exercise weekly, the importance of healthy social connections,  and stress reduction techniques were discussed. C.  A full color page of  Calorie density of various food groups per pound showing examples of each food groups was provided to the patient.  - Nutritional counseling repeated at each appointment due to patients tendency to fall back in to old habits.  - The patient admits there is a room for improvement in their diet and drink choices. -  Suggestion is made for the patient to avoid simple carbohydrates from their diet including Cakes, Sweet Desserts / Pastries, Ice Cream, Soda (diet and regular), Sweet Tea, Candies, Chips, Cookies, Sweet Pastries, Store Bought Juices, Alcohol in Excess of 1-2 drinks a day, Artificial Sweeteners, Coffee Creamer, and "Sugar-free" Products. This will help patient to have stable blood glucose profile and potentially avoid unintended weight gain.   - I encouraged the patient to switch to unprocessed or minimally processed complex starch and increased protein intake (animal or plant source), fruits, and vegetables.   - Patient is advised to stick to a routine mealtimes to eat 3 meals a day and avoid unnecessary snacks (to snack only to correct hypoglycemia).  - I have approached her with the following individualized plan to manage her diabetes and patient agrees:   -She is advised to continue Lantus 60 units SQ nightly and adjust her Humalog to 12-18 units TID with meals if glucose is above 90  and she is eating (Specific instructions on how to titrate insulin dosage based on glucose readings given to patient in writing).  She is warned not to take Humalog at bedtime due to safety  concerns.  -she is encouraged to continue monitoring glucose 4 times daily, before meals and before bed, and to call the clinic if she has readings less than 70 or above 300 for 3 tests in a row.  I sent PA team message to see if they have received request for her Dexcom sensors.  - she is warned not to take insulin without proper monitoring per orders. - Adjustment parameters are given to her for hypo and hyperglycemia in writing.  - she will not be good candidate for GLP1 therapy as originally thought as she found out her aunt previously had thyroid cancer, making this too high risk for her.  - Specific targets for  A1c; LDL, HDL, and Triglycerides were discussed with the patient.  2) Blood Pressure /Hypertension:  her blood pressure is controlled to target.   she is advised to continue her current medications including Nifedipine 30 mg p.o. daily with breakfast.  3) Lipids/Hyperlipidemia:    Her recent lipid panel from 12/31/22 shows controlled LDL of 83 and slightly elevated triglycerides of 186.  She is not currently on any lipid lowering medications.  She is advised to adopt low fat diet and increase exercise when able.  4)  Weight/Diet:  her Body mass index is 44.99 kg/m.  -  clearly complicating her diabetes care.   she is a candidate for weight loss. I discussed with her the fact that loss of 5 - 10% of her  current body weight will have the most impact on her diabetes management.  Exercise, and detailed carbohydrates information provided  -  detailed on discharge instructions.  5) Vitamin D deficiency Her recent vitamin D level on 12/31/22 was low at 10.8.  She is not currently on any supplementation.  I discussed and initiated Ergocalciferol 50000 units po weekly x 12 weeks.  Then she can switch to OTC Vitamin D 3 5000 units daily as maintenance dose.  6) Chronic Care/Health Maintenance: -she is not on ACEI/ARB or Statin medications and is encouraged to initiate and continue to follow  up with Ophthalmology, Dentist, Podiatrist at least yearly or according to recommendations, and advised to stay away from smoking. I have recommended yearly flu vaccine and pneumonia vaccine at least every 5 years; moderate intensity exercise for up to 150 minutes weekly; and sleep for at least 7 hours a day.  - she is advised to maintain close follow up with Wilmon Pali, FNP for primary care needs, as well as her other providers for optimal and coordinated care.  I did encourage patient to follow up with PCP regarding headaches and genitourinary concerns.  She could benefit from podiatry evaluation too if her foot pain does not improve.     I spent  48  minutes in the care of the patient today including review of labs from CMP, Lipids, Thyroid Function, Hematology (current and previous including abstractions from other facilities); face-to-face time discussing  her blood glucose readings/logs, discussing hypoglycemia and hyperglycemia episodes and symptoms, medications doses, her options of short and long term treatment based on the latest standards of care / guidelines;  discussion about incorporating lifestyle medicine;  and documenting the encounter. Risk reduction counseling performed per USPSTF guidelines to reduce obesity and cardiovascular risk factors.     Please refer to  Patient Instructions for Blood Glucose Monitoring and Insulin/Medications Dosing Guide"  in media tab for additional information. Please  also refer to " Patient Self Inventory" in the Media  tab for reviewed elements of pertinent patient history.  Claire Harrison participated in the discussions, expressed understanding, and voiced agreement with the above plans.  All questions were answered to her satisfaction. she is encouraged to contact clinic should she have any questions or concerns prior to her return visit.     Follow up plan: - Return in about 3 months (around 04/16/2023) for Diabetes F/U with A1c in office,  No previsit labs, Bring meter and logs.    Ronny Bacon, [employer] [facility] [address] [place], [place] [address] Phone: [phone] Fax: [phone]  01/15/2023, 9:28 AM

## 2023-01-16 ENCOUNTER — Telehealth: Payer: Self-pay

## 2023-01-16 NOTE — Telephone Encounter (Signed)
Patient inquiring about CGM PA, has not heard about her original prescription.  Message sent to PA team to inquire.

## 2023-01-16 NOTE — Telephone Encounter (Signed)
Pharmacy Patient Advocate Encounter  Prior Authorization for [employer] has been approved by [employer]).    PA # [id number] Effective dates: 01/16/23 through 07/15/23

## 2023-01-16 NOTE — Telephone Encounter (Signed)
Patient Advocate Encounter   Received notification from pt msgs that prior authorization is required for [facility] G7 Sensor  Submitted: 01/16/23 Key [id number]  Status is pending

## 2023-01-19 NOTE — Telephone Encounter (Signed)
Patient was called and a message left that she had been approved.

## 2023-01-19 NOTE — Telephone Encounter (Signed)
Can you please let patient know she was approved?  Thanks

## 2023-01-20 ENCOUNTER — Telehealth: Payer: Self-pay | Admitting: *Deleted

## 2023-01-20 NOTE — Telephone Encounter (Signed)
Responding to the message that the patient had left with the [employer].  Patient had left the message that her new CGM was showing that her blood sugar was 180, she did a finger stick and it showed that it was 120. Her concern was that she may administer to much insulin.  Reviewed with Ronny Bacon, FNP- There is a 40 point difference between the CGM and the finger stick. This is considered normal. The patient would take her long acting insulin as prescribed and eat a snack , her short acting insulin take as she would for a meal coverage.  Patient was called and made aware.  She was also advised to call our office back with any questions and concerns that she may have.She voiced understanding.

## 2023-01-28 ENCOUNTER — Telehealth: Payer: Self-pay | Admitting: *Deleted

## 2023-01-28 MED ORDER — DEXCOM G7 RECEIVER DEVI: 1.0000 | Freq: Once | 0 refills | Status: AC

## 2023-01-28 NOTE — Telephone Encounter (Signed)
Patient was called and made aware. 

## 2023-01-28 NOTE — Telephone Encounter (Signed)
Ok, I sent for [employer] G7 receiver to [employer] for her.

## 2023-01-28 NOTE — Telephone Encounter (Signed)
Need a new Dexcom reader per her message.  The phone that she was using is broken , and her new phone id not accepting the Dexcom.

## 2023-02-10 ENCOUNTER — Telehealth: Payer: Self-pay | Admitting: Nurse Practitioner

## 2023-02-10 NOTE — Telephone Encounter (Signed)
Pt thinks she connected her dexcom to our clinic.  Can you check and see?

## 2023-02-10 NOTE — Telephone Encounter (Signed)
Sent pt my chart message to let her know

## 2023-02-10 NOTE — Telephone Encounter (Signed)
Yes, we are connected but I do not see any data since 02/05/23

## 2023-04-23 ENCOUNTER — Ambulatory Visit: Payer: Medicaid Other | Admitting: Nurse Practitioner

## 2023-04-23 DIAGNOSIS — Z794 Long term (current) use of insulin: Secondary | ICD-10-CM

## 2023-05-24 IMAGING — US US ABDOMEN LIMITED
1 series · 14 of 25 positions shown · non-contrast
Comparison: None.

CLINICAL DATA: Twenty[age]nine weeks pregnant with right upper quadrant
pain.

EXAM:
ULTRASOUND ABDOMEN LIMITED RIGHT UPPER QUADRANT

[Series 1: us abdomen limited · 0.21mm/px · 14 of 44 slices shown]
[im 1/44]
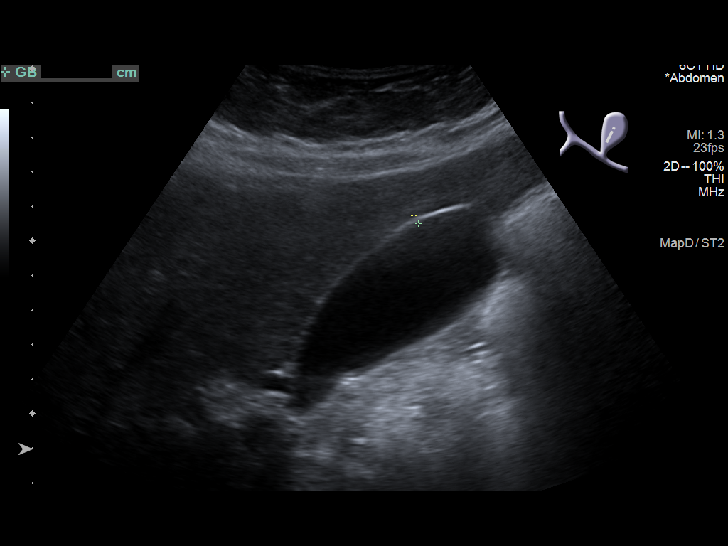
[im 4/44]
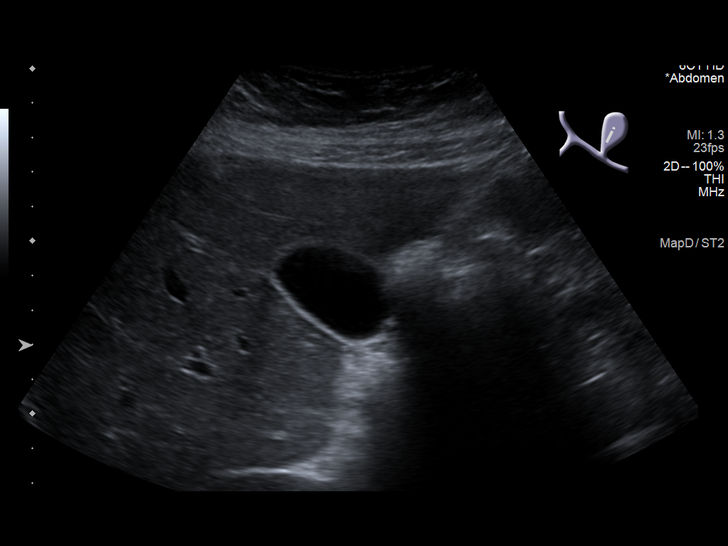
[im 8/44]
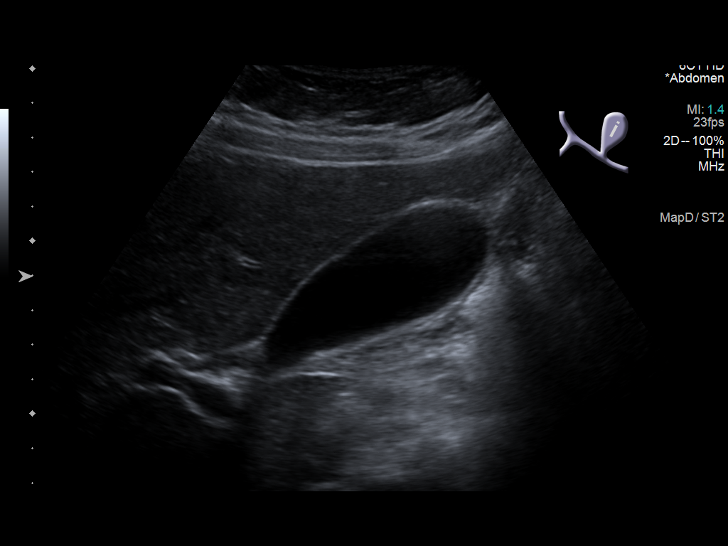
[im 11/44]
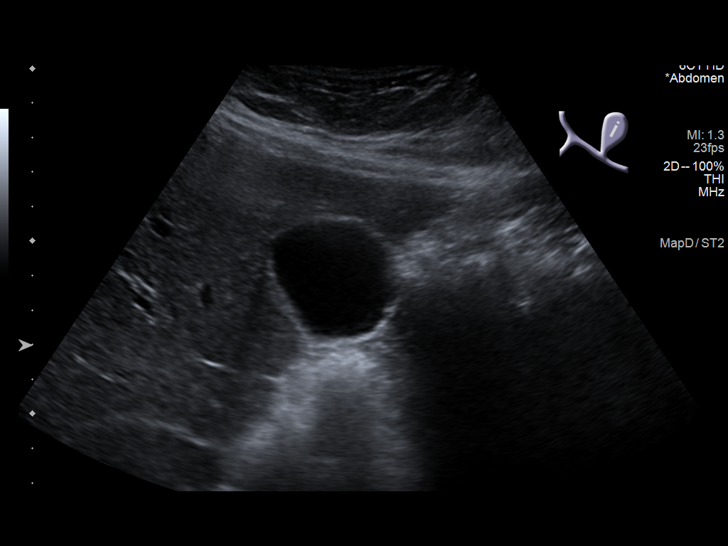
[im 15/44]
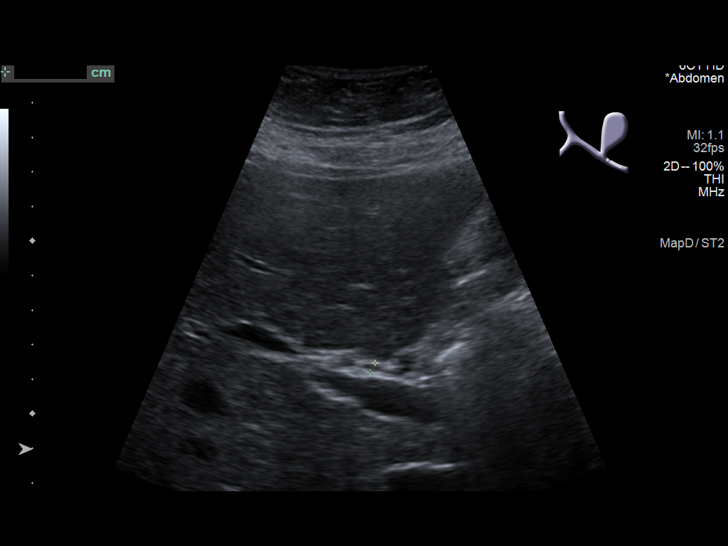
[im 17/44]
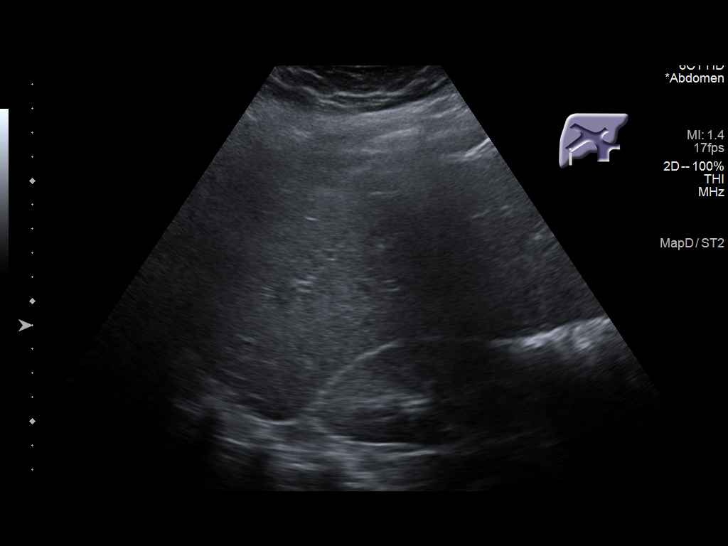
[im 20/44]
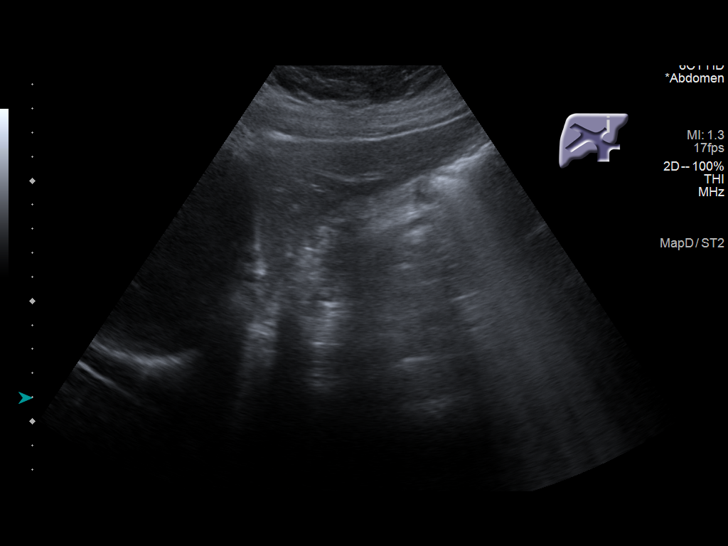
[im 24/44]
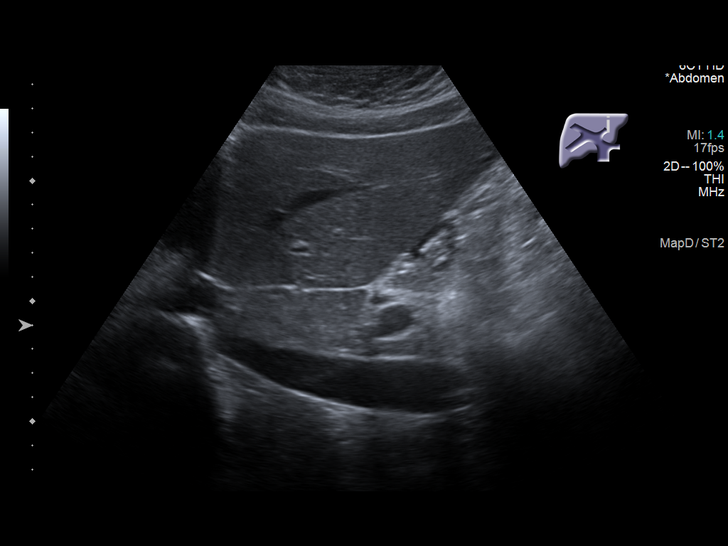
[im 27/44]
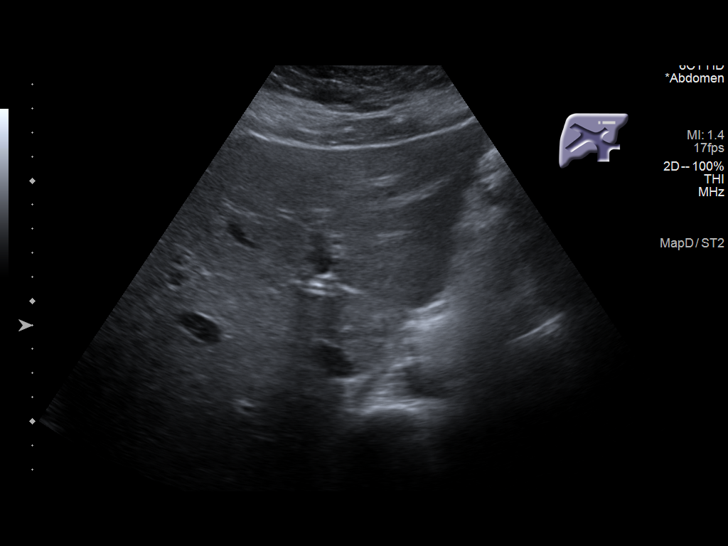
[im 29/44]
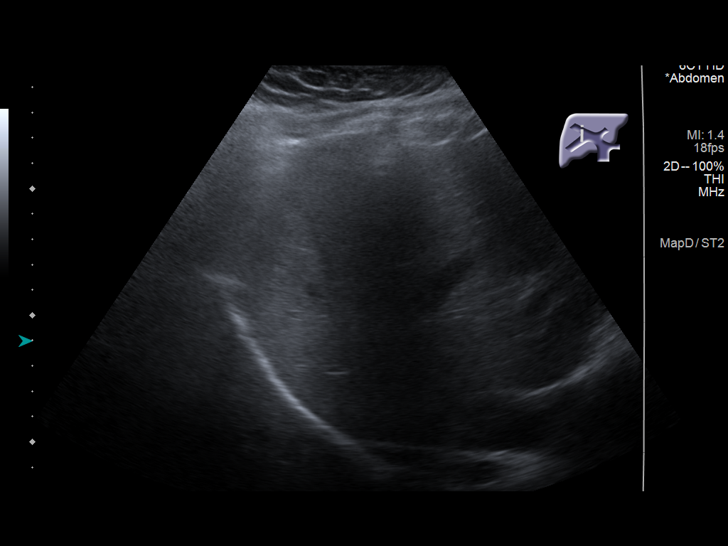
[im 33/44]
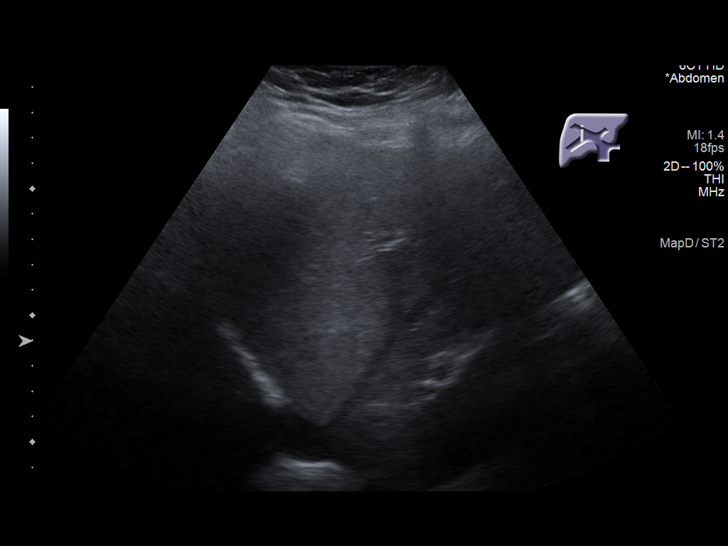
[im 36/44]
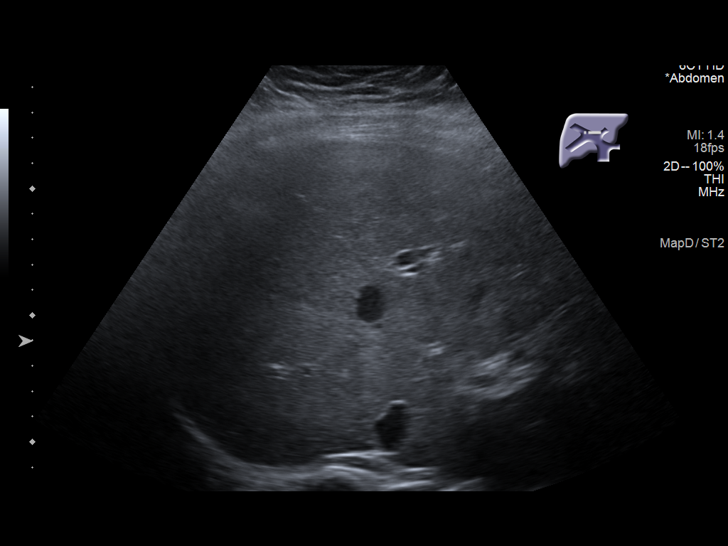
[im 40/44]
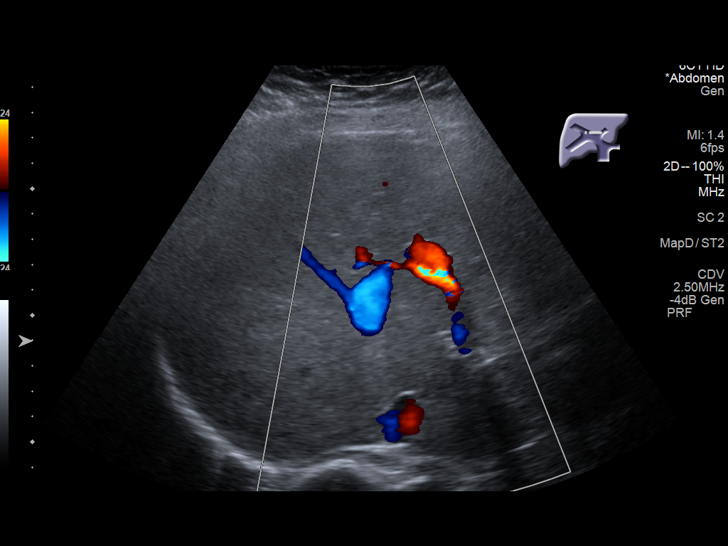
[im 44/44]
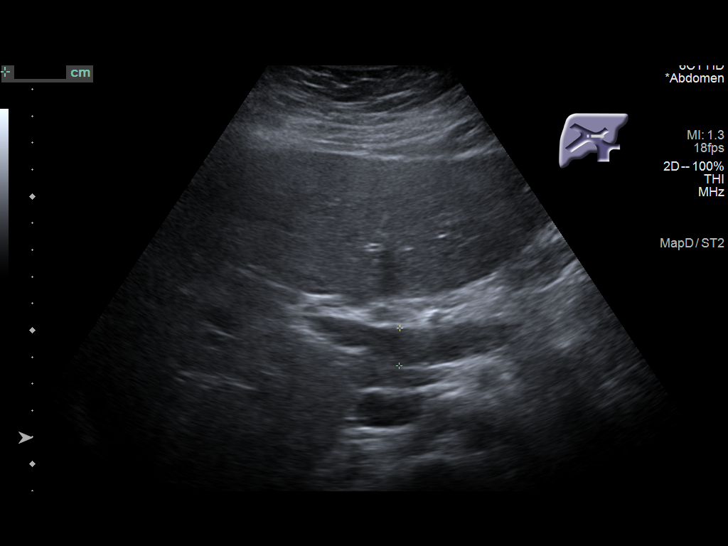

[14 of 25 positions shown; findings below may reference images not displayed]

FINDINGS: Gallbladder:

No gallstones or wall thickening visualized (2.6 mm). No sonographic
Murphy sign noted by sonographer.

Common bile duct:

Diameter: 3.0 mm)

Liver:

No focal lesion identified. Within normal limits in parenchymal
echogenicity. Portal vein is patent on color Doppler imaging with
normal direction of blood flow towards the liver.

Other: None.
IMPRESSION: Normal right upper quadrant ultrasound.

## 2023-06-18 ENCOUNTER — Encounter: Payer: Self-pay | Admitting: Nurse Practitioner

## 2023-06-18 ENCOUNTER — Ambulatory Visit: Payer: Medicaid Other | Admitting: Nurse Practitioner

## 2023-06-18 VITALS — BP 110/63 | HR 72 | Ht 61.5 in | Wt 256.8 lb

## 2023-06-18 DIAGNOSIS — Z794 Long term (current) use of insulin: Secondary | ICD-10-CM | POA: Diagnosis not present

## 2023-06-18 DIAGNOSIS — E1165 Type 2 diabetes mellitus with hyperglycemia: Secondary | ICD-10-CM | POA: Diagnosis not present

## 2023-06-18 DIAGNOSIS — E782 Mixed hyperlipidemia: Secondary | ICD-10-CM | POA: Diagnosis not present

## 2023-06-18 DIAGNOSIS — Z6841 Body Mass Index (BMI) 40.0 and over, adult: Secondary | ICD-10-CM

## 2023-06-18 LAB — POCT GLYCOSYLATED HEMOGLOBIN (HGB A1C): Hemoglobin A1C: 7.6 % — AB (ref 4.0–5.6)

## 2023-06-18 MED ORDER — INSULIN LISPRO (1 UNIT DIAL) 100 UNIT/ML (KWIKPEN): 14.0000 [IU] | PEN_INJECTOR | Freq: Three times a day (TID) | SUBCUTANEOUS | 3 refills | Status: DC

## 2023-06-18 MED ORDER — INSULIN GLARGINE 100 UNIT/ML SOLOSTAR PEN: 50.0000 [IU] | PEN_INJECTOR | Freq: Every day | SUBCUTANEOUS | 3 refills | Status: DC

## 2023-06-18 NOTE — Progress Notes (Signed)
Endocrinology Follow Up Note       06/18/2023, 10:56 AM   Subjective:    Patient ID: Claire Harrison, female    DOB: [birth year].  Claire Harrison is being seen in follow up after being seen in consultation for management of currently uncontrolled symptomatic diabetes requested by  Wilmon Pali, FNP.  She was previously seen in this clinic by Dr. Fransico Him in 2020 but disappeared from care.   Past Medical History:  Diagnosis Date   Anxiety    Asthma    Blood transfusion without reported diagnosis    Depression    Gestational diabetes    Migraine without aura and without status migrainosus, not intractable    Pre-eclampsia     Past Surgical History:  Procedure Laterality Date   CESAREAN SECTION     2018, 2020   CESAREAN SECTION N/A 12/10/2021   Procedure: CESAREAN SECTION;  Surgeon: Catalina Antigua, MD;  Location: [facility];  Service: Obstetrics;  Laterality: N/A;   DILATION AND CURETTAGE OF UTERUS N/A 08/01/2016   Procedure: SUCTION DILATATION AND CURETTAGE;  Surgeon: Tilda Burrow, MD;  Location: [facility];  Service: Gynecology;  Laterality: N/A;    Social History   Socioeconomic History   Marital status: Married    Spouse name: Magdiel   Number of children: 3   Years of education: Not on file   Highest education level: Some college, no degree  Occupational History   Not on file  Tobacco Use   Smoking status: Never   Smokeless tobacco: Never  Vaping Use   Vaping status: Never Used  Substance and Sexual Activity   Alcohol use: No   Drug use: No   Sexual activity: Yes    Birth control/protection: Implant  Other Topics Concern   Not on file  Social History Narrative   Lives with family   L handed   Caffeine: 2-3 drinks a week    Social Determinants of Health   Financial Resource Strain: Medium Risk (05/01/2022)   Received from [facility], [facility]   Overall Financial Resource  Strain (CARDIA)    Difficulty of Paying Living Expenses: Somewhat hard  Food Insecurity: No Food Insecurity (02/18/2022)   Received from [facility], [facility]   Hunger Vital Sign    Worried About Running Out of Food in the Last Year: Never true    Ran Out of Food in the Last Year: Never true  Recent Concern: Food Insecurity - Food Insecurity Present (01/14/2022)   Hunger Vital Sign    Worried About Running Out of Food in the Last Year: Sometimes true    Ran Out of Food in the Last Year: Patient declined  Transportation Needs: No Transportation Needs (01/20/2022)   Received from [facility], [facility]   PRAPARE - Transportation    Lack of Transportation (Medical): No    Lack of Transportation (Non-Medical): No  Physical Activity: Insufficiently Active (01/14/2022)   Exercise Vital Sign    Days of Exercise per Week: 2 days    Minutes of Exercise per Session: 10 min  Stress: Stress Concern Present (01/14/2022)   [employer] of Occupational  Health - Occupational Stress Questionnaire    Feeling of Stress : Very much  Social Connections: Socially Integrated (01/14/2022)   Social Connection and Isolation Panel [NHANES]    Frequency of Communication with Friends and Family: More than three times a week    Frequency of Social Gatherings with Friends and Family: Three times a week    Attends Religious Services: 1 to 4 times per year    Active Member of Clubs or Organizations: No    Attends Engineer, structural: 1 to 4 times per year    Marital Status: Married    Family History  Problem Relation Age of Onset   Diabetes Mother    Hypertension Mother    Kidney failure Mother    Miscarriages / [place] Mother    Diabetes Father    Diabetes Sister    Miscarriages / [place] Sister    Diabetes Sister    Miscarriages / Stillbirths Sister    Scoliosis Brother    Heart murmur Daughter    Bronchitis Son    Colon cancer Maternal Aunt    Diabetes Maternal  Grandmother    Kidney failure Maternal Grandmother     Outpatient Encounter Medications as of 06/18/2023  Medication Sig   ATROVENT HFA 17 MCG/ACT inhaler Inhale into the lungs.   cetirizine (ZYRTEC) 10 MG tablet Take 10 mg by mouth daily.   Continuous Glucose Sensor (DEXCOM G7 SENSOR) MISC Inject 1 Application into the skin as directed. Change sensor every 10 days as directed.   ELINEST 0.3-30 MG-MCG tablet Take 1 tablet by mouth daily.   etonogestrel-ethinyl estradiol (NUVARING) 0.12-0.015 MG/24HR vaginal ring Place 1 each vaginally every 28 (twenty-eight) days.   Fish Oil-Cholecalciferol (FISH OIL + D3 PO) Take by mouth. Patient states that she takes 2 by mouth daily   glucose blood (ACCU-CHEK GUIDE) test strip Use as instructed to monitor glucose 4 times daily   ibuprofen (ADVIL) 600 MG tablet Take 1 tablet (600 mg total) by mouth every 6 (six) hours.   Insulin Pen Needle (PENTIPS) 32G X 4 MM MISC Use as directed up to 4 times daily   Multiple Vitamin (MULTI-DAY PO) Take by mouth. Patient states that she takes 2 by mouth daily   Multiple Vitamin (MULTIVITAMIN) tablet Take 1 tablet by mouth daily.   NIFEdipine (ADALAT CC) 30 MG 24 hr tablet Take 1 tablet (30 mg total) by mouth daily.   [DISCONTINUED] insulin glargine (LANTUS) 100 UNIT/ML Solostar Pen Inject 60 Units into the skin at bedtime.   [DISCONTINUED] insulin lispro (HUMALOG KWIKPEN) 100 UNIT/ML KwikPen Inject 12-18 Units into the skin 3 (three) times daily.   insulin glargine (LANTUS) 100 UNIT/ML Solostar Pen Inject 50 Units into the skin at bedtime.   insulin lispro (HUMALOG KWIKPEN) 100 UNIT/ML KwikPen Inject 14-20 Units into the skin 3 (three) times daily.   Prenatal Vit-Fe Fumarate-FA (PRENATAL VITAMIN PO) Take by mouth daily.  (Patient not taking: Reported on 12/16/2022)   triamcinolone ointment (KENALOG) 0.1 % Apply 1 application. topically 2 (two) times daily. (Patient not taking: Reported on 12/16/2022)   Vitamin D,  Ergocalciferol, (DRISDOL) 1.25 MG (50000 UNIT) CAPS capsule Take 1 capsule (50,000 Units total) by mouth every 7 (seven) days. (Patient not taking: Reported on 06/18/2023)   No facility-administered encounter medications on file as of 06/18/2023.    ALLERGIES: Allergies  Allergen Reactions   Bee Venom Hives   Penicillins Anaphylaxis   Mango Flavor Hives and Swelling   Peanut-Containing Drug Products  Latex Rash    VACCINATION STATUS: Immunization History  Administered Date(s) Administered   Influenza,inj,Quad PF,6+ Mos 10/13/2016, 07/03/2020, 07/30/2021   Moderna Sars-Covid-2 Vaccination 03/05/2020, 04/02/2020, 11/19/2020   Tdap 10/01/2021    Diabetes She presents for her follow-up diabetic visit. She has type 2 diabetes mellitus. Onset time: diagnosed at age 24-gestational first. Her disease course has been improving. Hypoglycemia symptoms include nervousness/anxiousness, sweats and tremors. Associated symptoms include blurred vision, fatigue, foot paresthesias and visual change (seeing floaters). Pertinent negatives for diabetes include no polydipsia and no polyuria. Hypoglycemia complications include required assistance (EMS was called for low event in the 40s). Symptoms are improving. Diabetic complications include peripheral neuropathy. Risk factors for coronary artery disease include diabetes mellitus, obesity, sedentary lifestyle and stress. Current diabetic treatment includes intensive insulin program. She is compliant with treatment most of the time. Her weight is increasing steadily. She is following a generally unhealthy diet. When asked about meal planning, she reported none. She has not had a previous visit with a dietitian. She participates in exercise intermittently. Her home blood glucose trend is decreasing steadily. Her overall blood glucose range is >200 mg/dl. (She presents today with her CGM showing improving glycemic profile overall.  Her POCT A1c today is 7.6%,  improving greatly from last A1c of 9.9%.  Analysis of her CGM shows TIR 36%, TAR 64%, TBR 0% with a GMI of 8.2%.  She recalls an episode where glucose dropped into the 40s at night, she had symptoms, and did call EMS for treatment.    She has multiple complaints today including frequent headaches (seeing neurology for this and was supposed to be getting ?botox injections to help but she did not go through with it as she was scared), numbness/tingling in hands which is affecting her ability to perform everyday tasks, recurrent yeast infections, and increasing body weight (she is asking for medication to help her lose weight).) An ACE inhibitor/angiotensin II receptor blocker is not being taken. She does not see a podiatrist.Eye exam is not current.     Review of systems  Constitutional: + increasing body weight, current Body mass index is 47.74 kg/m., + fatigue, no subjective hyperthermia, no subjective hypothermia, + frequent headaches Eyes: + blurry vision, no xerophthalmia, + floaters (seeing eye doctor in Oct) ENT: no sore throat, no nodules palpated in throat, no dysphagia/odynophagia, no hoarseness Cardiovascular: no chest pain, no shortness of breath, no palpitations, no leg swelling Respiratory: no cough, no shortness of breath Genitourinary: + vaginal itching Musculoskeletal: + foot pain (right foot arch area)- describes pain as electric Skin: no rashes, no hyperemia Neurological: no tremors, + numbness/tingling to hands and feet, no dizziness Psychiatric: no depression, no anxiety  Objective:     BP 110/63 (BP Location: Right Arm, Patient Position: Sitting, Cuff Size: Large)   Pulse 72   Ht 5' 1.5" (1.562 m)   Wt 256 lb 12.8 oz (116.5 kg)   BMI 47.74 kg/m   Wt Readings from Last 3 Encounters:  06/18/23 256 lb 12.8 oz (116.5 kg)  01/15/23 242 lb (109.8 kg)  12/16/22 240 lb 9.6 oz (109.1 kg)     BP Readings from Last 3 Encounters:  06/18/23 110/63  01/15/23 110/80   12/16/22 104/71     Physical Exam- Limited  Constitutional:  Body mass index is 47.74 kg/m. , not in acute distress, normal state of mind Eyes:  EOMI, no exophthalmos Neck: Supple Musculoskeletal: no gross deformities, strength intact in all four extremities, no gross restriction of  joint movements Skin:  no rashes, no hyperemia  Diabetic Foot Exam - Simple   No data filed     CMP ( most recent) CMP     Component Value Date/Time   NA 136 12/10/2021 1616   NA 137 11/26/2021 1126   K 3.9 12/10/2021 1616   CL 107 12/10/2021 1616   CO2 21 (L) 12/10/2021 1616   GLUCOSE 221 (H) 12/10/2021 1616   BUN 11 12/10/2021 1616   BUN 9 11/26/2021 1126   CREATININE 0.63 12/10/2021 1616   CALCIUM 9.1 12/10/2021 1616   PROT 6.0 (L) 12/10/2021 1616   PROT 6.2 11/26/2021 1126   ALBUMIN 2.3 (L) 12/10/2021 1616   ALBUMIN 3.4 (L) 11/26/2021 1126   AST 18 12/10/2021 1616   ALT 15 12/10/2021 1616   ALKPHOS 115 12/10/2021 1616   BILITOT <0.1 (L) 12/10/2021 1616   BILITOT <0.2 11/26/2021 1126   GFRNONAA >60 12/10/2021 1616     Diabetic Labs (most recent): Lab Results  Component Value Date   HGBA1C 7.6 (A) 06/18/2023   HGBA1C 9.9 (A) 12/16/2022   HGBA1C 9.3 09/08/2018   MICROALBUR 150 mg/L 01/15/2023     Lipid Panel ( most recent) Lipid Panel  No results found for: "CHOL", "TRIG", "HDL", "CHOLHDL", "VLDL", "LDLCALC", "LDLDIRECT", "LABVLDL"    Lab Results  Component Value Date   TSH 2.130 07/01/2021           Assessment & Plan:   1) Type 2 diabetes mellitus with hyperglycemia, with long-term current use of insulin (HCC)  She presents today with her CGM showing improving glycemic profile overall.  Her POCT A1c today is 7.6%, improving greatly from last A1c of 9.9%.  Analysis of her CGM shows TIR 36%, TAR 64%, TBR 0% with a GMI of 8.2%.  She recalls an episode where glucose dropped into the 40s at night, she had symptoms, and did call EMS for treatment.    She has multiple  complaints today including frequent headaches (seeing neurology for this and was supposed to be getting ?botox injections to help but she did not go through with it as she was scared), numbness/tingling in hands which is affecting her ability to perform everyday tasks, recurrent yeast infections, and increasing body weight (she is asking for medication to help her lose weight).  When asked about what she ate yesterday, she recalled she had chicken breast and french fries (shared plate with her child) and drank a [place]. Dew.  - Claire Harrison has currently uncontrolled symptomatic type 2 DM since 27 years of age.   -Recent labs reviewed.    - I had a long discussion with her about the progressive nature of diabetes and the pathology behind its complications. -her diabetes is not currently complicated but she remains at a high risk for more acute and chronic complications which include CAD, CVA, CKD, retinopathy, and neuropathy. These are all discussed in detail with her.  The following Lifestyle Medicine recommendations according to American College of Lifestyle Medicine [facility]) were discussed and offered to patient and she agrees to start the journey:  A. Whole Foods, Plant-based plate comprising of fruits and vegetables, plant-based proteins, whole-grain carbohydrates was discussed in detail with the patient.   A list for source of those nutrients were also provided to the patient.  Patient will use only water or unsweetened tea for hydration. B.  The need to stay away from risky substances including alcohol, smoking; obtaining 7 to 9 hours of restorative sleep, at  least 150 minutes of moderate intensity exercise weekly, the importance of healthy social connections,  and stress reduction techniques were discussed. C.  A full color page of  Calorie density of various food groups per pound showing examples of each food groups was provided to the patient.  - Nutritional counseling repeated at each  appointment due to patients tendency to fall back in to old habits.  - The patient admits there is a room for improvement in their diet and drink choices. -  Suggestion is made for the patient to avoid simple carbohydrates from their diet including Cakes, Sweet Desserts / Pastries, Ice Cream, Soda (diet and regular), Sweet Tea, Candies, Chips, Cookies, Sweet Pastries, Store Bought Juices, Alcohol in Excess of 1-2 drinks a day, Artificial Sweeteners, Coffee Creamer, and "Sugar-free" Products. This will help patient to have stable blood glucose profile and potentially avoid unintended weight gain.   - I encouraged the patient to switch to unprocessed or minimally processed complex starch and increased protein intake (animal or plant source), fruits, and vegetables.   - Patient is advised to stick to a routine mealtimes to eat 3 meals a day and avoid unnecessary snacks (to snack only to correct hypoglycemia).  - I have approached her with the following individualized plan to manage her diabetes and patient agrees:   -She is advised to lower Lantus to 50 units SQ nightly and adjust her Humalog to 14-20 units TID with meals if glucose is above 90 and she is eating (Specific instructions on how to titrate insulin dosage based on glucose readings given to patient in writing).  She is warned not to take Humalog at bedtime due to safety concerns.  -she is encouraged to continue monitoring glucose 4 times daily (using her CGM), before meals and before bed, and to call the clinic if she has readings less than 70 or above 300 for 3 tests in a row.    - she is warned not to take insulin without proper monitoring per orders. - Adjustment parameters are given to her for hypo and hyperglycemia in writing.  - she will not be good candidate for GLP1 therapy as originally thought as she found out her aunt previously had thyroid cancer, making this too high risk for her.  - Specific targets for  A1c; LDL, HDL, and  Triglycerides were discussed with the patient.  2) Blood Pressure /Hypertension:  her blood pressure is controlled to target.   she is advised to continue her current medications including Nifedipine 30 mg p.o. daily with breakfast.  3) Lipids/Hyperlipidemia:    Her recent lipid panel from 12/31/22 shows controlled LDL of 83 and slightly elevated triglycerides of 186.  She is not currently on any lipid lowering medications.  She is advised to adopt low fat diet and increase exercise when able.  4)  Weight/Diet:  her Body mass index is 47.74 kg/m.  -  clearly complicating her diabetes care.   she is a candidate for weight loss. I discussed with her the fact that loss of 5 - 10% of her  current body weight will have the most impact on her diabetes management.  Exercise, and detailed carbohydrates information provided  -  detailed on discharge instructions.  She asked about other weight loss medications today including GLP1 product (which we have not used due to positive thyroid cancer family history).  I do not consider her a good candidate for Phentermine either due to frequent headaches.  She asked about referral for  bariatric surgery and I did initiate that for her today.  I also recommended she keep a food diary for [place] to review at her next visit.  5) Vitamin D deficiency Her recent vitamin D level on 12/31/22 was low at 10.8.  She is not currently on any supplementation.  I discussed and initiated Ergocalciferol 50000 units po weekly x 12 weeks.  Then she can switch to OTC Vitamin D 3 5000 units daily as maintenance dose.  6) Chronic Care/Health Maintenance: -she is not on ACEI/ARB or Statin medications and is encouraged to initiate and continue to follow up with Ophthalmology, Dentist, Podiatrist at least yearly or according to recommendations, and advised to stay away from smoking. I have recommended yearly flu vaccine and pneumonia vaccine at least every 5 years; moderate intensity exercise for up  to 150 minutes weekly; and sleep for at least 7 hours a day.  - she is advised to maintain close follow up with Wilmon Pali, FNP for primary care needs, as well as her other providers for optimal and coordinated care.  I did encourage patient to follow up with PCP regarding headaches and genitourinary concerns.       I spent  46  minutes in the care of the patient today including review of labs from CMP, Lipids, Thyroid Function, Hematology (current and previous including abstractions from other facilities); face-to-face time discussing  her blood glucose readings/logs, discussing hypoglycemia and hyperglycemia episodes and symptoms, medications doses, her options of short and long term treatment based on the latest standards of care / guidelines;  discussion about incorporating lifestyle medicine;  and documenting the encounter. Risk reduction counseling performed per USPSTF guidelines to reduce obesity and cardiovascular risk factors.     Please refer to Patient Instructions for Blood Glucose Monitoring and Insulin/Medications Dosing Guide"  in media tab for additional information. Please  also refer to " Patient Self Inventory" in the Media  tab for reviewed elements of pertinent patient history.  Claire Harrison participated in the discussions, expressed understanding, and voiced agreement with the above plans.  All questions were answered to her satisfaction. she is encouraged to contact clinic should she have any questions or concerns prior to her return visit.     Follow up plan: - Return in about 3 months (around 09/17/2023) for Diabetes F/U with A1c in office, No previsit labs, Bring meter and logs.   Ronny Bacon, [facility] [address] [place], [place] [address] Phone: [phone] Fax: [phone]  06/18/2023, 10:56 AM

## 2023-06-22 ENCOUNTER — Telehealth: Payer: Self-pay | Admitting: Nurse Practitioner

## 2023-06-22 NOTE — Telephone Encounter (Signed)
Received a message from [facility] - referral denied for bariatric surgery due to they do not take stand alone medicaid.

## 2023-06-24 NOTE — Telephone Encounter (Signed)
Pt called and wanted to know about this so I relayed the message. She is asking if you can refer her somewhere else or what her next steps are

## 2023-06-24 NOTE — Telephone Encounter (Signed)
Unfortunately, I do not know of any other option at this point.  I know she had a friend that went to [facility] for her weight loss surgery so maybe they would take Medicaid?  But I suspect it is simply not a covered benefit under Medicaid.

## 2023-06-25 NOTE — Telephone Encounter (Signed)
Pt called and said that in her mychart it says that she is approved with [facility] and states that she is not sure why we are telling her she is not approved. I advised pt that we received a letter stating that she was not approved due to her having stand alone medicaid. I told her to call her insurance and the surgery center.

## 2023-06-25 NOTE — Telephone Encounter (Signed)
Noted, thanks.  I dont see anything in her chart saying she was approved.

## 2023-06-29 ENCOUNTER — Telehealth: Payer: Self-pay | Admitting: Nurse Practitioner

## 2023-06-29 DIAGNOSIS — Z794 Long term (current) use of insulin: Secondary | ICD-10-CM

## 2023-06-29 NOTE — Telephone Encounter (Signed)
[facility] stated that she was not approved and told her to reach back out to you to be referred somewhere else.

## 2023-06-29 NOTE — Telephone Encounter (Signed)
Let me loop in Sherrill as she may know of a place that takes Medicaid for bariatric weight loss surgery.

## 2023-06-30 NOTE — Telephone Encounter (Signed)
Thank you Debbe Odea!  I just put in a referral to their office for her.  Nicholos Johns, will you let her know I have re-routed her referral to the clinic above?  Thanks

## 2023-07-21 ENCOUNTER — Other Ambulatory Visit (HOSPITAL_COMMUNITY): Payer: Self-pay

## 2023-07-21 ENCOUNTER — Telehealth: Payer: Self-pay

## 2023-07-21 NOTE — Telephone Encounter (Signed)
Pharmacy Patient Advocate Encounter   Received notification from CoverMyMeds that prior authorization for Dexcom G7 sensor is required/requested.   Per test claim: PA required; PA submitted to [facility] via CoverMyMeds Key/confirmation #/EOC [id number] Status is pending

## 2023-07-24 NOTE — Telephone Encounter (Signed)
Patient was called and made aware. 

## 2023-07-24 NOTE — Telephone Encounter (Signed)
Pharmacy Patient Advocate Encounter  Received notification from [facility] that Prior Authorization for Dexcom G7 sensor has been APPROVED through 07/19/2024   PA #/Case ID/Reference #: [id number]

## 2023-08-20 ENCOUNTER — Telehealth: Payer: Self-pay | Admitting: Nurse Practitioner

## 2023-08-20 NOTE — Telephone Encounter (Signed)
I just pulled her CGM data.  She has been bottoming out at night so I would urge her NOT to take more Lantus than what was prescribed (50 units- 60 units TOPS).  I do see some spikes during the day but it looks like those are improving somewhat too.  Is she still taking steroids?

## 2023-08-20 NOTE — Telephone Encounter (Addendum)
Pt said her sugar has been high due to being sick and steroids. She said to pull her readings please because she has been using more insulin and would like it increased

## 2023-09-28 ENCOUNTER — Telehealth: Payer: Self-pay | Admitting: *Deleted

## 2023-09-28 NOTE — Telephone Encounter (Signed)
Pt stated she has been out of her Lantus x 2-3 weeks but was seen at Urgent Care Friday and Rx was refilled. States she has been injecting a "whole pen a day" !/2 in the morning and the other !/2 in the evening. States she has been injecting Humalog SS 15-30 units tid with meals but has not been eating much. Pt has an appt for 10/05/23 but requested an earlier appt to discuss treatment of her diabetes. CGM data printed and given to Whitney Reardon,FNP for evaluation.

## 2023-09-28 NOTE — Telephone Encounter (Signed)
Patient called the after hours over the weekend. She shared that she was out of her Lantus and has been using Humalog. She has been having high blood sugars that are out of control. Patient was called and ask about her readings for the las 4 to 5 days. Patient also shares with the after hour nurse that she may be pregnant but that it was to early to test.  When I called, received a voicemail leaving a message asking that that she call out office back.

## 2023-09-28 NOTE — Telephone Encounter (Signed)
Yes, I agree she needs to be seen soon to readjust.  We have an opening tomorrow she can be scheduled for.

## 2023-09-28 NOTE — Telephone Encounter (Signed)
Spoke with pt, she has been scheduled for 10/30/23 at 3:30.

## 2023-09-29 ENCOUNTER — Ambulatory Visit: Payer: Medicaid Other | Admitting: Nurse Practitioner

## 2023-09-29 ENCOUNTER — Encounter: Payer: Self-pay | Admitting: Nurse Practitioner

## 2023-09-29 VITALS — BP 116/74 | HR 82 | Ht 62.0 in | Wt 254.8 lb

## 2023-09-29 DIAGNOSIS — E1165 Type 2 diabetes mellitus with hyperglycemia: Secondary | ICD-10-CM

## 2023-09-29 DIAGNOSIS — I10 Essential (primary) hypertension: Secondary | ICD-10-CM

## 2023-09-29 DIAGNOSIS — Z794 Long term (current) use of insulin: Secondary | ICD-10-CM

## 2023-09-29 DIAGNOSIS — E782 Mixed hyperlipidemia: Secondary | ICD-10-CM

## 2023-09-29 DIAGNOSIS — E66813 Obesity, class 3: Secondary | ICD-10-CM

## 2023-09-29 DIAGNOSIS — Z6841 Body Mass Index (BMI) 40.0 and over, adult: Secondary | ICD-10-CM

## 2023-09-29 DIAGNOSIS — E559 Vitamin D deficiency, unspecified: Secondary | ICD-10-CM

## 2023-09-29 LAB — POCT GLYCOSYLATED HEMOGLOBIN (HGB A1C): Hemoglobin A1C: 8 % — AB (ref 4.0–5.6)

## 2023-09-29 MED ORDER — INSULIN GLARGINE 100 UNIT/ML SOLOSTAR PEN: 60.0000 [IU] | PEN_INJECTOR | Freq: Every day | SUBCUTANEOUS | 3 refills | Status: DC

## 2023-09-29 MED ORDER — INSULIN LISPRO (1 UNIT DIAL) 100 UNIT/ML (KWIKPEN): 16.0000 [IU] | PEN_INJECTOR | Freq: Three times a day (TID) | SUBCUTANEOUS | 3 refills | Status: DC

## 2023-09-29 MED ORDER — PENTIPS 32G X 4 MM MISC: 6 refills | Status: DC

## 2023-09-29 MED ORDER — DEXCOM G7 SENSOR MISC: 1.0000 | 3 refills | Status: DC

## 2023-09-29 NOTE — Progress Notes (Signed)
 Endocrinology Follow Up Note       09/29/2023, 3:28 PM   Subjective:    Patient ID: Claire Harrison, female    DOB: Dec 24, 1995.  Claire Harrison is being seen in follow up after being seen in consultation for management of currently uncontrolled symptomatic diabetes requested by  Gerome Tillman CROME, FNP.  She was previously seen in this clinic by Dr. Lenis in 2020 but disappeared from care.   Past Medical History:  Diagnosis Date   Anxiety    Asthma    Blood transfusion without reported diagnosis    Depression    Gestational diabetes    Migraine without aura and without status migrainosus, not intractable    Pre-eclampsia     Past Surgical History:  Procedure Laterality Date   CESAREAN SECTION     2018, 2020   CESAREAN SECTION N/A 12/10/2021   Procedure: CESAREAN SECTION;  Surgeon: Alger Gong, MD;  Location: [facility];  Service: Obstetrics;  Laterality: N/A;   DILATION AND CURETTAGE OF UTERUS N/A 08/01/2016   Procedure: SUCTION DILATATION AND CURETTAGE;  Surgeon: Norleen LULLA Server, MD;  Location: [facility];  Service: Gynecology;  Laterality: N/A;    Social History   Socioeconomic History   Marital status: Married    Spouse name: Magdiel   Number of children: 3   Years of education: Not on file   Highest education level: Some college, no degree  Occupational History   Not on file  Tobacco Use   Smoking status: Never   Smokeless tobacco: Never  Vaping Use   Vaping status: Never Used  Substance and Sexual Activity   Alcohol use: No   Drug use: No   Sexual activity: Yes    Birth control/protection: Implant  Other Topics Concern   Not on file  Social History Narrative   Lives with family   L handed   Caffeine : 2-3 drinks a week    Social Drivers of Corporate Investment Banker Strain: Medium Risk (05/01/2022)   Received from [facility], [facility]   Overall Financial Resource Strain  (CARDIA)    Difficulty of Paying Living Expenses: Somewhat hard  Food Insecurity: No Food Insecurity (02/18/2022)   Received from [facility], [facility]   Hunger Vital Sign    Worried About Running Out of Food in the Last Year: Never true    Ran Out of Food in the Last Year: Never true  Recent Concern: Food Insecurity - Food Insecurity Present (01/14/2022)   Hunger Vital Sign    Worried About Running Out of Food in the Last Year: Sometimes true    Ran Out of Food in the Last Year: Patient declined  Transportation Needs: No Transportation Needs (01/20/2022)   Received from [facility], [facility]   PRAPARE - Transportation    Lack of Transportation (Medical): No    Lack of Transportation (Non-Medical): No  Physical Activity: Insufficiently Active (01/14/2022)   Exercise Vital Sign    Days of Exercise per Week: 2 days    Minutes of Exercise per Session: 10 min  Stress: Stress Concern Present (01/14/2022)   [employer] of Occupational  Health - Occupational Stress Questionnaire    Feeling of Stress : Very much  Social Connections: Socially Integrated (01/14/2022)   Social Connection and Isolation Panel [NHANES]    Frequency of Communication with Friends and Family: More than three times a week    Frequency of Social Gatherings with Friends and Family: Three times a week    Attends Religious Services: 1 to 4 times per year    Active Member of Clubs or Organizations: No    Attends Engineer, Structural: 1 to 4 times per year    Marital Status: Married    Family History  Problem Relation Age of Onset   Diabetes Mother    Hypertension Mother    Kidney failure Mother    Miscarriages / Stillbirths Mother    Diabetes Father    Diabetes Sister    Miscarriages / Stillbirths Sister    Diabetes Sister    Miscarriages / Stillbirths Sister    Scoliosis Brother    Heart murmur Daughter    Bronchitis Son    Colon cancer Maternal Aunt    Diabetes Maternal  Grandmother    Kidney failure Maternal Grandmother     Outpatient Encounter Medications as of 09/29/2023  Medication Sig   ATROVENT HFA 17 MCG/ACT inhaler Inhale into the lungs.   cetirizine (ZYRTEC) 10 MG tablet Take 10 mg by mouth daily.   ELINEST 0.3-30 MG-MCG tablet Take 1 tablet by mouth daily.   etonogestrel -ethinyl estradiol (NUVARING) 0.12-0.015 MG/24HR vaginal ring Place 1 each vaginally every 28 (twenty-eight) days.   Fish Oil-Cholecalciferol (FISH OIL + D3 PO) Take by mouth. Patient states that she takes 2 by mouth daily   glucose blood (ACCU-CHEK GUIDE) test strip Use as instructed to monitor glucose 4 times daily   ibuprofen  (ADVIL ) 600 MG tablet Take 1 tablet (600 mg total) by mouth every 6 (six) hours.   Multiple Vitamin (MULTI-DAY PO) Take by mouth. Patient states that she takes 2 by mouth daily   Multiple Vitamin (MULTIVITAMIN) tablet Take 1 tablet by mouth daily.   NIFEdipine  (ADALAT  CC) 30 MG 24 hr tablet Take 1 tablet (30 mg total) by mouth daily.   [DISCONTINUED] Continuous Glucose Sensor (DEXCOM G7 SENSOR) MISC Inject 1 Application into the skin as directed. Change sensor every 10 days as directed.   [DISCONTINUED] insulin  glargine (LANTUS ) 100 UNIT/ML Solostar Pen Inject 50 Units into the skin at bedtime.   [DISCONTINUED] insulin  lispro (HUMALOG  KWIKPEN) 100 UNIT/ML KwikPen Inject 14-20 Units into the skin 3 (three) times daily.   [DISCONTINUED] Insulin  Pen Needle (PENTIPS) 32G X 4 MM MISC Use as directed up to 4 times daily   Continuous Glucose Sensor (DEXCOM G7 SENSOR) MISC Inject 1 Application into the skin as directed. Change sensor every 10 days as directed.   insulin  glargine (LANTUS ) 100 UNIT/ML Solostar Pen Inject 60 Units into the skin at bedtime.   insulin  lispro (HUMALOG  KWIKPEN) 100 UNIT/ML KwikPen Inject 16-22 Units into the skin 3 (three) times daily.   Insulin  Pen Needle (PENTIPS) 32G X 4 MM MISC Use as directed up to 4 times daily   Prenatal Vit-Fe  Fumarate-FA (PRENATAL VITAMIN PO) Take by mouth daily.  (Patient not taking: Reported on 12/16/2022)   triamcinolone ointment (KENALOG) 0.1 % Apply 1 application. topically 2 (two) times daily. (Patient not taking: Reported on 09/29/2023)   Vitamin D , Ergocalciferol , (DRISDOL ) 1.25 MG (50000 UNIT) CAPS capsule Take 1 capsule (50,000 Units total) by mouth every 7 (seven) days. (Patient  not taking: Reported on 09/29/2023)   No facility-administered encounter medications on file as of 09/29/2023.    ALLERGIES: Allergies  Allergen Reactions   Bee Venom Hives   Penicillins Anaphylaxis   Mango Flavoring Agent (Non-Screening) Hives and Swelling   Peanut-Containing Drug Products    Latex Rash    VACCINATION STATUS: Immunization History  Administered Date(s) Administered   Influenza,inj,Quad PF,6+ Mos 10/13/2016, 07/03/2020, 07/30/2021   Moderna Sars-Covid-2 Vaccination 03/05/2020, 04/02/2020, 11/19/2020   Tdap 10/01/2021    Diabetes She presents for her follow-up diabetic visit. She has type 2 diabetes mellitus. Onset time: diagnosed at age 42-gestational first. Her disease course has been fluctuating. Hypoglycemia symptoms include nervousness/anxiousness, sweats and tremors. Associated symptoms include blurred vision, fatigue, foot paresthesias, visual change (seeing floaters) and weight loss. Pertinent negatives for diabetes include no polydipsia and no polyuria. Hypoglycemia complications include required assistance (EMS was called for low event in the 40s). Symptoms are improving. Diabetic complications include peripheral neuropathy. Risk factors for coronary artery disease include diabetes mellitus, obesity, sedentary lifestyle and stress. Current diabetic treatment includes intensive insulin  program. She is compliant with treatment most of the time. She is following a generally unhealthy diet. When asked about meal planning, she reported none. She has not had a previous visit with a dietitian.  She participates in exercise intermittently. Her home blood glucose trend is fluctuating dramatically. Her overall blood glucose range is >200 mg/dl. (She presents today with her CGM showing dramatically fluctuating glycemic profile.  Her POCT A1c today is 8%, increasing from last visit of 7.6%.  She notes she recently ran out of Lantus  2-3 weeks ago and finally had to go back to urgent care to get it refilled.  She notes she had been taking 100 units of Lantus  in the morning and another 100 units in the evening to help get her glucose back on track.  She also notes she has increased her Humalog  to 20 units TID with meals on her own.  This is making her run out earlier and making it hard to get her insulin  from the pharmacy.  Analysis of her CGM shows TIR 29%, TAR 71%, TBR <1% with a GMI of 8.8%.  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 She does not see a podiatrist.Eye exam is not current.     Review of systems  Constitutional: + stable body weight, current Body mass index is 46.6 kg/m., + fatigue, no subjective hyperthermia, no subjective hypothermia, + frequent headaches Eyes: + blurry vision, no xerophthalmia ENT: no sore throat, no nodules palpated in throat, no dysphagia/odynophagia, no hoarseness Cardiovascular: no chest pain, no shortness of breath, no palpitations, no leg swelling Respiratory: no cough, no shortness of breath Genitourinary: + vaginal itching Musculoskeletal: + foot pain (right foot arch area)- describes pain as electric Skin: no rashes, no hyperemia Neurological: no tremors, + numbness/tingling to hands and feet, no dizziness Psychiatric: no depression, no anxiety  Objective:     BP 116/74 (BP Location: Left Arm, Patient Position: Sitting, Cuff Size: Large)   Pulse 82   Ht 5' 2 (1.575 m)   Wt 254 lb 12.8 oz (115.6 kg)   BMI 46.60 kg/m   Wt Readings from  Last 3 Encounters:  09/29/23 254 lb 12.8 oz (115.6 kg)  06/18/23 256 lb 12.8 oz (116.5 kg)  01/15/23 242 lb (109.8 kg)     BP Readings from  Last 3 Encounters:  09/29/23 116/74  06/18/23 110/63  01/15/23 110/80      Physical Exam- Limited  Constitutional:  Body mass index is 46.6 kg/m. , not in acute distress, normal state of mind Eyes:  EOMI, no exophthalmos Musculoskeletal: no gross deformities, strength intact in all four extremities, no gross restriction of joint movements Skin:  no rashes, no hyperemia Neurological: no tremor with outstretched hands  Diabetic Foot Exam - Simple   No data filed     CMP ( most recent) CMP     Component Value Date/Time   NA 136 12/10/2021 1616   NA 137 11/26/2021 1126   K 3.9 12/10/2021 1616   CL 107 12/10/2021 1616   CO2 21 (L) 12/10/2021 1616   GLUCOSE 221 (H) 12/10/2021 1616   BUN 11 12/10/2021 1616   BUN 9 11/26/2021 1126   CREATININE 0.63 12/10/2021 1616   CALCIUM 9.1 12/10/2021 1616   PROT 6.0 (L) 12/10/2021 1616   PROT 6.2 11/26/2021 1126   ALBUMIN 2.3 (L) 12/10/2021 1616   ALBUMIN 3.4 (L) 11/26/2021 1126   AST 18 12/10/2021 1616   ALT 15 12/10/2021 1616   ALKPHOS 115 12/10/2021 1616   BILITOT <0.1 (L) 12/10/2021 1616   BILITOT <0.2 11/26/2021 1126   GFRNONAA >60 12/10/2021 1616     Diabetic Labs (most recent): Lab Results  Component Value Date   HGBA1C 8.0 (A) 09/29/2023   HGBA1C 7.6 (A) 06/18/2023   HGBA1C 9.9 (A) 12/16/2022   MICROALBUR 150 mg/L 01/15/2023     Lipid Panel ( most recent) Lipid Panel  No results found for: CHOL, TRIG, HDL, CHOLHDL, VLDL, LDLCALC, LDLDIRECT, LABVLDL    Lab Results  Component Value Date   TSH 2.130 07/01/2021           Assessment & Plan:   1) Type 2 diabetes mellitus with hyperglycemia, with long-term current use of insulin  (HCC)  She presents today with her CGM showing dramatically fluctuating glycemic profile.  Her POCT A1c today is 8%,  increasing from last visit of 7.6%.  She notes she recently ran out of Lantus  2-3 weeks ago and finally had to go back to urgent care to get it refilled.  She notes she had been taking 100 units of Lantus  in the morning and another 100 units in the evening to help get her glucose back on track.  She also notes she has increased her Humalog  to 20 units TID with meals on her own.  This is making her run out earlier and making it hard to get her insulin  from the pharmacy.  Analysis of her CGM shows TIR 29%, TAR 71%, TBR <1% with a GMI of 8.8%.  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  - Claire Harrison has currently uncontrolled symptomatic type 2 DM since 27 years of age.   -Recent labs reviewed.    - I had a long discussion with her about the progressive nature of diabetes and the pathology behind its complications. -her diabetes is not currently complicated but she remains at a high risk for more acute and chronic complications which include CAD, CVA, CKD, retinopathy, and neuropathy. These are all discussed in detail with her.  The following Lifestyle Medicine recommendations according to American College of Lifestyle Medicine [facility]) were discussed and offered to patient and she agrees to start the journey:  A. Whole Foods, Plant-based plate comprising of fruits and vegetables, plant-based proteins, whole-grain carbohydrates  was discussed in detail with the patient.   A list for source of those nutrients were also provided to the patient.  Patient will use only water or unsweetened tea for hydration. B.  The need to stay away from risky substances including alcohol, smoking; obtaining 7 to 9 hours of restorative sleep, at least 150 minutes of moderate intensity exercise weekly, the importance of healthy social connections,  and stress reduction techniques were discussed. C.  A full color page of  Calorie density of various food groups per pound showing examples  of each food groups was provided to the patient.  - Nutritional counseling repeated at each appointment due to patients tendency to fall back in to old habits.  - The patient admits there is a room for improvement in their diet and drink choices. -  Suggestion is made for the patient to avoid simple carbohydrates from their diet including Cakes, Sweet Desserts / Pastries, Ice Cream, Soda (diet and regular), Sweet Tea, Candies, Chips, Cookies, Sweet Pastries, Store Bought Juices, Alcohol in Excess of 1-2 drinks a day, Artificial Sweeteners, Coffee Creamer, and Sugar-free Products. This will help patient to have stable blood glucose profile and potentially avoid unintended weight gain.   - I encouraged the patient to switch to unprocessed or minimally processed complex starch and increased protein intake (animal or plant source), fruits, and vegetables.   - Patient is advised to stick to a routine mealtimes to eat 3 meals a day and avoid unnecessary snacks (to snack only to correct hypoglycemia).  - I have approached her with the following individualized plan to manage her diabetes and patient agrees:    -I strongly advised her against changing insulin  doses on her own.  We discussed the importance of this again today.  -She is advised to lower Lantus  to 60 units SQ nightly and adjust her Humalog  to 16-22 units TID with meals if glucose is above 90 and she is eating (Specific instructions on how to titrate insulin  dosage based on glucose readings given to patient in writing).    -she is encouraged to continue monitoring glucose 4 times daily (using her CGM), before meals and before bed, and to call the clinic if she has readings less than 70 or above 300 for 3 tests in a row.    - she is warned not to take insulin  without proper monitoring per orders. - Adjustment parameters are given to her for hypo and hyperglycemia in writing.  - she will not be good candidate for GLP1 therapy as originally  thought as she found out her aunt previously had thyroid cancer, making this too high risk for her.  She asked about Trulicity today which is in this same class.    -She did not go through with the bariatric weight loss surgery, as she wanted to get better control of her glucose first.  - Specific targets for  A1c; LDL, HDL, and Triglycerides were discussed with the patient.  2) Blood Pressure /Hypertension:  her blood pressure is controlled to target.   she is advised to continue her current medications including Nifedipine  30 mg p.o. daily with breakfast.  3) Lipids/Hyperlipidemia:    Her recent lipid panel from 12/31/22 shows controlled LDL of 83 and slightly elevated triglycerides of 186.  She is not currently on any lipid lowering medications.  She is advised to adopt low fat diet and increase exercise when able.  4)  Weight/Diet:  her Body mass index is 46.6 kg/m.  -  clearly complicating her diabetes care.   she is a candidate for weight loss. I discussed with her the fact that loss of 5 - 10% of her  current body weight will have the most impact on her diabetes management.  Exercise, and detailed carbohydrates information provided  -  detailed on discharge instructions.  She asked about other weight loss medications today including GLP1 product (which we have not used due to positive thyroid cancer family history).  I do not consider her a good candidate for Phentermine either due to frequent headaches.  She asked about referral for bariatric surgery and I did initiate that for her today.  I also recommended she keep a food diary for us  to review at her next visit.  5) Vitamin D  deficiency Her recent vitamin D  level on 12/31/22 was low at 10.8.  She is not currently on any supplementation.  I discussed and initiated Ergocalciferol  50000 units po weekly x 12 weeks.  Then she can switch to OTC Vitamin D  3 5000 units daily as maintenance dose.  6) Chronic Care/Health Maintenance: -she is not on  ACEI/ARB or Statin medications and is encouraged to initiate and continue to follow up with Ophthalmology, Dentist, Podiatrist at least yearly or according to recommendations, and advised to stay away from smoking. I have recommended yearly flu vaccine and pneumonia vaccine at least every 5 years; moderate intensity exercise for up to 150 minutes weekly; and sleep for at least 7 hours a day.  - she is advised to maintain close follow up with Gerome Tillman CROME, FNP for primary care needs, as well as her other providers for optimal and coordinated care.       I spent  55  minutes in the care of the patient today including review of labs from CMP, Lipids, Thyroid Function, Hematology (current and previous including abstractions from other facilities); face-to-face time discussing  her blood glucose readings/logs, discussing hypoglycemia and hyperglycemia episodes and symptoms, medications doses, her options of short and long term treatment based on the latest standards of care / guidelines;  discussion about incorporating lifestyle medicine;  and documenting the encounter. Risk reduction counseling performed per USPSTF guidelines to reduce obesity and cardiovascular risk factors.     Please refer to Patient Instructions for Blood Glucose Monitoring and Insulin /Medications Dosing Guide  in media tab for additional information. Please  also refer to  Patient Self Inventory in the Media  tab for reviewed elements of pertinent patient history.  Claire Harrison participated in the discussions, expressed understanding, and voiced agreement with the above plans.  All questions were answered to her satisfaction. she is encouraged to contact clinic should she have any questions or concerns prior to her return visit.     Follow up plan: - Return in about 3 months (around 12/28/2023) for Diabetes F/U with A1c in office, No previsit labs, Bring meter and logs.   Benton Rio, [employer] [facility]  [facility] [address] [place], [place] [address] Phone: [phone] Fax: [phone]  09/29/2023, 3:28 PM

## 2023-10-05 ENCOUNTER — Ambulatory Visit: Payer: Medicaid Other | Admitting: Nurse Practitioner

## 2023-11-20 ENCOUNTER — Telehealth: Payer: Self-pay | Admitting: Nurse Practitioner

## 2023-11-20 DIAGNOSIS — E1165 Type 2 diabetes mellitus with hyperglycemia: Secondary | ICD-10-CM

## 2023-11-20 MED ORDER — INSULIN GLARGINE 100 UNIT/ML SOLOSTAR PEN: 60.0000 [IU] | PEN_INJECTOR | Freq: Every day | SUBCUTANEOUS | 0 refills | Status: DC

## 2023-11-20 NOTE — Telephone Encounter (Signed)
Rx refill for Lantus 60 units at bedtime 90 day supply sent to [facility] in [place].

## 2023-11-20 NOTE — Telephone Encounter (Signed)
Pt states she needs a new RX for 60 units for Lantus sent to [facility]

## 2023-12-07 ENCOUNTER — Other Ambulatory Visit: Payer: Self-pay | Admitting: *Deleted

## 2023-12-07 ENCOUNTER — Telehealth: Payer: Self-pay | Admitting: Nurse Practitioner

## 2023-12-07 DIAGNOSIS — E1165 Type 2 diabetes mellitus with hyperglycemia: Secondary | ICD-10-CM

## 2023-12-07 MED ORDER — INSULIN GLARGINE 100 UNIT/ML SOLOSTAR PEN: 60.0000 [IU] | PEN_INJECTOR | Freq: Every day | SUBCUTANEOUS | 0 refills | Status: DC

## 2023-12-07 NOTE — Telephone Encounter (Signed)
 Patient is requesting a refill on her Lantus.  [facility]

## 2023-12-07 NOTE — Telephone Encounter (Signed)
A refill has been sent in.  

## 2023-12-09 ENCOUNTER — Ambulatory Visit (INDEPENDENT_AMBULATORY_CARE_PROVIDER_SITE_OTHER): Admitting: *Deleted

## 2023-12-09 ENCOUNTER — Encounter: Payer: Self-pay | Admitting: *Deleted

## 2023-12-09 ENCOUNTER — Other Ambulatory Visit: Payer: Self-pay | Admitting: Adult Health

## 2023-12-09 VITALS — BP 111/72 | HR 69 | Ht 62.0 in | Wt 242.6 lb

## 2023-12-09 DIAGNOSIS — Z3201 Encounter for pregnancy test, result positive: Secondary | ICD-10-CM | POA: Diagnosis not present

## 2023-12-09 LAB — POCT URINE PREGNANCY: Preg Test, Ur: POSITIVE — AB

## 2023-12-09 MED ORDER — ONDANSETRON HCL 4 MG PO TABS: 4.0000 mg | ORAL_TABLET | Freq: Three times a day (TID) | ORAL | 1 refills | Status: DC | PRN

## 2023-12-09 NOTE — Progress Notes (Signed)
 Rx zofran

## 2023-12-09 NOTE — Progress Notes (Signed)
   NURSE VISIT- PREGNANCY CONFIRMATION   SUBJECTIVE:  Claire Harrison is a 28 y.o. [phone] female at [redacted]w[redacted]d by certain LMP of Patient's last menstrual period was 11/02/2023 (exact date). Here for pregnancy confirmation.  Home pregnancy test: positive x 4   She reports no complaints.  She is taking prenatal vitamins.    OBJECTIVE:  BP 111/72 (BP Location: Right Arm, Patient Position: Sitting, Cuff Size: Large)   Pulse 69   Ht 5\' 2"  (1.575 m)   Wt 242 lb 9.6 oz (110 kg)   LMP 11/02/2023 (Exact Date)   Breastfeeding No   BMI 44.37 kg/m   Appears well, in no apparent distress  Results for orders placed or performed in visit on 12/09/23 (from the past 24 hours)  POCT urine pregnancy   Collection Time: 12/09/23  8:56 AM  Result Value Ref Range   Preg Test, Ur Positive (A) Negative    ASSESSMENT: Positive pregnancy test, [redacted]w[redacted]d by LMP    PLAN: Schedule for dating ultrasound in 2 weeks Prenatal vitamins: continue   Nausea medicines: requested-note routed to [facility] to send prescription   OB packet given: Yes  Annamarie Dawley  12/09/2023 9:00 AM

## 2023-12-28 ENCOUNTER — Other Ambulatory Visit: Payer: Self-pay | Admitting: Obstetrics & Gynecology

## 2023-12-28 DIAGNOSIS — O3680X0 Pregnancy with inconclusive fetal viability, not applicable or unspecified: Secondary | ICD-10-CM

## 2023-12-29 ENCOUNTER — Encounter: Payer: Self-pay | Admitting: Nurse Practitioner

## 2023-12-29 ENCOUNTER — Ambulatory Visit: Payer: Medicaid Other | Admitting: Nurse Practitioner

## 2023-12-29 VITALS — BP 108/78 | HR 67 | Ht 62.0 in | Wt 244.4 lb

## 2023-12-29 DIAGNOSIS — E1165 Type 2 diabetes mellitus with hyperglycemia: Secondary | ICD-10-CM

## 2023-12-29 DIAGNOSIS — E782 Mixed hyperlipidemia: Secondary | ICD-10-CM

## 2023-12-29 DIAGNOSIS — Z794 Long term (current) use of insulin: Secondary | ICD-10-CM | POA: Diagnosis not present

## 2023-12-29 LAB — POCT UA - MICROALBUMIN: Microalbumin Ur, POC: 30 mg/L

## 2023-12-29 LAB — POCT GLYCOSYLATED HEMOGLOBIN (HGB A1C): Hemoglobin A1C: 7.9 % — AB (ref 4.0–5.6)

## 2023-12-29 MED ORDER — INSULIN GLARGINE 100 UNIT/ML SOLOSTAR PEN: 80.0000 [IU] | PEN_INJECTOR | Freq: Every day | SUBCUTANEOUS | 3 refills | Status: DC

## 2023-12-29 MED ORDER — INSULIN LISPRO (1 UNIT DIAL) 100 UNIT/ML (KWIKPEN)
20.0000 [IU] | PEN_INJECTOR | Freq: Three times a day (TID) | SUBCUTANEOUS | 3 refills | Status: DC
Start: 2023-12-29 — End: 2024-02-10

## 2023-12-29 MED ORDER — CAREFINE PEN NEEDLES 31G X 6 MM MISC: 6 refills | Status: DC

## 2023-12-29 NOTE — Progress Notes (Signed)
 Endocrinology Follow Up Note       12/29/2023, 9:52 AM   Subjective:    Patient ID: Claire Harrison, female    DOB: 16-Jun-1996.  Claire Harrison is being seen in follow up after being seen in consultation for management of currently uncontrolled symptomatic diabetes requested by  Wilmon Pali, FNP.  She was previously seen in this clinic by Dr. Fransico Him in 2020 but disappeared from care.   Past Medical History:  Diagnosis Date   Anxiety    Asthma    Blood transfusion without reported diagnosis    Depression    Gestational diabetes    Migraine without aura and without status migrainosus, not intractable    Pre-eclampsia     Past Surgical History:  Procedure Laterality Date   CESAREAN SECTION     2018, 2020   CESAREAN SECTION N/A 12/10/2021   Procedure: CESAREAN SECTION;  Surgeon: Catalina Antigua, MD;  Location: [facility];  Service: Obstetrics;  Laterality: N/A;   DILATION AND CURETTAGE OF UTERUS N/A 08/01/2016   Procedure: SUCTION DILATATION AND CURETTAGE;  Surgeon: Tilda Burrow, MD;  Location: [facility];  Service: Gynecology;  Laterality: N/A;    Social History   Socioeconomic History   Marital status: Married    Spouse name: Magdiel   Number of children: 3   Years of education: Not on file   Highest education level: Some college, no degree  Occupational History   Not on file  Tobacco Use   Smoking status: Never   Smokeless tobacco: Never  Vaping Use   Vaping status: Never Used  Substance and Sexual Activity   Alcohol use: No   Drug use: No   Sexual activity: Yes    Birth control/protection: Implant  Other Topics Concern   Not on file  Social History Narrative   Lives with family   L handed   Caffeine: 2-3 drinks a week    Social Drivers of Corporate investment banker Strain: Medium Risk (05/01/2022)   Received from [facility]   Overall Financial Resource Strain  (CARDIA)    Difficulty of Paying Living Expenses: Somewhat hard  Food Insecurity: No Food Insecurity (02/18/2022)   Received from [facility], [facility]   Hunger Vital Sign    Worried About Running Out of Food in the Last Year: Never true    Ran Out of Food in the Last Year: Never true  Recent Concern: Food Insecurity - Food Insecurity Present (01/14/2022)   Hunger Vital Sign    Worried About Running Out of Food in the Last Year: Sometimes true    Ran Out of Food in the Last Year: Patient declined  Transportation Needs: No Transportation Needs (01/20/2022)   Received from [facility], [facility]   PRAPARE - Transportation    Lack of Transportation (Medical): No    Lack of Transportation (Non-Medical): No  Physical Activity: Insufficiently Active (01/14/2022)   Exercise Vital Sign    Days of Exercise per Week: 2 days    Minutes of Exercise per Session: 10 min  Stress: Stress Concern Present (01/14/2022)   [employer] of Occupational  Health - Occupational Stress Questionnaire    Feeling of Stress : Very much  Social Connections: Socially Integrated (01/14/2022)   Social Connection and Isolation Panel [NHANES]    Frequency of Communication with Friends and Family: More than three times a week    Frequency of Social Gatherings with Friends and Family: Three times a week    Attends Religious Services: 1 to 4 times per year    Active Member of Clubs or Organizations: No    Attends Engineer, structural: 1 to 4 times per year    Marital Status: Married    Family History  Problem Relation Age of Onset   Diabetes Mother    Hypertension Mother    Kidney failure Mother    Miscarriages / [place] Mother    Diabetes Father    Diabetes Sister    Miscarriages / [place] Sister    Diabetes Sister    Miscarriages / Stillbirths Sister    Scoliosis Brother    Heart murmur Daughter    Bronchitis Son    Colon cancer Maternal Aunt    Diabetes Maternal  Grandmother    Kidney failure Maternal Grandmother     Outpatient Encounter Medications as of 12/29/2023  Medication Sig   ATROVENT HFA 17 MCG/ACT inhaler Inhale into the lungs.   Continuous Glucose Sensor (DEXCOM G7 SENSOR) MISC Inject 1 Application into the skin as directed. Change sensor every 10 days as directed.   glucose blood (ACCU-CHEK GUIDE) test strip Use as instructed to monitor glucose 4 times daily   Insulin Pen Needle (CAREFINE PEN NEEDLES) 31G X 6 MM MISC Use to inject insulin 4 times daily   Multiple Vitamin (MULTIVITAMIN) tablet Take 1 tablet by mouth daily.   ondansetron (ZOFRAN) 4 MG tablet Take 1 tablet (4 mg total) by mouth every 8 (eight) hours as needed.   Prenatal Vit-Fe Fumarate-FA (PRENATAL VITAMIN PO) Take by mouth daily.   triamcinolone ointment (KENALOG) 0.1 % Apply 1 application  topically 2 (two) times daily.   [DISCONTINUED] insulin glargine (LANTUS) 100 UNIT/ML Solostar Pen Inject 60 Units into the skin at bedtime.   [DISCONTINUED] insulin lispro (HUMALOG KWIKPEN) 100 UNIT/ML KwikPen Inject 16-22 Units into the skin 3 (three) times daily.   insulin glargine (LANTUS) 100 UNIT/ML Solostar Pen Inject 80 Units into the skin at bedtime.   insulin lispro (HUMALOG KWIKPEN) 100 UNIT/ML KwikPen Inject 20-26 Units into the skin 3 (three) times daily.   [DISCONTINUED] cetirizine (ZYRTEC) 10 MG tablet Take 10 mg by mouth daily. (Patient not taking: Reported on 12/29/2023)   [DISCONTINUED] ELINEST 0.3-30 MG-MCG tablet Take 1 tablet by mouth daily. (Patient not taking: Reported on 12/29/2023)   [DISCONTINUED] Fish Oil-Cholecalciferol (FISH OIL + D3 PO) Take by mouth. Patient states that she takes 2 by mouth daily (Patient not taking: Reported on 12/29/2023)   [DISCONTINUED] ibuprofen (ADVIL) 600 MG tablet Take 1 tablet (600 mg total) by mouth every 6 (six) hours. (Patient not taking: Reported on 12/29/2023)   [DISCONTINUED] Insulin Pen Needle (PENTIPS) 32G X 4 MM MISC Use as directed  up to 4 times daily (Patient not taking: Reported on 12/29/2023)   [DISCONTINUED] Multiple Vitamin (MULTI-DAY PO) Take by mouth. Patient states that she takes 2 by mouth daily (Patient not taking: Reported on 12/29/2023)   [DISCONTINUED] NIFEdipine (ADALAT CC) 30 MG 24 hr tablet Take 1 tablet (30 mg total) by mouth daily. (Patient not taking: Reported on 12/29/2023)   [DISCONTINUED] Vitamin D, Ergocalciferol, (DRISDOL) 1.25 MG (  50000 UNIT) CAPS capsule Take 1 capsule (50,000 Units total) by mouth every 7 (seven) days. (Patient not taking: Reported on 12/29/2023)   No facility-administered encounter medications on file as of 12/29/2023.    ALLERGIES: Allergies  Allergen Reactions   Bee Venom Hives   Penicillins Anaphylaxis   Mango Flavoring Agent (Non-Screening) Hives and Swelling   Peanut-Containing Drug Products    Latex Rash    VACCINATION STATUS: Immunization History  Administered Date(s) Administered   Influenza,inj,Quad PF,6+ Mos 10/13/2016, 07/03/2020, 07/30/2021   Moderna Sars-Covid-2 Vaccination 03/05/2020, 04/02/2020, 11/19/2020   Tdap 10/01/2021    Diabetes She presents for her follow-up diabetic visit. She has type 2 diabetes mellitus. Onset time: diagnosed at age 13-gestational first. Her disease course has been fluctuating. There are no hypoglycemic associated symptoms. Associated symptoms include blurred vision, fatigue, foot paresthesias and visual change (seeing floaters). Pertinent negatives for diabetes include no polydipsia, no polyuria and no weight loss. There are no hypoglycemic complications. Required assistance: EMS was called for low event in the 40s. Symptoms are stable. Diabetic complications include peripheral neuropathy. Risk factors for coronary artery disease include diabetes mellitus, obesity, sedentary lifestyle and stress. Current diabetic treatment includes intensive insulin program. She is compliant with treatment most of the time. She is following a generally  unhealthy diet. When asked about meal planning, she reported none. She has not had a previous visit with a dietitian. She participates in exercise intermittently. Her home blood glucose trend is fluctuating dramatically. Her overall blood glucose range is >200 mg/dl. (She presents today with her CGM showing fluctuating but improving glycemic profile overall.  Her POCT A1c today is 7.9%, improving slightly from last visit of 8%.  Analysis of her CGM shows TIR 39%, TAR 61%, TBR 0% with a GMI of 8.2%.  She just found out recently that she is pregnant, has ultrasound with OBGYN tomorrow.) An ACE inhibitor/angiotensin II receptor blocker is not being taken. She does not see a podiatrist.Eye exam is not current.    Review of systems  Constitutional: + Minimally fluctuating body weight,  current Body mass index is 44.7 kg/m. , no fatigue, no subjective hyperthermia, no subjective hypothermia Eyes: no blurry vision, no xerophthalmia ENT: no sore throat, no nodules palpated in throat, no dysphagia/odynophagia, no hoarseness Cardiovascular: no chest pain, no shortness of breath, no palpitations, no leg swelling Respiratory: no cough, no shortness of breath Gastrointestinal: no nausea/vomiting/diarrhea Musculoskeletal: no muscle/joint aches Skin: no rashes, no hyperemia Neurological: no tremors, no numbness, no tingling, no dizziness Psychiatric: no depression, no anxiety  Objective:     BP 108/78 (BP Location: Left Arm, Patient Position: Sitting, Cuff Size: Large)   Pulse 67   Ht 5\' 2"  (1.575 m)   Wt 244 lb 6.4 oz (110.9 kg)   LMP 11/02/2023 (Exact Date)   BMI 44.70 kg/m   Wt Readings from Last 3 Encounters:  12/29/23 244 lb 6.4 oz (110.9 kg)  12/09/23 242 lb 9.6 oz (110 kg)  09/29/23 254 lb 12.8 oz (115.6 kg)     BP Readings from Last 3 Encounters:  12/29/23 108/78  12/09/23 111/72  09/29/23 116/74      Physical Exam- Limited  Constitutional:  Body mass index is 44.7 kg/m. , not in  acute distress, normal state of mind Eyes:  EOMI, no exophthalmos Musculoskeletal: no gross deformities, strength intact in all four extremities, no gross restriction of joint movements Skin:  no rashes, no hyperemia Neurological: no tremor with outstretched hands   Diabetic  Foot Exam - Simple   No data filed     CMP ( most recent) CMP     Component Value Date/Time   NA 136 12/10/2021 1616   NA 137 11/26/2021 1126   K 3.9 12/10/2021 1616   CL 107 12/10/2021 1616   CO2 21 (L) 12/10/2021 1616   GLUCOSE 221 (H) 12/10/2021 1616   BUN 11 12/10/2021 1616   BUN 9 11/26/2021 1126   CREATININE 0.63 12/10/2021 1616   CALCIUM 9.1 12/10/2021 1616   PROT 6.0 (L) 12/10/2021 1616   PROT 6.2 11/26/2021 1126   ALBUMIN 2.3 (L) 12/10/2021 1616   ALBUMIN 3.4 (L) 11/26/2021 1126   AST 18 12/10/2021 1616   ALT 15 12/10/2021 1616   ALKPHOS 115 12/10/2021 1616   BILITOT <0.1 (L) 12/10/2021 1616   BILITOT <0.2 11/26/2021 1126   GFRNONAA >60 12/10/2021 1616     Diabetic Labs (most recent): Lab Results  Component Value Date   HGBA1C 7.9 (A) 12/29/2023   HGBA1C 8.0 (A) 09/29/2023   HGBA1C 7.6 (A) 06/18/2023   MICROALBUR 30 mg/L 12/29/2023   MICROALBUR 150 mg/L 01/15/2023     Lipid Panel ( most recent) Lipid Panel  No results found for: "CHOL", "TRIG", "HDL", "CHOLHDL", "VLDL", "LDLCALC", "LDLDIRECT", "LABVLDL"    Lab Results  Component Value Date   TSH 2.130 07/01/2021           Assessment & Plan:   1) Type 2 diabetes mellitus with hyperglycemia, with long-term current use of insulin (HCC)  She presents today with her CGM showing fluctuating but improving glycemic profile overall.  Her POCT A1c today is 7.9%, improving slightly from last visit of 8%.  Analysis of her CGM shows TIR 39%, TAR 61%, TBR 0% with a GMI of 8.2%.  She just found out recently that she is pregnant, has ultrasound with OBGYN tomorrow.  - Claire Harrison has currently uncontrolled symptomatic type 2 DM  since 28 years of age.   -Recent labs reviewed.  POCT UM today was normal.  - I had a long discussion with her about the progressive nature of diabetes and the pathology behind its complications. -her diabetes is not currently complicated but she remains at a high risk for more acute and chronic complications which include CAD, CVA, CKD, retinopathy, and neuropathy. These are all discussed in detail with her.  The following Lifestyle Medicine recommendations according to American College of Lifestyle Medicine [facility]) were discussed and offered to patient and she agrees to start the journey:  A. Whole Foods, Plant-based plate comprising of fruits and vegetables, plant-based proteins, whole-grain carbohydrates was discussed in detail with the patient.   A list for source of those nutrients were also provided to the patient.  Patient will use only water or unsweetened tea for hydration. B.  The need to stay away from risky substances including alcohol, smoking; obtaining 7 to 9 hours of restorative sleep, at least 150 minutes of moderate intensity exercise weekly, the importance of healthy social connections,  and stress reduction techniques were discussed. C.  A full color page of  Calorie density of various food groups per pound showing examples of each food groups was provided to the patient.  - Nutritional counseling repeated at each appointment due to patients tendency to fall back in to old habits.  - The patient admits there is a room for improvement in their diet and drink choices. -  Suggestion is made for the patient to avoid simple carbohydrates from  their diet including Cakes, Sweet Desserts / Pastries, Ice Cream, Soda (diet and regular), Sweet Tea, Candies, Chips, Cookies, Sweet Pastries, Store Bought Juices, Alcohol in Excess of 1-2 drinks a day, Artificial Sweeteners, Coffee Creamer, and "Sugar-free" Products. This will help patient to have stable blood glucose profile and potentially avoid  unintended weight gain.   - I encouraged the patient to switch to unprocessed or minimally processed complex starch and increased protein intake (animal or plant source), fruits, and vegetables.   - Patient is advised to stick to a routine mealtimes to eat 3 meals a day and avoid unnecessary snacks (to snack only to correct hypoglycemia).  - I have approached her with the following individualized plan to manage her diabetes and patient agrees:   -She is advised to continue Lantus 60 units SQ nightly and adjust her Humalog to 20-26 units TID with meals if glucose is above 90 and she is eating (Specific instructions on how to titrate insulin dosage based on glucose readings given to patient in writing).   She will need tighter control of her glucose now that she is pregnant to ensure healthy development of fetus.  -We did talk about initiation of Omnipod 5 but I am somewhat hesitant due to the parameters on the device not being able to be adjusted to tighter controls during pregnancy.  Will discuss strategies with Omnipod rep tomorrow.  -she is encouraged to continue monitoring glucose 4 times daily (using her CGM), before meals and before bed, and to call the clinic if she has readings less than 70 or above 300 for 3 tests in a row.    - she is warned not to take insulin without proper monitoring per orders. - Adjustment parameters are given to her for hypo and hyperglycemia in writing.  - she will not be good candidate for GLP1 therapy as originally thought as she found out her aunt previously had thyroid cancer, making this too high risk for her.  She asked about Trulicity today which is in this same class.    -She did not go through with the bariatric weight loss surgery, as she wanted to get better control of her glucose first.  - Specific targets for  A1c; LDL, HDL, and Triglycerides were discussed with the patient.  2) Blood Pressure /Hypertension:  her blood pressure is controlled to  target.   she is advised to continue her current medications including Nifedipine 30 mg p.o. daily with breakfast.  3) Lipids/Hyperlipidemia:    Her recent lipid panel from 12/31/22 shows controlled LDL of 83 and slightly elevated triglycerides of 186.  She is not currently on any lipid lowering medications.  She is advised to adopt low fat diet and increase exercise when able.  4)  Weight/Diet:  her Body mass index is 44.7 kg/m.  -  clearly complicating her diabetes care.   she is a candidate for weight loss. I discussed with her the fact that loss of 5 - 10% of her  current body weight will have the most impact on her diabetes management.  Exercise, and detailed carbohydrates information provided  -  detailed on discharge instructions.  She asked about other weight loss medications today including GLP1 product (which we have not used due to positive thyroid cancer family history).  I do not consider her a good candidate for Phentermine either due to frequent headaches.  She asked about referral for bariatric surgery and I did initiate that for her today.  I also recommended  she keep a food diary for [place] to review at her next visit.  5) Vitamin D deficiency Her recent vitamin D level on 12/31/22 was low at 10.8.  She is not currently on any supplementation, has not been taking anything extra right now due to pregnancy.   6) Chronic Care/Health Maintenance: -she is not on ACEI/ARB or Statin medications and is encouraged to initiate and continue to follow up with Ophthalmology, Dentist, Podiatrist at least yearly or according to recommendations, and advised to stay away from smoking. I have recommended yearly flu vaccine and pneumonia vaccine at least every 5 years; moderate intensity exercise for up to 150 minutes weekly; and sleep for at least 7 hours a day.  - she is advised to maintain close follow up with Wilmon Pali, FNP for primary care needs, as well as her other providers for optimal and  coordinated care.      I spent  43  minutes in the care of the patient today including review of labs from CMP, Lipids, Thyroid Function, Hematology (current and previous including abstractions from other facilities); face-to-face time discussing  her blood glucose readings/logs, discussing hypoglycemia and hyperglycemia episodes and symptoms, medications doses, her options of short and long term treatment based on the latest standards of care / guidelines;  discussion about incorporating lifestyle medicine;  and documenting the encounter. Risk reduction counseling performed per USPSTF guidelines to reduce obesity and cardiovascular risk factors.     Please refer to Patient Instructions for Blood Glucose Monitoring and Insulin/Medications Dosing Guide"  in media tab for additional information. Please  also refer to " Patient Self Inventory" in the Media  tab for reviewed elements of pertinent patient history.  [name] participated in the discussions, expressed understanding, and voiced agreement with the above plans.  All questions were answered to her satisfaction. she is encouraged to contact clinic should she have any questions or concerns prior to her return visit.     Follow up plan: - Return in about 3 months (around 03/29/2024) for Diabetes F/U with A1c in office, No previsit labs, Bring meter and logs.   Ronny Bacon, [facility] [address] [place], [place] [address] Phone: [phone] Fax: [phone]  12/29/2023, 9:52 AM

## 2023-12-30 ENCOUNTER — Other Ambulatory Visit: Payer: Self-pay | Admitting: Obstetrics & Gynecology

## 2023-12-30 ENCOUNTER — Ambulatory Visit (INDEPENDENT_AMBULATORY_CARE_PROVIDER_SITE_OTHER): Admitting: Radiology

## 2023-12-30 ENCOUNTER — Other Ambulatory Visit

## 2023-12-30 DIAGNOSIS — Z3A08 8 weeks gestation of pregnancy: Secondary | ICD-10-CM | POA: Diagnosis not present

## 2023-12-30 DIAGNOSIS — O26841 Uterine size-date discrepancy, first trimester: Secondary | ICD-10-CM

## 2023-12-30 DIAGNOSIS — O3680X0 Pregnancy with inconclusive fetal viability, not applicable or unspecified: Secondary | ICD-10-CM

## 2023-12-30 DIAGNOSIS — O09299 Supervision of pregnancy with other poor reproductive or obstetric history, unspecified trimester: Secondary | ICD-10-CM

## 2023-12-30 NOTE — Progress Notes (Addendum)
 [place]:  LMP 11-02-23 = 8+2 weeks TA and TV imaging performed - Chaperone: Tish - Vinyl probe cover used Anteverted uterus with single gestational sac within upper mid cavity.  S < D Small yolk sac = 2.6 mm  CRL = 3.92 = 6+1 weeks  no heart motion seen, avascular embryonic pole with irregular sac.  There is a small sliver of subchorionic hemorrhage seen surrounding the sac.  Patient states she had an ultrasound performed out of a mobile unit in [place], [place] and the dates were the same one week ago.  (2 weeks < dates today)  ? MAB Normal ov's, neg adnexal regions - neg CDS - no free fluid present

## 2023-12-31 LAB — BETA HCG QUANT (REF LAB): hCG Quant: 12911 m[IU]/mL

## 2024-01-06 ENCOUNTER — Other Ambulatory Visit: Payer: Self-pay | Admitting: Obstetrics & Gynecology

## 2024-01-06 ENCOUNTER — Ambulatory Visit

## 2024-01-06 ENCOUNTER — Encounter: Payer: Self-pay | Admitting: Obstetrics & Gynecology

## 2024-01-06 ENCOUNTER — Ambulatory Visit: Admitting: Obstetrics & Gynecology

## 2024-01-06 VITALS — BP 137/82 | HR 86 | Ht 62.0 in

## 2024-01-06 DIAGNOSIS — O3680X0 Pregnancy with inconclusive fetal viability, not applicable or unspecified: Secondary | ICD-10-CM | POA: Diagnosis not present

## 2024-01-06 DIAGNOSIS — Z3A08 8 weeks gestation of pregnancy: Secondary | ICD-10-CM | POA: Diagnosis not present

## 2024-01-06 DIAGNOSIS — O021 Missed abortion: Secondary | ICD-10-CM | POA: Diagnosis not present

## 2024-01-06 DIAGNOSIS — Z3A01 Less than 8 weeks gestation of pregnancy: Secondary | ICD-10-CM

## 2024-01-06 NOTE — Progress Notes (Signed)
   GYN VISIT Patient name: Claire Harrison MRN [id number]  Date of birth: Nov 18, 1995 Chief Complaint:   Follow-up  History of Present Illness:   Claire Harrison is a 28 y.o. [phone]  female being seen today for early [place] follow up.  [place] completed 4/2- IUP noted, but small CRL with no cardiac motion  Today's [place]- confirmatory for miscarriage  She notes some mild cramping, but no bleeding.  Denies fever/chills, no other acute complaints  Patient's last menstrual period was 11/02/2023 (exact date).    Review of Systems:   Pertinent items are noted in HPI Denies fever/chills, dizziness, headaches, visual disturbances, fatigue, shortness of breath, chest pain, abdominal pain, vomiting. Pertinent History Reviewed:   Past Surgical History:  Procedure Laterality Date   CESAREAN SECTION     2018, 2020   CESAREAN SECTION N/A 12/10/2021   Procedure: CESAREAN SECTION;  Surgeon: Catalina Antigua, MD;  Location: [facility];  Service: Obstetrics;  Laterality: N/A;   DILATION AND CURETTAGE OF UTERUS N/A 08/01/2016   Procedure: SUCTION DILATATION AND CURETTAGE;  Surgeon: Tilda Burrow, MD;  Location: [facility];  Service: Gynecology;  Laterality: N/A;    Past Medical History:  Diagnosis Date   Anxiety    Asthma    Blood transfusion without reported diagnosis    Depression    Gestational diabetes    Migraine without aura and without status migrainosus, not intractable    Pre-eclampsia    Reviewed problem list, medications and allergies. Physical Assessment:   Vitals:   01/06/24 1640  BP: 137/82  Pulse: 86  Height: 5\' 2"  (1.575 m)  Body mass index is 44.7 kg/m.       Physical Examination:   General appearance: alert, well appearing, and in no distress  Psych: mood appropriate, normal affect  Skin: warm & dry   Cardiovascular: normal heart rate noted  Respiratory: normal respiratory effort, no distress  Abdomen: soft, non-tender   Pelvic: examination not indicated  Extremities: no  edema   Chaperone: N/A    Assessment & Plan:  1) Missed AB -reviewed today's [place] report, nonviable pregnancy -all options reviewed from monitoring, medication to surgical intervention -risk/benefit of each option reviewed, pt prefer D&E as she has had this in the past -desires [name] testing -would prefer this week, plan to review with Dr. Jacquelyne Balint, DO Attending Obstetrician & Gynecologist, [facility] for [facility], [facility]

## 2024-01-06 NOTE — Progress Notes (Signed)
 [place] TA/TV: 7+2 wks irregular gestational sac,no fetal pole or yolk sac visualized,GS 22.5 mm,normal ovaries,no free fluid,no pain during ultrasound  Chaperone Kara Mead

## 2024-01-07 ENCOUNTER — Other Ambulatory Visit: Payer: Self-pay

## 2024-01-07 ENCOUNTER — Other Ambulatory Visit: Payer: Self-pay | Admitting: Obstetrics & Gynecology

## 2024-01-07 ENCOUNTER — Encounter (HOSPITAL_COMMUNITY): Payer: Self-pay | Admitting: Obstetrics & Gynecology

## 2024-01-07 DIAGNOSIS — Z01818 Encounter for other preprocedural examination: Secondary | ICD-10-CM

## 2024-01-07 NOTE — Progress Notes (Signed)
 PCP - Nolon Stalls Cardiologist - none  Chest x-ray - n/a EKG - n/a Stress Test - n/a ECHO - n/a Cardiac Cath - n/a  ICD Pacemaker/Loop - n/a  Sleep Study -  n/a  Diabetes Type 2 Dexcom G7, sensor on right arm  THE NIGHT BEFORE SURGERY, take 40 units of Lantus Insulin.      THE MORNING OF SURGERY, take 10-15 units Humalog Insulin.    If your blood sugar is less than 70 mg/dL, you will need to treat for low blood sugar: Treat a low blood sugar (less than 70 mg/dL) with  cup of clear juice (cranberry or apple), 4 glucose tablets, OR glucose gel. Recheck blood sugar in 15 minutes after treatment (to make sure it is greater than 70 mg/dL). If your blood sugar is not greater than 70 mg/dL on recheck, call [phone] for further instructions.  Aspirin & Blood Thinner Instructions:  n/a  NPO  Anesthesia review: no  STOP now taking any Aspirin (unless otherwise instructed by your surgeon), Aleve, Naproxen, Ibuprofen, Motrin, Advil, Goody's, BC's, all herbal medications, fish oil, and all vitamins.   Coronavirus Screening Do you have any of the following symptoms:  Cough yes/no: No Fever (>100.64F)  yes/no: No Runny nose - yes, allergy related per pt. Sore throat yes/no: No Difficulty breathing/shortness of breath  yes/no: No  Have you traveled in the last 14 days and where? yes/no: No  Patient verbalized understanding of instructions that were given via phone.  Patient to arrive at the emergency entrance to be admitted for DOS.

## 2024-01-08 NOTE — Anesthesia Preprocedure Evaluation (Addendum)
 Anesthesia Evaluation  Patient identified by MRN, date of birth, ID band Patient awake    Reviewed: Allergy & Precautions, Patient's Chart, lab work & pertinent test results  History of Anesthesia Complications (+) PONV and history of anesthetic complications  Airway Mallampati: II  TM Distance: >3 FB Neck ROM: Full    Dental no notable dental hx.    Pulmonary neg pulmonary ROS   Pulmonary exam normal        Cardiovascular hypertension (hx gest HTN, no home meds),  Rhythm:Regular Rate:Normal     Neuro/Psych negative neurological ROS     GI/Hepatic Neg liver ROS,GERD  Controlled,,  Endo/Other  diabetes, Well Controlled, Type 2, Insulin Dependent  Class 3 obesityBMI 44 A1c 7.9  Renal/GU negative Renal ROS     Musculoskeletal negative musculoskeletal ROS (+)    Abdominal  (+) + obese  Peds  Hematology negative hematology ROS (+)   Anesthesia Other Findings   Reproductive/Obstetrics MAB 7 2/7wks                             Anesthesia Physical Anesthesia Plan  ASA: 3  Anesthesia Plan: General   Post-op Pain Management: Tylenol PO (pre-op)* and Toradol IV (intra-op)*   Induction: Intravenous  PONV Risk Score and Plan: 4 or greater and Ondansetron, Dexamethasone, Midazolam, Treatment may vary due to age or medical condition and Scopolamine patch - Pre-op  Airway Management Planned: LMA  Additional Equipment: None  Intra-op Plan:   Post-operative Plan: Extubation in OR  Informed Consent: I have reviewed the patients History and Physical, chart, labs and discussed the procedure including the risks, benefits and alternatives for the proposed anesthesia with the patient or authorized representative who has indicated his/her understanding and acceptance.     Dental advisory given  Plan Discussed with: CRNA  Anesthesia Plan Comments:        Anesthesia Quick Evaluation

## 2024-01-09 ENCOUNTER — Encounter (HOSPITAL_COMMUNITY)
Admission: RE | Disposition: A | Discharge status: Home / Self Care | Payer: Self-pay | Source: Home / Self Care | Attending: Obstetrics & Gynecology

## 2024-01-09 ENCOUNTER — Ambulatory Visit (HOSPITAL_BASED_OUTPATIENT_CLINIC_OR_DEPARTMENT_OTHER): Payer: Self-pay | Admitting: Anesthesiology

## 2024-01-09 ENCOUNTER — Other Ambulatory Visit: Payer: Self-pay

## 2024-01-09 ENCOUNTER — Ambulatory Visit (HOSPITAL_COMMUNITY)
Admission: RE | Admit: 2024-01-09 | Discharge: 2024-01-09 | Disposition: A | Discharge status: Home / Self Care | Attending: Obstetrics & Gynecology | Admitting: Obstetrics & Gynecology

## 2024-01-09 ENCOUNTER — Ambulatory Visit (HOSPITAL_COMMUNITY): Payer: Self-pay | Admitting: Anesthesiology

## 2024-01-09 ENCOUNTER — Other Ambulatory Visit (HOSPITAL_COMMUNITY): Payer: Self-pay

## 2024-01-09 ENCOUNTER — Encounter (HOSPITAL_COMMUNITY): Payer: Self-pay | Admitting: Obstetrics & Gynecology

## 2024-01-09 DIAGNOSIS — Z794 Long term (current) use of insulin: Secondary | ICD-10-CM | POA: Insufficient documentation

## 2024-01-09 DIAGNOSIS — O020 Blighted ovum and nonhydatidiform mole: Secondary | ICD-10-CM | POA: Diagnosis present

## 2024-01-09 DIAGNOSIS — E119 Type 2 diabetes mellitus without complications: Secondary | ICD-10-CM | POA: Insufficient documentation

## 2024-01-09 DIAGNOSIS — J45909 Unspecified asthma, uncomplicated: Secondary | ICD-10-CM | POA: Insufficient documentation

## 2024-01-09 DIAGNOSIS — O034 Incomplete spontaneous abortion without complication: Secondary | ICD-10-CM | POA: Insufficient documentation

## 2024-01-09 DIAGNOSIS — Z6841 Body Mass Index (BMI) 40.0 and over, adult: Secondary | ICD-10-CM | POA: Insufficient documentation

## 2024-01-09 DIAGNOSIS — Z3A01 Less than 8 weeks gestation of pregnancy: Secondary | ICD-10-CM

## 2024-01-09 DIAGNOSIS — Z3A00 Weeks of gestation of pregnancy not specified: Secondary | ICD-10-CM

## 2024-01-09 DIAGNOSIS — O021 Missed abortion: Secondary | ICD-10-CM

## 2024-01-09 DIAGNOSIS — E66813 Obesity, class 3: Secondary | ICD-10-CM | POA: Diagnosis not present

## 2024-01-09 DIAGNOSIS — Z01818 Encounter for other preprocedural examination: Secondary | ICD-10-CM

## 2024-01-09 DIAGNOSIS — K219 Gastro-esophageal reflux disease without esophagitis: Secondary | ICD-10-CM | POA: Diagnosis not present

## 2024-01-09 HISTORY — DX: Anemia, unspecified: D64.9

## 2024-01-09 HISTORY — PX: DILATION AND EVACUATION: SHX1459

## 2024-01-09 HISTORY — DX: Nausea with vomiting, unspecified: R11.2

## 2024-01-09 HISTORY — DX: Gastro-esophageal reflux disease without esophagitis: K21.9

## 2024-01-09 HISTORY — DX: Other specified postprocedural states: Z98.890

## 2024-01-09 HISTORY — DX: Type 2 diabetes mellitus without complications: E11.9

## 2024-01-09 LAB — GLUCOSE, CAPILLARY
Glucose-Capillary: 97 mg/dL (ref 70–99)
Glucose-Capillary: 114 mg/dL — ABNORMAL HIGH (ref 70–99)

## 2024-01-09 LAB — CBC
HCT: 41.3 % (ref 36.0–46.0)
Hemoglobin: 13.6 g/dL (ref 12.0–15.0)
MCH: 27 pg (ref 26.0–34.0)
MCHC: 32.9 g/dL (ref 30.0–36.0)
MCV: 81.9 fL (ref 80.0–100.0)
Platelets: 396 10*3/uL (ref 150–400)
RBC: 5.04 MIL/uL (ref 3.87–5.11)
RDW: 12.8 % (ref 11.5–15.5)
WBC: 11 10*3/uL — ABNORMAL HIGH (ref 4.0–10.5)
nRBC: 0 % (ref 0.0–0.2)

## 2024-01-09 LAB — BASIC METABOLIC PANEL WITH GFR
Anion gap: 14 (ref 5–15)
BUN: 12 mg/dL (ref 6–20)
CO2: 19 mmol/L — ABNORMAL LOW (ref 22–32)
Calcium: 9.3 mg/dL (ref 8.9–10.3)
Chloride: 102 mmol/L (ref 98–111)
Creatinine, Ser: 0.54 mg/dL (ref 0.44–1.00)
GFR, Estimated: >60 mL/min (ref >60)
Glucose, Bld: 99 mg/dL (ref 70–99)
Potassium: 3.8 mmol/L (ref 3.5–5.1)
Sodium: 135 mmol/L (ref 135–145)

## 2024-01-09 SURGERY — DILATION AND EVACUATION, UTERUS
Anesthesia: General

## 2024-01-09 MED ORDER — BUPIVACAINE HCL 0.5 % IJ SOLN
INTRAMUSCULAR | Status: DC | PRN
  Administered 2024-01-09: 10 mL

## 2024-01-09 MED ORDER — METHYLERGONOVINE MALEATE 0.2 MG/ML IJ SOLN
INTRAMUSCULAR | Status: DC | PRN
  Administered 2024-01-09: .2 mg via INTRAMUSCULAR

## 2024-01-09 MED ORDER — ACETAMINOPHEN 500 MG PO TABS: 1000.0000 mg | ORAL_TABLET | Freq: Once | ORAL | Status: AC

## 2024-01-09 MED ORDER — MIDAZOLAM HCL 2 MG/2ML IJ SOLN
INTRAMUSCULAR | Status: DC | PRN
  Administered 2024-01-09: 2 mg via INTRAVENOUS

## 2024-01-09 MED ORDER — DEXAMETHASONE SODIUM PHOSPHATE 10 MG/ML IJ SOLN
INTRAMUSCULAR | Status: DC | PRN
  Administered 2024-01-09: 5 mg via INTRAVENOUS

## 2024-01-09 MED ORDER — KETOROLAC TROMETHAMINE 30 MG/ML IJ SOLN: 30.0000 mg | INTRAMUSCULAR | Status: AC

## 2024-01-09 MED ORDER — DEXAMETHASONE SODIUM PHOSPHATE 4 MG/ML IJ SOLN
INTRAMUSCULAR | Status: DC | PRN
  Administered 2024-01-09: 5 mg via INTRAVENOUS

## 2024-01-09 MED ORDER — KETOROLAC TROMETHAMINE 30 MG/ML IJ SOLN
INTRAMUSCULAR | Status: AC
Start: 2024-01-09 — End: 2024-01-09
  Administered 2024-01-09: 30 mg via INTRAVENOUS
  Filled 2024-01-09: qty 1

## 2024-01-09 MED ORDER — AMISULPRIDE (ANTIEMETIC) 5 MG/2ML IV SOLN: 10.0000 mg | Freq: Once | INTRAVENOUS | Status: DC | PRN

## 2024-01-09 MED ORDER — HYDROMORPHONE HCL 1 MG/ML IJ SOLN: 0.2500 mg | INTRAMUSCULAR | Status: DC | PRN

## 2024-01-09 MED ORDER — DEXMEDETOMIDINE HCL IN NACL 80 MCG/20ML IV SOLN
INTRAVENOUS | Status: AC
  Filled 2024-01-09: qty 20

## 2024-01-09 MED ORDER — MEPERIDINE HCL 25 MG/ML IJ SOLN: 6.2500 mg | INTRAMUSCULAR | Status: DC | PRN

## 2024-01-09 MED ORDER — ONDANSETRON HCL 4 MG/2ML IJ SOLN
INTRAMUSCULAR | Status: AC
  Filled 2024-01-09: qty 2

## 2024-01-09 MED ORDER — ONDANSETRON 8 MG PO TBDP
8.0000 mg | ORAL_TABLET | Freq: Three times a day (TID) | ORAL | 0 refills | Status: DC | PRN
  Filled 2024-01-09: qty 8, 3d supply, fill #0

## 2024-01-09 MED ORDER — ONDANSETRON HCL 4 MG/2ML IJ SOLN
INTRAMUSCULAR | Status: DC | PRN
  Administered 2024-01-09: 4 mg via INTRAVENOUS

## 2024-01-09 MED ORDER — HYDROCODONE-ACETAMINOPHEN 5-325 MG PO TABS
1.0000 | ORAL_TABLET | Freq: Four times a day (QID) | ORAL | 0 refills | Status: DC | PRN
  Filled 2024-01-09: qty 10, 3d supply, fill #0

## 2024-01-09 MED ORDER — SCOPOLAMINE 1 MG/3DAYS TD PT72: 1.0000 | MEDICATED_PATCH | TRANSDERMAL | Status: DC

## 2024-01-09 MED ORDER — OXYCODONE HCL 5 MG PO TABS
5.0000 mg | ORAL_TABLET | Freq: Once | ORAL | Status: AC | PRN
  Administered 2024-01-09: 5 mg via ORAL

## 2024-01-09 MED ORDER — CHLORHEXIDINE GLUCONATE 0.12 % MT SOLN
OROMUCOSAL | Status: AC
  Administered 2024-01-09: 15 mL via OROMUCOSAL
  Filled 2024-01-09: qty 15

## 2024-01-09 MED ORDER — GENTAMICIN SULFATE 40 MG/ML IJ SOLN
380.0000 mg | INTRAVENOUS | Status: AC
  Administered 2024-01-09: 380 mg via INTRAVENOUS
  Filled 2024-01-09: qty 9.5

## 2024-01-09 MED ORDER — ACETAMINOPHEN 500 MG PO TABS
ORAL_TABLET | ORAL | Status: AC
  Administered 2024-01-09: 1000 mg via ORAL
  Filled 2024-01-09: qty 2

## 2024-01-09 MED ORDER — PROPOFOL 1000 MG/100ML IV EMUL
INTRAVENOUS | Status: AC
  Filled 2024-01-09: qty 100

## 2024-01-09 MED ORDER — POVIDONE-IODINE 10 % EX SWAB
2.0000 | Freq: Once | CUTANEOUS | Status: AC
  Administered 2024-01-09: 2 via TOPICAL

## 2024-01-09 MED ORDER — INSULIN ASPART 100 UNIT/ML IJ SOLN: 0.0000 [IU] | INTRAMUSCULAR | Status: DC | PRN

## 2024-01-09 MED ORDER — CHLORHEXIDINE GLUCONATE 0.12 % MT SOLN: 15.0000 mL | Freq: Once | OROMUCOSAL | Status: AC

## 2024-01-09 MED ORDER — SCOPOLAMINE 1 MG/3DAYS TD PT72
MEDICATED_PATCH | TRANSDERMAL | Status: AC
  Administered 2024-01-09: 1.5 mg via TRANSDERMAL
  Filled 2024-01-09: qty 1

## 2024-01-09 MED ORDER — LIDOCAINE 2% (20 MG/ML) 5 ML SYRINGE
INTRAMUSCULAR | Status: DC | PRN
  Administered 2024-01-09: 60 mg via INTRAVENOUS

## 2024-01-09 MED ORDER — METHYLERGONOVINE MALEATE 0.2 MG PO TABS
0.2000 mg | ORAL_TABLET | Freq: Four times a day (QID) | ORAL | 0 refills | Status: DC
  Filled 2024-01-09: qty 4, 1d supply, fill #0

## 2024-01-09 MED ORDER — KETOROLAC TROMETHAMINE 30 MG/ML IJ SOLN: 30.0000 mg | Freq: Once | INTRAMUSCULAR | Status: DC | PRN

## 2024-01-09 MED ORDER — OXYCODONE HCL 5 MG/5ML PO SOLN: 5.0000 mg | Freq: Once | ORAL | Status: AC | PRN

## 2024-01-09 MED ORDER — OXYCODONE HCL 5 MG PO TABS
ORAL_TABLET | ORAL | Status: AC
  Filled 2024-01-09: qty 1

## 2024-01-09 MED ORDER — ONDANSETRON HCL 4 MG/2ML IJ SOLN: 4.0000 mg | Freq: Once | INTRAMUSCULAR | Status: DC | PRN

## 2024-01-09 MED ORDER — PROPOFOL 10 MG/ML IV BOLUS
INTRAVENOUS | Status: DC | PRN
  Administered 2024-01-09: 250 mg via INTRAVENOUS

## 2024-01-09 MED ORDER — 0.9 % SODIUM CHLORIDE (POUR BTL) OPTIME
TOPICAL | Status: DC | PRN
Start: 2024-01-09 — End: 2024-01-09
  Administered 2024-01-09: 1000 mL

## 2024-01-09 MED ORDER — KETOROLAC TROMETHAMINE 15 MG/ML IJ SOLN
INTRAMUSCULAR | Status: DC | PRN
Start: 2024-01-09 — End: 2024-01-09
  Administered 2024-01-09: 15 mg via INTRAVENOUS

## 2024-01-09 MED ORDER — METHYLERGONOVINE MALEATE 0.2 MG/ML IJ SOLN
INTRAMUSCULAR | Status: AC
  Filled 2024-01-09: qty 1

## 2024-01-09 MED ORDER — KETOROLAC TROMETHAMINE 10 MG PO TABS
10.0000 mg | ORAL_TABLET | Freq: Three times a day (TID) | ORAL | 0 refills | Status: DC | PRN
  Filled 2024-01-09: qty 15, 5d supply, fill #0

## 2024-01-09 MED ORDER — ORAL CARE MOUTH RINSE: 15.0000 mL | Freq: Once | OROMUCOSAL | Status: AC

## 2024-01-09 MED ORDER — BUPIVACAINE HCL 0.5 % IJ SOLN
INTRAMUSCULAR | Status: AC
  Filled 2024-01-09: qty 1

## 2024-01-09 MED ORDER — LIDOCAINE 2% (20 MG/ML) 5 ML SYRINGE
INTRAMUSCULAR | Status: AC
  Filled 2024-01-09: qty 5

## 2024-01-09 MED ORDER — TRANEXAMIC ACID-NACL 1000-0.7 MG/100ML-% IV SOLN
INTRAVENOUS | Status: AC
  Filled 2024-01-09: qty 100

## 2024-01-09 MED ORDER — FENTANYL CITRATE (PF) 250 MCG/5ML IJ SOLN
INTRAMUSCULAR | Status: DC | PRN
  Administered 2024-01-09: 100 ug via INTRAVENOUS

## 2024-01-09 MED ORDER — CLINDAMYCIN PHOSPHATE 300 MG/50ML IV SOLN
INTRAVENOUS | Status: DC | PRN
  Administered 2024-01-09: 900 mg via INTRAVENOUS

## 2024-01-09 MED ORDER — CLINDAMYCIN PHOSPHATE 900 MG/50ML IV SOLN
900.0000 mg | INTRAVENOUS | Status: AC
Start: 2024-01-09 — End: 2024-01-09
  Administered 2024-01-09: 900 mg via INTRAVENOUS
  Filled 2024-01-09: qty 50

## 2024-01-09 MED ORDER — TRANEXAMIC ACID-NACL 1000-0.7 MG/100ML-% IV SOLN
1000.0000 mg | Freq: Once | INTRAVENOUS | Status: AC
  Administered 2024-01-09: 1000 mg via INTRAVENOUS

## 2024-01-09 MED ORDER — MIDAZOLAM HCL 2 MG/2ML IJ SOLN
INTRAMUSCULAR | Status: AC
  Filled 2024-01-09: qty 2

## 2024-01-09 MED ORDER — FENTANYL CITRATE (PF) 250 MCG/5ML IJ SOLN
INTRAMUSCULAR | Status: AC
  Filled 2024-01-09: qty 5

## 2024-01-09 MED ORDER — PROPOFOL 500 MG/50ML IV EMUL
INTRAVENOUS | Status: DC | PRN
  Administered 2024-01-09: 25 ug/kg/min via INTRAVENOUS

## 2024-01-09 MED ORDER — DEXAMETHASONE SODIUM PHOSPHATE 10 MG/ML IJ SOLN
INTRAMUSCULAR | Status: AC
  Filled 2024-01-09: qty 1

## 2024-01-09 MED ORDER — LACTATED RINGERS IV SOLN: INTRAVENOUS | Status: DC

## 2024-01-09 SURGICAL SUPPLY — 19 items
CATH ROBINSON RED A/P 16FR (CATHETERS) ×1 IMPLANT
FILTER UTR ASPR ASSEMBLY (MISCELLANEOUS) ×1 IMPLANT
GLOVE BIOGEL PI IND STRL 7.0 (GLOVE) ×1 IMPLANT
GLOVE BIOGEL PI IND STRL 8 (GLOVE) ×1 IMPLANT
GLOVE ECLIPSE 8.0 STRL XLNG CF (GLOVE) ×1 IMPLANT
GOWN STRL REUS W/ TWL LRG LVL3 (GOWN DISPOSABLE) ×2 IMPLANT
KIT BERKELEY 1ST TRI 3/8 NO TR (MISCELLANEOUS) ×1 IMPLANT
KIT BERKELEY 1ST TRIMESTER 3/8 (MISCELLANEOUS) ×1 IMPLANT
NS IRRIG 1000ML POUR BTL (IV SOLUTION) ×1 IMPLANT
PACK VAGINAL MINOR WOMEN LF (CUSTOM PROCEDURE TRAY) ×1 IMPLANT
PAD OB MATERNITY 11 LF (PERSONAL CARE ITEMS) ×1 IMPLANT
SET BERKELEY SUCTION TUBING (SUCTIONS) ×1 IMPLANT
SPIKE FLUID TRANSFER (MISCELLANEOUS) ×1 IMPLANT
TOWEL GREEN STERILE FF (TOWEL DISPOSABLE) ×2 IMPLANT
UNDERPAD 30X36 HEAVY ABSORB (UNDERPADS AND DIAPERS) ×1 IMPLANT
VACURETTE 10 RIGID CVD (CANNULA) IMPLANT
VACURETTE 7MM CVD STRL WRAP (CANNULA) IMPLANT
VACURETTE 8 RIGID CVD (CANNULA) IMPLANT
VACURETTE 9 RIGID CVD (CANNULA) IMPLANT

## 2024-01-09 NOTE — Anesthesia Postprocedure Evaluation (Signed)
 Anesthesia Post Note  Patient: Claire Harrison  Procedure(s) Performed: DILATION AND EVACUATION, UTERUS     Patient location during evaluation: PACU Anesthesia Type: General Level of consciousness: awake and alert Pain management: pain level controlled Vital Signs Assessment: post-procedure vital signs reviewed and stable Respiratory status: spontaneous breathing, nonlabored ventilation, respiratory function stable and patient connected to nasal cannula oxygen Cardiovascular status: blood pressure returned to baseline and stable Postop Assessment: no apparent nausea or vomiting Anesthetic complications: no   No notable events documented.  Last Vitals:  Vitals:   01/09/24 0945 01/09/24 1000  BP: 129/86 (!) 136/97  Pulse: 73 73  Resp: 18 (!) 22  Temp:  36.7 C  SpO2: 99% 96%    Last Pain:  Vitals:   01/09/24 1008  TempSrc:   PainSc: 4                  Adenike Shidler P Soni Kegel

## 2024-01-09 NOTE — Op Note (Signed)
 Preoperative diagnosis:  Blighted ovum in the first trimester  Postoperative diagnosis:  Same as above  Procedure:  Cervical dilation with suction and sharp uterine curettage                       Paracervical block placed by me for post operative pain management  Surgeon:  Wendelyn Halter  Anesthesia:  Laryngeal mask airway  Findings:  The patient was known to have a first trimester pregnancy loss by serial sonogram evaluations from the office.  She was counseled and  As a result she is admitted for a uterine  Evacuation.  Description of operation:  The patient was taken to the operating room and placed in the supine position.  She underwent laryngeal mask airway general anesthesia.  The patient was placed in the dorsal lithotomy position.  The vagina was prepped and draped in the usual sterile fashion.  A Graves speculum was placed.  The anterior cervix was grasped with a single-tooth tenaculum. 10cc of 0.5% bupivicaine plain was injected as a paracervical block for post operatvie pain management.   The cervix was dilated serially with Hegar dilators.  A #8 curved suction curette was placed in the uterus.  The suction pressure was placed at 55 and several passes were made.  All of the intrauterine contents were removed.  The sharp curette was used x1 to feel uterine crie in all areas.  The patient was given Methergine 0.2 mg IM x1.  There was good hemostasis.  The patient was given cleocin and gentamicin mg IV preoperatively.  The patient was given Toradol 30 mg IV preoperatively.  Estimated blood loss for the procedure was minmal cc.  The patient was awakened from anesthesia taken to the recovery room in good stable condition.  All counts were correct x3.  Wendelyn Halter, MD 01/09/2024 9:19 AM

## 2024-01-09 NOTE — H&P (Signed)
 Preoperative History and Physical  Claire Harrison is a 28 y.o. [phone] with Patient's last menstrual period was 11/02/2023 (exact date). admitted for a D&E for 8 week pregnancy loss, anembryonic pregnancy, 6 weeks size.    PMH:    Past Medical History:  Diagnosis Date   Anemia    Anxiety    Asthma    Blood transfusion without reported diagnosis    with c/s after delivery   Depression    DM (diabetes mellitus) (HCC)    type 2   GERD (gastroesophageal reflux disease)    hx in past - no current problems   Gestational diabetes    Migraine without aura and without status migrainosus, not intractable    PONV (postoperative nausea and vomiting)    Pre-eclampsia    with pregnancy - all 3    PSH:     Past Surgical History:  Procedure Laterality Date   CESAREAN SECTION     2018, 2020   CESAREAN SECTION N/A 12/10/2021   Procedure: CESAREAN SECTION;  Surgeon: Verlyn Goad, MD;  Location: [facility];  Service: Obstetrics;  Laterality: N/A;   DILATION AND CURETTAGE OF UTERUS N/A 08/01/2016   Procedure: SUCTION DILATATION AND CURETTAGE;  Surgeon: Albino Hum, MD;  Location: [facility];  Service: Gynecology;  Laterality: N/A;   WISDOM TOOTH EXTRACTION      POb/GynH:      OB History     Gravida  5   Para  3   Term  2   Preterm  1   AB  1   Living  3      SAB  1   IAB      Ectopic      Multiple  0   Live Births  3           SH:   Social History   Tobacco Use   Smoking status: Never   Smokeless tobacco: Never  Vaping Use   Vaping status: Never Used  Substance Use Topics   Alcohol use: No   Drug use: No    FH:    Family History  Problem Relation Age of Onset   Diabetes Mother    Hypertension Mother    Kidney failure Mother    Miscarriages / [place] Mother    Diabetes Father    Diabetes Sister    Miscarriages / [place] Sister    Diabetes Sister    Miscarriages / Stillbirths Sister    Scoliosis Brother    Heart murmur Daughter     Bronchitis Son    Colon cancer Maternal Aunt    Diabetes Maternal Grandmother    Kidney failure Maternal Grandmother      Allergies:  Allergies  Allergen Reactions   Bee Venom Hives   Penicillins Anaphylaxis   Mango Flavoring Agent (Non-Screening) Hives and Swelling   Peanut-Containing Drug Products Hives and Swelling   Latex Rash    Medications:       Current Facility-Administered Medications:    clindamycin (CLEOCIN) IVPB 900 mg, 900 mg, Intravenous, 60 min Pre-Op, Last Rate: 100 mL/hr at 01/09/24 0822, 900 mg at 01/09/24 4742 **AND** gentamicin (GARAMYCIN) 380 mg in dextrose 5 % 100 mL IVPB, 380 mg, Intravenous, 60 min Pre-Op, Reighan Hipolito, Sixto Duhamel, MD   insulin aspart (novoLOG) injection 0-14 Units, 0-14 Units, Subcutaneous, Q2H PRN, Stoltzfus, Gregory P, DO   lactated ringers infusion, , Intravenous, Continuous, Stoltzfus, Valente Gaskin, DO, Last Rate: 10 mL/hr at 01/09/24 0735, New  Bag at 01/09/24 0735   scopolamine (TRANSDERM-SCOP) 1 MG/3DAYS 1.5 mg, 1 patch, Transdermal, Q72H, Finucane, Elizabeth M, DO, 1.5 mg at 01/09/24 0734   tranexamic acid (CYKLOKAPRON) 1000MG /131mL IVPB, , , ,    tranexamic acid (CYKLOKAPRON) IVPB 1,000 mg, 1,000 mg, Intravenous, Once, Wendelyn Halter, MD  Review of Systems:   Review of Systems  Constitutional: Negative for fever, chills, weight loss, malaise/fatigue and diaphoresis.  HENT: Negative for hearing loss, ear pain, nosebleeds, congestion, sore throat, neck pain, tinnitus and ear discharge.   Eyes: Negative for blurred vision, double vision, photophobia, pain, discharge and redness.  Respiratory: Negative for cough, hemoptysis, sputum production, shortness of breath, wheezing and stridor.   Cardiovascular: Negative for chest pain, palpitations, orthopnea, claudication, leg swelling and PND.  Gastrointestinal: Positive for abdominal pain. Negative for heartburn, nausea, vomiting, diarrhea, constipation, blood in stool and melena.  Genitourinary:  Negative for dysuria, urgency, frequency, hematuria and flank pain.  Musculoskeletal: Negative for myalgias, back pain, joint pain and falls.  Skin: Negative for itching and rash.  Neurological: Negative for dizziness, tingling, tremors, sensory change, speech change, focal weakness, seizures, loss of consciousness, weakness and headaches.  Endo/Heme/Allergies: Negative for environmental allergies and polydipsia. Does not bruise/bleed easily.  Psychiatric/Behavioral: Negative for depression, suicidal ideas, hallucinations, memory loss and substance abuse. The patient is not nervous/anxious and does not have insomnia.      PHYSICAL EXAM:  Blood pressure 135/69, pulse 75, temperature 98.2 F (36.8 C), temperature source Oral, resp. rate 20, height 5' 2.01" (1.575 m), weight 110.2 kg, last menstrual period 11/02/2023, SpO2 96%.    Vitals reviewed. Constitutional: She is oriented to person, place, and time. She appears well-developed and well-nourished.  HENT:  Head: Normocephalic and atraumatic.  Right Ear: External ear normal.  Left Ear: External ear normal.  Nose: Nose normal.  Mouth/Throat: Oropharynx is clear and moist.  Eyes: Conjunctivae and EOM are normal. Pupils are equal, round, and reactive to light. Right eye exhibits no discharge. Left eye exhibits no discharge. No scleral icterus.  Neck: Normal range of motion. Neck supple. No tracheal deviation present. No thyromegaly present.  Cardiovascular: Normal rate, regular rhythm, normal heart sounds and intact distal pulses.  Exam reveals no gallop and no friction rub.   No murmur heard. Respiratory: Effort normal and breath sounds normal. No respiratory distress. She has no wheezes. She has no rales. She exhibits no tenderness.  GI: Soft. Bowel sounds are normal. She exhibits no distension and no mass. There is tenderness. There is no rebound and no guarding.  Genitourinary:       Vulva is normal without lesions Vagina is pink  moist without discharge Cervix normal in appearance and pap is normal Uterus is normal size, contour, position, consistency, mobility, non-tender Adnexa is negative with normal sized ovaries by sonogram  Musculoskeletal: Normal range of motion. She exhibits no edema and no tenderness.  Neurological: She is alert and oriented to person, place, and time. She has normal reflexes. She displays normal reflexes. No cranial nerve deficit. She exhibits normal muscle tone. Coordination normal.  Skin: Skin is warm and dry. No rash noted. No erythema. No pallor.  Psychiatric: She has a normal mood and affect. Her behavior is normal. Judgment and thought content normal.    Labs: Results for orders placed or performed during the hospital encounter of 01/09/24 (from the past 2 weeks)  Glucose, capillary   Collection Time: 01/09/24  7:08 AM  Result Value Ref Range   Glucose-Capillary  97 70 - 99 mg/dL  CBC   Collection Time: 01/09/24  7:35 AM  Result Value Ref Range   WBC 11.0 (H) 4.0 - 10.5 K/uL   RBC 5.04 3.87 - 5.11 MIL/uL   Hemoglobin 13.6 12.0 - 15.0 g/dL   HCT 16.1 09.6 - 04.5 %   MCV 81.9 80.0 - 100.0 fL   MCH 27.0 26.0 - 34.0 pg   MCHC 32.9 30.0 - 36.0 g/dL   RDW 40.9 81.1 - 91.4 %   Platelets 396 150 - 400 K/uL   nRBC 0.0 0.0 - 0.2 %  Results for orders placed or performed in visit on 12/30/23 (from the past 2 weeks)  Beta hCG quant (ref lab)   Collection Time: 12/30/23 12:20 PM  Result Value Ref Range   hCG Quant 12,911 mIU/mL  Results for orders placed or performed in visit on 12/29/23 (from the past 2 weeks)  HgB A1c   Collection Time: 12/29/23  8:43 AM  Result Value Ref Range   Hemoglobin A1C 7.9 (A) 4.0 - 5.6 %   HbA1c POC (<> result, manual entry)     HbA1c, POC (prediabetic range)     HbA1c, POC (controlled diabetic range)    POCT UA - Microalbumin   Collection Time: 12/29/23  8:44 AM  Result Value Ref Range   Microalbumin Ur, POC 30 mg/L mg/L   Creatinine, POC 300mg /dL  mg/dL   Albumin/Creatinine Ratio, Urine, POC 30mg /G     EKG: Orders placed or performed during the hospital encounter of 03/24/19   EKG 12-Lead   EKG 12-Lead   EKG    Imaging Studies: US  OB Comp Less 14 Wks Result Date: 01/07/2024 Table formatting from the original result was not included. Images from the original result were not included.  ..an Financial trader of Ultrasound Medicine Technical sales engineer) accredited practice Center for [facility] [address] [place] [address] Ordering Provider: Ozan, Jennifer, DO FOLLOW UP SONOGRAM Claire Harrison is in the office for a follow up sonogram for viability. She is a 28 y.o. year old [phone] with Estimated Date of Delivery: 08/08/24 by LMP now at  [redacted]w[redacted]d weeks gestation. Thus far the pregnancy has been complicated by type 2 diabetes,f/u no fetal heart tones on prior ultrasound . GESTATION: SINGLETON FETAL ACTIVITY:          Heart rate         no fetal pole visualized      CERVIX: Appears closed ADNEXA: The ovaries are normal. GESTATIONAL AGE AND  BIOMETRICS: Gestational criteria: Estimated Date of Delivery: 08/08/24 by LMP now at [redacted]w[redacted]d Previous Scans:1 GESTATIONAL SAC           22.5 mm         7+2 weeks irregular GS CROWN RUMP LENGTH No fetal pole or YS visualized                                                                        AVERAGE EGA(BY THIS SCAN):  7+2 weeks  SUSPECTED ABNORMALITIES:  yes QUALITY OF SCAN: satisfactory TECHNICIAN COMMENTS: [place] TA/TV: 7+2 wks irregular gestational sac,no fetal pole or yolk sac visualized,GS 22.5 mm,normal ovaries,no free fluid,no pain during ultrasound Chaperone Emma A copy of this report including all images has been saved and backed up to a second source for retrieval if needed. All measures and details of the anatomical scan, placentation, fluid volume and pelvic anatomy are contained in that report. Amber Flora Lipps 01/06/2024 4:58 PM Clinical Impression:  Prior US reviewed Early pregnancy loss, miscarriage, with retention of the gestational sac Normal general sonographic findings  This recommendation follows SRU consensus guidelines: Diagnostic Criteria for Nonviable Pregnancy Early in the First Trimester. Malva Limes Med 2013; 409:8119-14.  Myna Hidalgo, DO Attending Obstetrician & Gynecologist, [facility] for [facility], [facility]    US OB Transvaginal Result Date: 01/07/2024 Table formatting from the original result was not included. Images from the original result were not included.  ..an Financial trader of Ultrasound Medicine Technical sales engineer) accredited practice Center for [facility] [address] [place] [address] Ordering Provider: Myna Hidalgo, DO FOLLOW UP SONOGRAM Claire Harrison is in the office for a follow up sonogram for viability. She is a 28 y.o. year old [phone] with Estimated Date of Delivery: 08/08/24 by LMP now at  [redacted]w[redacted]d weeks gestation. Thus far the pregnancy has been complicated by type 2 diabetes,f/u no fetal heart tones on prior ultrasound . GESTATION: SINGLETON FETAL ACTIVITY:          Heart rate         no fetal pole visualized      CERVIX: Appears closed ADNEXA: The ovaries are normal. GESTATIONAL AGE AND  BIOMETRICS: Gestational criteria: Estimated Date of Delivery: 08/08/24 by LMP now at [redacted]w[redacted]d Previous Scans:1 GESTATIONAL SAC           22.5 mm         7+2 weeks irregular GS CROWN RUMP LENGTH No fetal pole or YS visualized                                                                        AVERAGE EGA(BY THIS SCAN):  7+2 weeks                                        SUSPECTED ABNORMALITIES:  yes QUALITY OF SCAN: satisfactory TECHNICIAN COMMENTS: [place] TA/TV: 7+2 wks irregular gestational sac,no fetal pole or yolk sac visualized,GS 22.5 mm,normal ovaries,no free fluid,no pain during ultrasound Chaperone Emma A copy of this report including all images has been saved and backed up  to a second source for retrieval if needed. All measures and details of the anatomical scan, placentation, fluid volume and pelvic anatomy are contained in that report. Amber Flora Lipps 01/06/2024 4:58 PM Clinical Impression: Prior US reviewed Early pregnancy loss, miscarriage, with retention of the gestational sac Normal general sonographic findings  This recommendation follows SRU consensus guidelines: Diagnostic Criteria for Nonviable Pregnancy Early in the First Trimester. Malva Limes Med 2013; 578:4696-29.  Myna Hidalgo, DO Attending Obstetrician & Gynecologist, [facility] for [facility]  Healthcare, [facility]    US OB Comp Less 14 Wks Result Date: 12/30/2023 Table formatting from the original result was not included. Images from the original result were not included.  ..an [employer] of Ultrasound Medicine Technical sales engineer) accredited practice Center for [facility] [address] [place] [address] Ordering Provider: Myna Hidalgo, DO DATING AND VIABILITY SONOGRAM Claire Harrison is a 28 y.o. year old [phone] with LMP Patient's last menstrual period was 11/02/2023 (exact date). which would correlate to  [redacted]w[redacted]d weeks gestation.  She has irregular menstrual cycles.   She is here today for a confirmatory initial sonogram.  Patient states she had an ultrasound performed out of a mobile unit in [place], [place] one week ago and dating was S<D GESTATION: SINGLETON   FETAL ACTIVITY:          no heart motion seen/avascular embryonic pole, irregular sac. Yolk sac appears small = 2.59 mm There is a small sliver of subchorionic hemorrhage seen surrounding the sac.  Patient states she had an ultrasound performed out of a mobile unit in [place], [place] and the dates were the same one week ago.  (2 weeks < dates today)  ? MAB (sent to Lab for Quant. Beta HCG)        CERVIX: Appears closed ADNEXA: The ovaries are normal - neg adnexal regions GESTATIONAL AGE AND  BIOMETRICS: Gestational  criteria: Estimated Date of Delivery: 08/08/24 by LMP now at [redacted]w[redacted]d Previous Scans:1 outside [place]    CROWN RUMP LENGTH           3.92 mm         6+1 weeks                                                                      AVERAGE EGA(BY THIS SCAN):  6+1 weeks  TECHNICIAN COMMENTS: [place]:  LMP 11-02-23 = 8+2 weeks Anteverted uterus with single gestational sac within upper mid cavity.  S < D Small yolk sac = 2.6 mm  CRL = 3.92 = 6+1 weeks  no heart motion seen, avascular embryonic pole with irregular sac.  There is a small sliver of subchorionic hemorrhage seen surrounding the sac.  Patient states she had an ultrasound performed out of a mobile unit in [place], [place] and the dates were the same one week ago.  (2 weeks < dates today)  ? MAB Normal ov's, neg adnexal regions - neg CDS - no free fluid present A copy of this report including all images has been saved and backed up to a second source for retrieval if needed. All measures and details of the anatomical scan, placentation, fluid volume and pelvic anatomy are contained in that report. Wylie Hail 12/30/2023 12:43 PM Early IUP visualized, cardiac activity not seen Intrauterine pregnancy with gestational age by ultrasound of 6 weeks and 1 days which is non-concordant with gestational age by last menstrual period of 8 weeks and 2 days. Small subchorionic hemorrhage seen Recommend repeat pelvic ultrasound in 10-14 days to evaluate for viability Myna Hidalgo, DO Attending Obstetrician & Gynecologist, [facility] for [facility], [facility]   US OB Transvaginal Result Date: 12/30/2023 Table formatting from the original result was  not included. Images from the original result were not included.  ..an [employer] of Ultrasound Medicine Technical sales engineer) accredited practice Center for [facility] [address] [place] [address] Ordering Provider: Ozan, Jennifer, DO DATING AND VIABILITY SONOGRAM Claire Harrison is a 28  y.o. year old [phone] with LMP Patient's last menstrual period was 11/02/2023 (exact date). which would correlate to  [redacted]w[redacted]d weeks gestation.  She has irregular menstrual cycles.   She is here today for a confirmatory initial sonogram.  Patient states she had an ultrasound performed out of a mobile unit in [place], [place] one week ago and dating was S<D GESTATION: SINGLETON   FETAL ACTIVITY:          no heart motion seen/avascular embryonic pole, irregular sac. Yolk sac appears small = 2.59 mm There is a small sliver of subchorionic hemorrhage seen surrounding the sac.  Patient states she had an ultrasound performed out of a mobile unit in [place], [place] and the dates were the same one week ago.  (2 weeks < dates today)  ? MAB (sent to Lab for Quant. Beta HCG)        CERVIX: Appears closed ADNEXA: The ovaries are normal - neg adnexal regions GESTATIONAL AGE AND  BIOMETRICS: Gestational criteria: Estimated Date of Delivery: 08/08/24 by LMP now at [redacted]w[redacted]d Previous Scans:1 outside US     CROWN RUMP LENGTH           3.92 mm         6+1 weeks                                                                      AVERAGE EGA(BY THIS SCAN):  6+1 weeks  TECHNICIAN COMMENTS: US :  LMP 11-02-23 = 8+2 weeks Anteverted uterus with single gestational sac within upper mid cavity.  S < D Small yolk sac = 2.6 mm  CRL = 3.92 = 6+1 weeks  no heart motion seen, avascular embryonic pole with irregular sac.  There is a small sliver of subchorionic hemorrhage seen surrounding the sac.  Patient states she had an ultrasound performed out of a mobile unit in [place], [place] and the dates were the same one week ago.  (2 weeks < dates today)  ? MAB Normal ov's, neg adnexal regions - neg CDS - no free fluid present A copy of this report including all images has been saved and backed up to a second source for retrieval if needed. All measures and details of the anatomical scan, placentation, fluid volume and pelvic anatomy are contained in that report. Claire Harrison  12/30/2023 12:43 PM Early IUP visualized, cardiac activity not seen Intrauterine pregnancy with gestational age by ultrasound of 6 weeks and 1 days which is non-concordant with gestational age by last menstrual period of 8 weeks and 2 days. Small subchorionic hemorrhage seen Recommend repeat pelvic ultrasound in 10-14 days to evaluate for viability Jennifer Ozan, DO Attending Obstetrician & Gynecologist, Faculty Practice Center for [facility], [facility]      Assessment: Early pregnancy loss, anembryonic pregnancy  Plan: D&E, suction + sharp  Wendelyn Halter 01/09/2024 8:40 AM

## 2024-01-09 NOTE — Anesthesia Procedure Notes (Signed)
 Procedure Name: LMA Insertion Date/Time: 01/09/2024 8:53 AM  Performed by: Jana Mcgregor, CRNAPre-anesthesia Checklist: Patient identified, Emergency Drugs available, Suction available, Patient being monitored and Timeout performed Patient Re-evaluated:Patient Re-evaluated prior to induction Oxygen Delivery Method: Circle system utilized Preoxygenation: Pre-oxygenation with 100% oxygen Induction Type: IV induction Ventilation: Mask ventilation without difficulty LMA: LMA inserted LMA Size: 4.0 Number of attempts: 1 Airway Equipment and Method: Patient positioned with wedge pillow Dental Injury: Teeth and Oropharynx as per pre-operative assessment  Comments: LMA # 4 placed with ease - without leak - secured well Positive bilateral breath sounds

## 2024-01-09 NOTE — Transfer of Care (Signed)
 Immediate Anesthesia Transfer of Care Note  Patient: Claire Harrison  Procedure(s) Performed: DILATION AND EVACUATION, UTERUS  Patient Location: PACU  Anesthesia Type:General  Level of Consciousness: awake  Airway & Oxygen Therapy: Patient Spontanous Breathing  Post-op Assessment: Report given to RN  Post vital signs: Reviewed  Last Vitals:  Vitals Value Taken Time  BP 109/54 01/09/24 0930  Temp 36.8 C 01/09/24 0930  Pulse 71 01/09/24 0932  Resp 17 01/09/24 0932  SpO2 94 % 01/09/24 0932  Vitals shown include unfiled device data.  Last Pain:  Vitals:   01/09/24 0715  TempSrc: Oral         Complications: No notable events documented.

## 2024-01-10 ENCOUNTER — Encounter (HOSPITAL_COMMUNITY): Payer: Self-pay | Admitting: Obstetrics & Gynecology

## 2024-01-12 LAB — SURGICAL PATHOLOGY

## 2024-01-22 LAB — ANORA MISCARRIAGE TEST - FRESH

## 2024-01-26 ENCOUNTER — Ambulatory Visit: Admitting: Obstetrics & Gynecology

## 2024-01-26 ENCOUNTER — Encounter: Payer: Self-pay | Admitting: Obstetrics & Gynecology

## 2024-01-26 VITALS — BP 134/83 | HR 74 | Ht 62.0 in | Wt 249.0 lb

## 2024-01-26 DIAGNOSIS — Z9889 Other specified postprocedural states: Secondary | ICD-10-CM

## 2024-01-26 NOTE — Progress Notes (Signed)
  HPI: Patient returns for routine postoperative follow-up having undergone D&C on 01/09/24.  The patient's immediate postoperative recovery has been unremarkable. Since hospital discharge the patient reports no problems.   Current Outpatient Medications: acetaminophen  (TYLENOL ) 500 MG tablet, Take 1,000 mg by mouth every 6 (six) hours as needed for moderate pain (pain score 4-6)., Disp: , Rfl:  ATROVENT HFA 17 MCG/ACT inhaler, Inhale 2 puffs into the lungs 2 (two) times daily as needed for wheezing., Disp: , Rfl:  Continuous Glucose Sensor (DEXCOM G7 SENSOR) MISC, Inject 1 Application into the skin as directed. Change sensor every 10 days as directed., Disp: 9 each, Rfl: 3 glucose blood (ACCU-CHEK GUIDE) test strip, Use as instructed to monitor glucose 4 times daily, Disp: 100 each, Rfl: 12 insulin  glargine (LANTUS ) 100 UNIT/ML Solostar Pen, Inject 80 Units into the skin at bedtime., Disp: 75 mL, Rfl: 3 insulin  lispro (HUMALOG  KWIKPEN) 100 UNIT/ML KwikPen, Inject 20-26 Units into the skin 3 (three) times daily. (Patient taking differently: Inject 20-30 Units into the skin 3 (three) times daily with meals.), Disp: 75 mL, Rfl: 3 Insulin  Pen Needle (CAREFINE PEN NEEDLES) 31G X 6 MM MISC, Use to inject insulin  4 times daily, Disp: 100 each, Rfl: 6 loratadine (CLARITIN) 10 MG tablet, Take 10 mg by mouth daily as needed for allergies., Disp: , Rfl:  ondansetron  (ZOFRAN ) 4 MG tablet, Take 1 tablet (4 mg total) by mouth every 8 (eight) hours as needed., Disp: 30 tablet, Rfl: 1 ondansetron  (ZOFRAN -ODT) 8 MG disintegrating tablet, Take 1 tablet (8 mg total) by mouth every 8 (eight) hours as needed for nausea or vomiting., Disp: 8 tablet, Rfl: 0 HYDROcodone -acetaminophen  (NORCO/VICODIN) 5-325 MG tablet, Take 1 tablet by mouth every 6 (six) hours as needed. (Patient not taking: Reported on 01/26/2024), Disp: 10 tablet, Rfl: 0 ketorolac  (TORADOL ) 10 MG tablet, Take 1 tablet (10 mg total) by mouth every 8 (eight)  hours as needed. (Patient not taking: Reported on 01/26/2024), Disp: 15 tablet, Rfl: 0 methylergonovine (METHERGINE ) 0.2 MG tablet, Take 1 tablet (0.2 mg total) by mouth every 6 (six) hours. (Patient not taking: Reported on 01/26/2024), Disp: 4 tablet, Rfl: 0  No current facility-administered medications for this visit.    Blood pressure 134/83, pulse 74, height 5\' 2"  (1.575 m), weight 249 lb (112.9 kg), last menstrual period 11/02/2023, not currently breastfeeding.  Physical Exam: na  Diagnostic Tests:   Pathology: POC with normal [name]  Impression + Management plan:   ICD-10-CM   1. Post-operative state: S/P D&C for missed ab-->01/09/24  Z98.890           Medications Prescribed this encounter: No orders of the defined types were placed in this encounter.     Follow up: prn   Wendelyn Halter, MD Attending Physician for the [facility] 01/26/2024 9:02 AM

## 2024-02-10 ENCOUNTER — Encounter: Payer: Self-pay | Admitting: Nurse Practitioner

## 2024-02-10 DIAGNOSIS — Z794 Long term (current) use of insulin: Secondary | ICD-10-CM

## 2024-02-10 MED ORDER — INSULIN LISPRO (1 UNIT DIAL) 100 UNIT/ML (KWIKPEN)
22.0000 [IU] | PEN_INJECTOR | Freq: Three times a day (TID) | SUBCUTANEOUS | 3 refills | Status: DC
Start: 2024-02-10 — End: 2024-04-08

## 2024-02-10 MED ORDER — INSULIN GLARGINE 100 UNIT/ML SOLOSTAR PEN: 10.0000 [IU] | PEN_INJECTOR | Freq: Every day | SUBCUTANEOUS | 3 refills | Status: DC

## 2024-03-01 ENCOUNTER — Ambulatory Visit: Admitting: Obstetrics and Gynecology

## 2024-03-30 ENCOUNTER — Other Ambulatory Visit: Payer: Self-pay | Admitting: Nurse Practitioner

## 2024-03-30 ENCOUNTER — Encounter: Payer: Self-pay | Admitting: Nurse Practitioner

## 2024-03-30 ENCOUNTER — Telehealth: Payer: Self-pay | Admitting: Nurse Practitioner

## 2024-03-30 DIAGNOSIS — E1165 Type 2 diabetes mellitus with hyperglycemia: Secondary | ICD-10-CM

## 2024-03-30 DIAGNOSIS — O24111 Pre-existing diabetes mellitus, type 2, in pregnancy, first trimester: Secondary | ICD-10-CM

## 2024-03-30 MED ORDER — INSULIN GLARGINE 100 UNIT/ML SOLOSTAR PEN
70.0000 [IU] | PEN_INJECTOR | Freq: Every day | SUBCUTANEOUS | 3 refills | Status: DC
Start: 2024-03-30 — End: 2024-03-30

## 2024-03-30 MED ORDER — INSULIN GLARGINE 100 UNIT/ML SOLOSTAR PEN: 80.0000 [IU] | PEN_INJECTOR | Freq: Every day | SUBCUTANEOUS | 1 refills | Status: DC

## 2024-03-30 NOTE — Telephone Encounter (Signed)
 She should have refills on file.  I put in order in May for a 3 month supply with refills.  I did go ahead and send refill though.

## 2024-03-30 NOTE — Telephone Encounter (Signed)
 Pt is needing new RX for Lantus  sent to [facility] in [place].  Pt is  currently out.

## 2024-04-05 ENCOUNTER — Encounter: Payer: Self-pay | Admitting: Obstetrics & Gynecology

## 2024-04-05 ENCOUNTER — Ambulatory Visit

## 2024-04-05 VITALS — BP 117/69 | HR 78

## 2024-04-05 DIAGNOSIS — N926 Irregular menstruation, unspecified: Secondary | ICD-10-CM

## 2024-04-05 DIAGNOSIS — Z3201 Encounter for pregnancy test, result positive: Secondary | ICD-10-CM

## 2024-04-05 LAB — POCT URINE PREGNANCY: Preg Test, Ur: POSITIVE — AB

## 2024-04-05 MED ORDER — DOXYLAMINE-PYRIDOXINE 10-10 MG PO TBEC: DELAYED_RELEASE_TABLET | ORAL | 6 refills | Status: DC

## 2024-04-05 NOTE — Progress Notes (Signed)
   NURSE VISIT- PREGNANCY CONFIRMATION   SUBJECTIVE:  Claire Harrison is a 28 y.o. [phone] female at [redacted]w[redacted]d by certain LMP of Patient's last menstrual period was 03/06/2024. Here for pregnancy confirmation.  Home pregnancy test: positive x 5  She reports nausea.  She is taking prenatal vitamins.    OBJECTIVE:  BP 117/69 (BP Location: Right Arm, Patient Position: Sitting, Cuff Size: Normal)   Pulse 78   LMP 03/06/2024   Breastfeeding No   Appears well, in no apparent distress  Results for orders placed or performed in visit on 04/05/24 (from the past 24 hours)  POCT urine pregnancy   Collection Time: 04/05/24  8:45 AM  Result Value Ref Range   Preg Test, Ur Positive (A) Negative    ASSESSMENT: Positive pregnancy test, [redacted]w[redacted]d by LMP    PLAN: Schedule for dating ultrasound in 3-4 weeks Prenatal vitamins: continue   Nausea medicines: requested-note routed to [facility] to send prescription   OB packet given: Yes  Alan LITTIE Fischer  04/05/2024 8:50 AM

## 2024-04-05 NOTE — Addendum Note (Signed)
 Addended by: KIZZIE IHA R on: 04/05/2024 01:05 PM   Modules accepted: Orders

## 2024-04-06 ENCOUNTER — Ambulatory Visit: Admitting: Nurse Practitioner

## 2024-04-08 ENCOUNTER — Encounter: Payer: Self-pay | Admitting: Nurse Practitioner

## 2024-04-08 ENCOUNTER — Ambulatory Visit: Admitting: Nurse Practitioner

## 2024-04-08 VITALS — BP 104/64 | HR 80 | Ht 62.0 in | Wt 262.8 lb

## 2024-04-08 DIAGNOSIS — E1165 Type 2 diabetes mellitus with hyperglycemia: Secondary | ICD-10-CM

## 2024-04-08 DIAGNOSIS — E782 Mixed hyperlipidemia: Secondary | ICD-10-CM

## 2024-04-08 DIAGNOSIS — E66813 Obesity, class 3: Secondary | ICD-10-CM

## 2024-04-08 DIAGNOSIS — Z794 Long term (current) use of insulin: Secondary | ICD-10-CM | POA: Diagnosis not present

## 2024-04-08 DIAGNOSIS — Z6841 Body Mass Index (BMI) 40.0 and over, adult: Secondary | ICD-10-CM

## 2024-04-08 MED ORDER — ACCU-CHEK GUIDE TEST VI STRP: ORAL_STRIP | 12 refills | Status: DC

## 2024-04-08 MED ORDER — INSULIN LISPRO (1 UNIT DIAL) 100 UNIT/ML (KWIKPEN): 26.0000 [IU] | PEN_INJECTOR | Freq: Three times a day (TID) | SUBCUTANEOUS | 3 refills | Status: DC

## 2024-04-08 MED ORDER — DEXCOM G7 SENSOR MISC: 1.0000 | 3 refills | Status: DC

## 2024-04-08 MED ORDER — ACCU-CHEK GUIDE ME W/DEVICE KIT: PACK | 0 refills | Status: DC

## 2024-04-08 NOTE — Progress Notes (Signed)
 Endocrinology Follow Up Note       04/08/2024, 10:45 AM   Subjective:    Patient ID: Claire Harrison, female    DOB: August 11, 1996.  Claire Harrison is being seen in follow up after being seen in consultation for management of currently uncontrolled symptomatic diabetes requested by  Gerome Tillman CROME, FNP.  She was previously seen in this clinic by Dr. Lenis in 2020 but disappeared from care.   Past Medical History:  Diagnosis Date   Anemia    Anxiety    Asthma    Blood transfusion without reported diagnosis    with c/s after delivery   Depression    DM (diabetes mellitus) (HCC)    type 2   GERD (gastroesophageal reflux disease)    hx in past - no current problems   Gestational diabetes    Migraine without aura and without status migrainosus, not intractable    PONV (postoperative nausea and vomiting)    Pre-eclampsia    with pregnancy - all 3    Past Surgical History:  Procedure Laterality Date   CESAREAN SECTION     2018, 2020   CESAREAN SECTION N/A 12/10/2021   Procedure: CESAREAN SECTION;  Surgeon: Alger Gong, MD;  Location: [facility];  Service: Obstetrics;  Laterality: N/A;   DILATION AND CURETTAGE OF UTERUS N/A 08/01/2016   Procedure: SUCTION DILATATION AND CURETTAGE;  Surgeon: Norleen LULLA Server, MD;  Location: [facility];  Service: Gynecology;  Laterality: N/A;   DILATION AND EVACUATION N/A 01/09/2024   Procedure: DILATION AND EVACUATION, UTERUS;  Surgeon: Jayne Vonn DEL, MD;  Location: [facility] OR;  Service: Gynecology;  Laterality: N/A;  [name]   WISDOM TOOTH EXTRACTION      Social History   Socioeconomic History   Marital status: Married    Spouse name: Magdiel   Number of children: 3   Years of education: Not on file   Highest education level: Some college, no degree  Occupational History   Not on file  Tobacco Use   Smoking status: Never   Smokeless tobacco: Never  Vaping Use   Vaping  status: Never Used  Substance and Sexual Activity   Alcohol use: No   Drug use: No   Sexual activity: Yes    Birth control/protection: None    Comment: MAB - gestation 9 wks 2 days  Other Topics Concern   Not on file  Social History Narrative   Lives with family   L handed   Caffeine : 2-3 drinks a week    Social Drivers of Corporate investment banker Strain: Medium Risk (05/01/2022)   Received from [facility]   Overall Financial Resource Strain (CARDIA)    Difficulty of Paying Living Expenses: Somewhat hard  Food Insecurity: No Food Insecurity (02/18/2022)   Received [facility]   Hunger Vital Sign    Within the past 12 months, you worried that your food would run out before you got the money to buy more.: Never true    Within the past 12 months, the food you bought just didn't last and you didn't have money to get more.: Never true  Recent Concern: Food Insecurity -  Food Insecurity Present (01/14/2022)   Hunger Vital Sign    Worried About Running Out of Food in the Last Year: Sometimes true    Ran Out of Food in the Last Year: Patient declined  Transportation Needs: No Transportation Needs (01/20/2022)   Received from [facility]   PRAPARE - Transportation    Lack of Transportation (Medical): No    Lack of Transportation (Non-Medical): No  Physical Activity: Insufficiently Active (01/14/2022)   Exercise Vital Sign    Days of Exercise per Week: 2 days    Minutes of Exercise per Session: 10 min  Stress: Stress Concern Present (01/14/2022)   [employer] of Occupational Health - Occupational Stress Questionnaire    Feeling of Stress : Very much  Social Connections: Socially Integrated (01/14/2022)   Social Connection and Isolation Panel    Frequency of Communication with Friends and Family: More than three times a week    Frequency of Social Gatherings with Friends and Family: Three times a week    Attends Religious Services: 1 to 4 times per year    Active  Member of Clubs or Organizations: No    Attends Engineer, structural: 1 to 4 times per year    Marital Status: Married    Family History  Problem Relation Age of Onset   Diabetes Mother    Hypertension Mother    Kidney failure Mother    Miscarriages / [place] Mother    Diabetes Father    Diabetes Sister    Miscarriages / [place] Sister    Diabetes Sister    Miscarriages / Stillbirths Sister    Scoliosis Brother    Heart murmur Daughter    Bronchitis Son    Colon cancer Maternal Aunt    Diabetes Maternal Grandmother    Kidney failure Maternal Grandmother     Outpatient Encounter Medications as of 04/08/2024  Medication Sig   acetaminophen  (TYLENOL ) 500 MG tablet Take 1,000 mg by mouth every 6 (six) hours as needed for moderate pain (pain score 4-6).   ATROVENT HFA 17 MCG/ACT inhaler Inhale 2 puffs into the lungs 2 (two) times daily as needed for wheezing.   Blood Glucose Monitoring Suppl (ACCU-CHEK GUIDE ME) w/Device KIT Use to check glucose 4 times daily, use as back up to CGM   Doxylamine -Pyridoxine  (DICLEGIS ) 10-10 MG TBEC 2 tabs q hs, if sx persist add 1 tab q am on day 3, if sx persist add 1 tab q afternoon on day 4   glucose blood (ACCU-CHEK GUIDE TEST) test strip Use as instructed to monitor glucose 4 times daily   glucose blood (ACCU-CHEK GUIDE) test strip Use as instructed to monitor glucose 4 times daily   insulin  glargine (LANTUS ) 100 UNIT/ML Solostar Pen Inject 80 Units into the skin at bedtime.   Insulin  Pen Needle (CAREFINE PEN NEEDLES) 31G X 6 MM MISC Use to inject insulin  4 times daily   loratadine (CLARITIN) 10 MG tablet Take 10 mg by mouth daily as needed for allergies.   [DISCONTINUED] Continuous Glucose Sensor (DEXCOM G7 SENSOR) MISC Inject 1 Application into the skin as directed. Change sensor every 10 days as directed.   [DISCONTINUED] insulin  lispro (HUMALOG  KWIKPEN) 100 UNIT/ML KwikPen Inject 22-28 Units into the skin 3 (three) times daily.    Continuous Glucose Sensor (DEXCOM G7 SENSOR) MISC Inject 1 Application into the skin as directed. Change sensor every 10 days as directed.   HYDROcodone -acetaminophen  (NORCO/VICODIN) 5-325 MG tablet Take 1 tablet by mouth  every 6 (six) hours as needed. (Patient not taking: Reported on 04/08/2024)   insulin  lispro (HUMALOG  KWIKPEN) 100 UNIT/ML KwikPen Inject 26-32 Units into the skin 3 (three) times daily.   ketorolac  (TORADOL ) 10 MG tablet Take 1 tablet (10 mg total) by mouth every 8 (eight) hours as needed. (Patient not taking: Reported on 04/08/2024)   methylergonovine (METHERGINE ) 0.2 MG tablet Take 1 tablet (0.2 mg total) by mouth every 6 (six) hours. (Patient not taking: Reported on 04/08/2024)   ondansetron  (ZOFRAN ) 4 MG tablet Take 1 tablet (4 mg total) by mouth every 8 (eight) hours as needed. (Patient not taking: Reported on 04/08/2024)   ondansetron  (ZOFRAN -ODT) 8 MG disintegrating tablet Take 1 tablet (8 mg total) by mouth every 8 (eight) hours as needed for nausea or vomiting. (Patient not taking: Reported on 04/08/2024)   No facility-administered encounter medications on file as of 04/08/2024.    ALLERGIES: Allergies  Allergen Reactions   Bee Venom Hives   Penicillins Anaphylaxis   Mango Flavoring Agent (Non-Screening) Hives and Swelling   Peanut-Containing Drug Products Hives and Swelling   Latex Rash    VACCINATION STATUS: Immunization History  Administered Date(s) Administered   Influenza,inj,Quad PF,6+ Mos 10/13/2016, 07/03/2020, 07/30/2021   Moderna Sars-Covid-2 Vaccination 03/05/2020, 04/02/2020, 11/19/2020   Tdap 10/01/2021    Diabetes She presents for her follow-up diabetic visit. She has type 2 diabetes mellitus. Onset time: diagnosed at age 32-gestational first. Her disease course has been fluctuating. There are no hypoglycemic associated symptoms. Associated symptoms include blurred vision, fatigue, foot paresthesias and visual change (seeing floaters). Pertinent  negatives for diabetes include no polydipsia, no polyuria and no weight loss. There are no hypoglycemic complications. Required assistance: EMS was called for low event in the 40s. Symptoms are stable. Diabetic complications include peripheral neuropathy. Risk factors for coronary artery disease include diabetes mellitus, obesity, sedentary lifestyle and stress. Current diabetic treatment includes intensive insulin  program. She is compliant with treatment most of the time. Her weight is increasing steadily. She is following a generally unhealthy diet. When asked about meal planning, she reported none. She has not had a previous visit with a dietitian. She participates in exercise intermittently. Her home blood glucose trend is fluctuating dramatically. Her overall blood glucose range is >200 mg/dl. (She presents today, accompanied with her 3 children, with her CGM showing dramatically fluctuating glycemic profile, over target.  Her most recent A1c on 6/3 was 8.6%, increasing from last visit of 7.9%.  Analysis of her CGM shows TIR 27%, TAR 73%, TBR 0% with a GMI of 8.7%.  She just found out recently that she is pregnant, after suffering a miscarriage.  She admits she has been taking more insulin  than what is prescribed to help combat higher readings.  She also notes she is trying to eat healthier.) An ACE inhibitor/angiotensin II receptor blocker is not being taken. She does not see a podiatrist.Eye exam is not current.    Review of systems  Constitutional: + increasing body weight,  current Body mass index is 48.07 kg/m. , + fatigue, no subjective hyperthermia, no subjective hypothermia Eyes: no blurry vision, no xerophthalmia ENT: no sore throat, no nodules palpated in throat, no dysphagia/odynophagia, no hoarseness Cardiovascular: no chest pain, no shortness of breath, no palpitations, no leg swelling Respiratory: no cough, no shortness of breath Gastrointestinal: no  nausea/vomiting/diarrhea Musculoskeletal: no muscle/joint aches Skin: no rashes, no hyperemia Neurological: no tremors, no numbness, no tingling, no dizziness Psychiatric: no depression, no anxiety  Objective:  BP 104/64 (BP Location: Right Arm, Patient Position: Sitting, Cuff Size: Large)   Pulse 80   Ht 5' 2 (1.575 m)   Wt 262 lb 12.8 oz (119.2 kg)   LMP 03/06/2024   BMI 48.07 kg/m   Wt Readings from Last 3 Encounters:  04/08/24 262 lb 12.8 oz (119.2 kg)  01/26/24 249 lb (112.9 kg)  01/09/24 243 lb (110.2 kg)     BP Readings from Last 3 Encounters:  04/08/24 104/64  04/05/24 117/69  01/26/24 134/83      Physical Exam- Limited  Constitutional:  Body mass index is 48.07 kg/m. , not in acute distress, normal state of mind Eyes:  EOMI, no exophthalmos Musculoskeletal: no gross deformities, strength intact in all four extremities, no gross restriction of joint movements Skin:  no rashes, no hyperemia Neurological: no tremor with outstretched hands   Diabetic Foot Exam - Simple   No data filed     CMP ( most recent) CMP     Component Value Date/Time   NA 135 01/09/2024 0734   NA 137 11/26/2021 1126   K 3.8 01/09/2024 0734   CL 102 01/09/2024 0734   CO2 19 (L) 01/09/2024 0734   GLUCOSE 99 01/09/2024 0734   BUN 12 01/09/2024 0734   BUN 9 11/26/2021 1126   CREATININE 0.54 01/09/2024 0734   CALCIUM 9.3 01/09/2024 0734   PROT 6.0 (L) 12/10/2021 1616   PROT 6.2 11/26/2021 1126   ALBUMIN 2.3 (L) 12/10/2021 1616   ALBUMIN 3.4 (L) 11/26/2021 1126   AST 18 12/10/2021 1616   ALT 15 12/10/2021 1616   ALKPHOS 115 12/10/2021 1616   BILITOT <0.1 (L) 12/10/2021 1616   BILITOT <0.2 11/26/2021 1126   GFRNONAA >60 01/09/2024 0734     Diabetic Labs (most recent): Lab Results  Component Value Date   HGBA1C 7.9 (A) 12/29/2023   HGBA1C 8.0 (A) 09/29/2023   HGBA1C 7.6 (A) 06/18/2023   MICROALBUR 30 mg/L 12/29/2023   MICROALBUR 150 mg/L 01/15/2023     Lipid  Panel ( most recent) Lipid Panel  No results found for: CHOL, TRIG, HDL, CHOLHDL, VLDL, LDLCALC, LDLDIRECT, LABVLDL    Lab Results  Component Value Date   TSH 2.130 07/01/2021           Assessment & Plan:   1) Type 2 diabetes mellitus with hyperglycemia, with long-term current use of insulin  (HCC)  She presents today, accompanied with her 3 children, with her CGM showing dramatically fluctuating glycemic profile, over target.  Her most recent A1c on 6/3 was 8.6%, increasing from last visit of 7.9%.  Analysis of her CGM shows TIR 27%, TAR 73%, TBR 0% with a GMI of 8.7%.  She just found out recently that she is pregnant, after suffering a miscarriage.  She admits she has been taking more insulin  than what is prescribed to help combat higher readings.  She also notes she is trying to eat healthier.  - Witney Huie has currently uncontrolled symptomatic type 2 DM since 28 years of age.   -Recent labs reviewed.   - I had a long discussion with her about the progressive nature of diabetes and the pathology behind its complications. -her diabetes is not currently complicated but she remains at a high risk for more acute and chronic complications which include CAD, CVA, CKD, retinopathy, and neuropathy. These are all discussed in detail with her.  The following Lifestyle Medicine recommendations according to American College of Lifestyle Medicine [facility]) were discussed and  offered to patient and she agrees to start the journey:  A. Whole Foods, Plant-based plate comprising of fruits and vegetables, plant-based proteins, whole-grain carbohydrates was discussed in detail with the patient.   A list for source of those nutrients were also provided to the patient.  Patient will use only water or unsweetened tea for hydration. B.  The need to stay away from risky substances including alcohol, smoking; obtaining 7 to 9 hours of restorative sleep, at least 150 minutes of moderate  intensity exercise weekly, the importance of healthy social connections,  and stress reduction techniques were discussed. C.  A full color page of  Calorie density of various food groups per pound showing examples of each food groups was provided to the patient.  - Nutritional counseling repeated at each appointment due to patients tendency to fall back in to old habits.  - The patient admits there is a room for improvement in their diet and drink choices. -  Suggestion is made for the patient to avoid simple carbohydrates from their diet including Cakes, Sweet Desserts / Pastries, Ice Cream, Soda (diet and regular), Sweet Tea, Candies, Chips, Cookies, Sweet Pastries, Store Bought Juices, Alcohol in Excess of 1-2 drinks a day, Artificial Sweeteners, Coffee Creamer, and Sugar-free Products. This will help patient to have stable blood glucose profile and potentially avoid unintended weight gain.   - I encouraged the patient to switch to unprocessed or minimally processed complex starch and increased protein intake (animal or plant source), fruits, and vegetables.   - Patient is advised to stick to a routine mealtimes to eat 3 meals a day and avoid unnecessary snacks (to snack only to correct hypoglycemia).  - I have approached her with the following individualized plan to manage her diabetes and patient agrees:   -She is advised to continue Lantus  80 units SQ nightly and adjust her Humalog  to 26-32 units TID with meals if glucose is above 60 and she is eating (Specific instructions on how to titrate insulin  dosage based on glucose readings given to patient in writing).   She will need tighter control of her glucose now that she is pregnant to ensure healthy development of fetus.  She is warned NOT to take more than the prescribed doses for safety purposes.  -We did talk about initiation of Omnipod 5 but I am somewhat hesitant due to the parameters on the device not being able to be adjusted to  tighter controls during pregnancy.      -she is encouraged to continue monitoring glucose 4 times daily (using her CGM), before meals and before bed, and to call the clinic if she has readings less than 60 or above 200 for 3 tests in a row.  I asked her to send glucose readings in 4 weeks so we can adjust insulins if needed.  - she is warned not to take insulin  without proper monitoring per orders. - Adjustment parameters are given to her for hypo and hyperglycemia in writing.  - she will not be good candidate for GLP1 therapy as originally thought as she found out her aunt previously had thyroid cancer, making this too high risk for her.  She asked about Trulicity today which is in this same class.    -She did not go through with the bariatric weight loss surgery, as she wanted to get better control of her glucose first.  - Specific targets for  A1c; LDL, HDL, and Triglycerides were discussed with the patient.  2) Blood Pressure /  Hypertension:  her blood pressure is controlled to target.   she is advised to continue her current medications including Nifedipine  30 mg p.o. daily with breakfast.  3) Lipids/Hyperlipidemia:    Her recent lipid panel from 12/31/22 shows controlled LDL of 83 and slightly elevated triglycerides of 186.  She is not currently on any lipid lowering medications.  She is advised to adopt low fat diet and increase exercise when able.  4)  Weight/Diet:  her Body mass index is 48.07 kg/m.  -  clearly complicating her diabetes care.   she is a candidate for weight loss. I discussed with her the fact that loss of 5 - 10% of her  current body weight will have the most impact on her diabetes management.  Exercise, and detailed carbohydrates information provided  -  detailed on discharge instructions.  She asked about other weight loss medications today including GLP1 product (which we have not used due to positive thyroid cancer family history).  I do not consider her a good  candidate for Phentermine either due to frequent headaches.  She asked about referral for bariatric surgery and I did initiate that for her today.  I also recommended she keep a food diary for us  to review at her next visit.  5) Vitamin D  deficiency Her recent vitamin D  level on 12/31/22 was low at 10.8.  She is not currently on any supplementation, has not been taking anything extra right now due to pregnancy.   6) Chronic Care/Health Maintenance: -she is not on ACEI/ARB or Statin medications and is encouraged to initiate and continue to follow up with Ophthalmology, Dentist, Podiatrist at least yearly or according to recommendations, and advised to stay away from smoking. I have recommended yearly flu vaccine and pneumonia vaccine at least every 5 years; moderate intensity exercise for up to 150 minutes weekly; and sleep for at least 7 hours a day.  - she is advised to maintain close follow up with Gerome Tillman CROME, FNP for primary care needs, as well as her other providers for optimal and coordinated care.       I spent  53  minutes in the care of the patient today including review of labs from CMP, Lipids, Thyroid Function, Hematology (current and previous including abstractions from other facilities); face-to-face time discussing  her blood glucose readings/logs, discussing hypoglycemia and hyperglycemia episodes and symptoms, medications doses, her options of short and long term treatment based on the latest standards of care / guidelines;  discussion about incorporating lifestyle medicine;  and documenting the encounter. Risk reduction counseling performed per USPSTF guidelines to reduce obesity and cardiovascular risk factors.     Please refer to Patient Instructions for Blood Glucose Monitoring and Insulin /Medications Dosing Guide  in media tab for additional information. Please  also refer to  Patient Self Inventory in the Media  tab for reviewed elements of pertinent patient  history.  Caroleen Florence participated in the discussions, expressed understanding, and voiced agreement with the above plans.  All questions were answered to her satisfaction. she is encouraged to contact clinic should she have any questions or concerns prior to her return visit.     Follow up plan: - Return in about 3 months (around 07/09/2024) for Diabetes F/U with A1c in office, Bring meter and logs.   Benton Rio, [facility] [address] [place], [place] [address] Phone: [phone] Fax: [phone]  04/08/2024, 10:45 AM

## 2024-04-18 NOTE — ED Provider Notes (Signed)
 Emergency Department Provider Note    ED Clinical Impression   Final diagnoses:  Abdominal pain during pregnancy in first trimester (HHS-HCC) (Primary)    ED Assessment/Plan    Condition: Stable Disposition: Pending  This chart has been completed using Engineer, civil (consulting) software, and while attempts have been made to ensure accuracy, certain words and phrases may not be transcribed as intended.   History   Chief Complaint  Patient presents with  . Abdominal Pain   HPI  Claire Harrison is a 28 y.o. female  who presents today to the  emergency department complaining of symptoms ongoing 4 days.  She reports she is pregnant, in the first trimester, she has not had her initial appointment or ultrasound yet.   She complains of low abdominal cramping that seems to be worsening, she has had some nausea, and  denies any vaginal bleeding, discharge or change in bowel/bladder habits. On exam the patient is alert and oriented x4 in nad with even and regular respirations skin warm and dry mucous membranes moist. LMP 03/06/24, per ON note pt is [id number]   Allergies: is allergic to mango flavor; penicillins; tree nuts; venom-honey bee; latex, natural rubber; mango; and peanut. Medications: has a current medication list which includes the following long-term medication(s): dexcom g7 sensor, insulin  glargine, insulin  lispro, and atrovent hfa. PMHx:  has a past medical history of Anxiety and depression (06/18/2018), Gestational diabetes (HHS-HCC) (2018), Mild intermittent asthma (HHS-HCC), and Type 2 diabetes mellitus without complication, without long-term current use of insulin  (06/13/2018). PSHx:  has a past surgical history that includes Dilation and curettage of uterus (2017) and Cesarean section (04/29/2017). SocHx:  reports that she has never smoked. She has never been exposed to tobacco smoke. She  has never used smokeless tobacco. She reports that she does not currently use alcohol. She reports that she does not use drugs. Allergies, Medications, Medical, Surgical, and Social History were reviewed as documented above.   Social Drivers of Health with Concerns   Food Insecurity: No Food Insecurity (02/18/2022)   Hunger Vital Sign   . Worried About Programme researcher, broadcasting/film/video in the Last Year: Never true   . Ran Out of Food in the Last Year: Never true  Recent Concern: Food Insecurity - Food Insecurity Present (01/14/2022)   Received from [facility]   Hunger Vital Sign   . Within the past 12 months, you worried that your food would run out before you got the money to buy more.: Sometimes true   . Within the past 12 months, the food you bought just didn't last and you didn't have money to get more.: Patient declined  Housing: Not on file  Physical Activity: Insufficiently Active (01/14/2022)   Received from [facility]   Exercise Vital Sign   . On average, how many days per week do you engage in moderate to strenuous exercise (like a brisk walk)?: 2 days   . On average, how many minutes do you engage in exercise at this level?: 10 min  Utilities: Not on file  Stress: Stress Concern Present (01/14/2022)   Received from [facility] of Occupational Health - Occupational Stress Questionnaire   . Feeling of Stress : Very much  Interpersonal Safety: Not on file  Substance Use: Not  on file (08/04/2023)  Financial Resource Strain: Medium Risk (05/01/2022)   Overall Financial Resource Strain (CARDIA)   . Difficulty of Paying Living Expenses: Somewhat hard  Internet Connectivity: Not on file     Review Of Systems  Review of Systems  Constitutional:  Negative for chills and fever.  Respiratory:  Negative for shortness of breath.   Cardiovascular:  Negative for chest pain.  Gastrointestinal:  Positive for abdominal pain and nausea. Negative for diarrhea and vomiting.   Genitourinary:  Negative for dysuria, vaginal bleeding and vaginal discharge.  Musculoskeletal:  Negative for back pain.  Neurological:  Negative for dizziness and headaches.    Physical Exam   BP 142/65   Pulse 93   Temp 37 C (98.6 F) (Oral)   Resp 20   Ht 157.5 cm (5' 2)   Wt (!) 117.9 kg (260 lb)   SpO2 100%   BMI 47.55 kg/m   Physical Exam Vitals and nursing note reviewed.  Constitutional:      Appearance: Normal appearance. She is well-developed. She is obese.  Cardiovascular:     Rate and Rhythm: Normal rate and regular rhythm.     Heart sounds: Normal heart sounds.  Pulmonary:     Effort: Pulmonary effort is normal.     Breath sounds: Normal breath sounds.  Abdominal:     General: Bowel sounds are normal.     Palpations: Abdomen is soft.     Tenderness: There is no abdominal tenderness.  Skin:    General: Skin is warm and dry.     Capillary Refill: Capillary refill takes 2 to 3 seconds.  Neurological:     General: No focal deficit present.     Mental Status: She is alert and oriented to person, place, and time.  Psychiatric:        Mood and Affect: Mood normal.        Behavior: Behavior normal.     ED Course  Medical Decision Making Amount and/or Complexity of Data Reviewed Labs: ordered. Decision-making details documented in ED Course. Radiology: ordered.    ED Course as of 04/18/24 2250  Mon Apr 18, 2024  2019 CBC w/ Differential(!):   WBC 13.7(!)  RBC 4.69  HGB 12.9  HCT 37.0  MCV 78.9(!)  MCH 27.5  MCHC 34.9  RDW 12.8  MPV 10.5(!)  Platelet 392  Neutrophils % 69.8  Lymphocytes % 23.4  Monocytes % 4.9  Eosinophils % 1.2  Basophils % 0.3  Absolute Neutrophils 9.6(!)  Absolute Lymphocytes 3.2  Absolute Monocytes  0.7  Absolute Eosinophils 0.2  Absolute Basophils  0.0  2112 Comprehensive Metabolic Panel(!):   Sodium 137  Potassium 3.7  Chloride 104  CO2 22.7  Anion Gap 10  Bun 13  Creatinine 0.49(!)  BUN/Creatinine Ratio 27   eGFR CKD-EPI (2021) Female >90  Glucose 103  Calcium 8.7  Albumin 3.6  Total Protein 7.5  Total Bilirubin 0.2(!)  SGOT (AST) 9(!)  ALT 22  Alkaline Phosphatase 78  2113 Urinalysis with Microscopy with Culture Reflex (Clean Catch)(!):   Color, UA Light Yellow  Clarity, UA Clear  Spec Grav, UA 1.028(!)  pH, UA 6.0  Leukocyte Esterase, UA Negative  Nitrite, UA Negative  Protein, UA Trace(!)  Glucose, UA Negative  Ketones, UA Negative  Urobilinogen, UA <2.0 mg/dL  Bilirubin, UA Negative  Blood, UA Negative  RBC, UA 1  WBC, UA 0  Squam Epithel, UA 5  Bacteria, UA Rare(!)  WBC Clumps None Seen  Hyphal Yeast None Seen  Yeast, UA None Seen  2120 hCG QUANTitative, Blood:   hCG Quant 12,633.0  2249 Awaiting ultrasound, care transferred to Dr Dallara due to end of shift     Procedures   No results found for this visit on 04/18/24 (from the past 4464 hours).   ED Results Results for orders placed or performed during the hospital encounter of 04/18/24  Comprehensive Metabolic Panel  Result Value Ref Range   Sodium 137 135 - 145 mmol/L   Potassium 3.7 3.5 - 5.0 mmol/L   Chloride 104 98 - 107 mmol/L   CO2 22.7 21.0 - 32.0 mmol/L   Anion Gap 10 3 - 11 mmol/L   BUN 13 8 - 20 mg/dL   Creatinine 9.50 (L) 9.39 - 1.10 mg/dL   BUN/Creatinine Ratio 27    eGFR CKD-EPI (2021) Female >90 >=60 mL/min/1.38m2   Glucose 103 70 - 179 mg/dL   Calcium 8.7 8.5 - 89.8 mg/dL   Albumin 3.6 3.5 - 5.0 g/dL   Total Protein 7.5 6.0 - 8.0 g/dL   Total Bilirubin 0.2 (L) 0.3 - 1.2 mg/dL   AST 9 (L) 15 - 40 U/L   ALT 22 12 - 78 U/L   Alkaline Phosphatase 78 46 - 116 U/L  hCG QUANTitative, Blood  Result Value Ref Range   hCG Quantitative 12,633.0 mIU/mL  CBC w/ Differential  Result Value Ref Range   WBC 13.7 (H) 4.0 - 10.5 10*9/L   RBC 4.69 3.80 - 5.10 10*12/L   HGB 12.9 11.5 - 15.0 g/dL   HCT 62.9 65.9 - 55.9 %   MCV 78.9 (L) 80.0 - 98.0 fL   MCH 27.5 27.0 - 34.0 pg   MCHC 34.9 32.0 -  36.0 g/dL   RDW 87.1 88.4 - 85.4 %   MPV 10.5 (H) 7.4 - 10.4 fL   Platelet 392 140 - 415 10*9/L   Neutrophils % 69.8 %   Lymphocytes % 23.4 %   Monocytes % 4.9 %   Eosinophils % 1.2 %   Basophils % 0.3 %   Absolute Neutrophils 9.6 (H) 1.8 - 7.8 10*9/L   Absolute Lymphocytes 3.2 0.7 - 4.5 10*9/L   Absolute Monocytes 0.7 0.1 - 1.0 10*9/L   Absolute Eosinophils 0.2 0.0 - 0.4 10*9/L   Absolute Basophils 0.0 0.0 - 0.2 10*9/L  Urinalysis with Microscopy with Culture Reflex  Result Value Ref Range   Color, UA Light Yellow    Clarity, UA Clear Clear   Specific Gravity, UA 1.028 (H) 1.010 - 1.025   pH, UA 6.0 5.0 - 8.0   Leukocyte Esterase, UA Negative Negative   Nitrite, UA Negative Negative   Protein, UA Trace (A) Negative   Glucose, UA Negative Negative, Trace   Ketones, UA Negative Negative   Urobilinogen, UA <2.0 mg/dL <7.9 mg/dL   Bilirubin, UA Negative Negative   Blood, UA Negative Negative   RBC, UA 1 0 - 3 /HPF   WBC, UA 0 0 - 3 /HPF   Squam Epithel, UA 5 0 - 10 /HPF   Bacteria, UA Rare (A) None Seen /HPF   WBC Clumps None Seen None Seen /HPF   Hyphal Yeast None Seen None Seen /HPF   Yeast, UA None Seen None Seen /HPF   No results found.  Medications Administered: Medications - No data to display  Discharge Medications (Medications Prescribed during this  ED visit and Patient's Home Medications) :    Your Medication List  STOP taking these medications    etonogestrel -ethinyl estradiol 0.12-0.015 mg/24 hr vaginal ring Commonly known as: NUVARING       ASK your doctor about these medications    ACCU-CHEK GUIDE TEST STRIPS Strp Generic drug: blood sugar diagnostic USE TO CHECK BLOOD SUGAR 4 times a day   blood sugar diagnostic Strp by Other route.   ATROVENT HFA 17 mcg/actuation inhaler Generic drug: ipratropium Inhale 2 puffs two (2) times a day. As needed   chlorhexidine  0.12 % solution Commonly known as: PERIDEX  15 mL by Oromucosal route two (2)  times a day.   DEXCOM G7 SENSOR Devi Generic drug: blood-glucose sensor CHANGE SENSOR EVERY 10 DAYS AS DIRECTED   ibuprofen  600 MG tablet Commonly known as: MOTRIN  Take 1 tablet (600 mg total) by mouth every six (6) hours as needed. As needed   insulin  glargine 100 unit/mL (3 mL) injection pen Commonly known as: LANTUS  SOLOSTAR U-100 INSULIN  Inject 0.6 mL (60 Units total) under the skin Two (2) times a day.   insulin  lispro 100 unit/mL injection pen Commonly known as: HumaLOG  KwikPen Insulin  INJECT SUBQ PER SLIDING SCALE 3 TIMES A DAY. Up to 15 units a day   INSULIN  PEN NEEDLE MISC Use as directed up to 4 times daily with lantus  and humalog    lancets Misc Commonly known as: ACCU-CHEK SOFTCLIX LANCETS Check sugars 4 times a day Dx E11.9   multivitamin, prenatal (folic acid-iron) 27-1 mg Tab Take 1 tablet by mouth daily.   pen needle, diabetic 32 gauge x 5/32 (4 mm) Ndle Commonly known as: PEN NEEDLE USE 2 TIMES A DAY WITH INSULIN .          Gerome Rosaline Ruth, [place] 04/18/24 [phone]

## 2024-04-18 NOTE — Nursing Note (Signed)
 Called patient to treatment area - no answer.

## 2024-04-18 NOTE — ED Provider Notes (Addendum)
 2150 hrs. Assumed care at shift change briefly patient 28 year old for rule out ectopic quant is 12,000 she is mid first trimester awaiting results of ultrasound previous miscarriage back in April at 8 weeks now she thinks she is [redacted] weeks pregnant currently no vaginal bleeding looks well clinically  2400 hrs. Wet reading of ultrasound suggest intrauterine pregnancy relatively early 5 to 6 weeks consistent with patient's dates outpatient treatment and referral likely Ultrasound of report confirms 6-week IUP outpatient referral

## 2024-04-19 ENCOUNTER — Encounter: Payer: Self-pay | Admitting: Women's Health

## 2024-04-29 ENCOUNTER — Other Ambulatory Visit: Payer: Self-pay | Admitting: Obstetrics & Gynecology

## 2024-04-29 DIAGNOSIS — O3680X0 Pregnancy with inconclusive fetal viability, not applicable or unspecified: Secondary | ICD-10-CM

## 2024-04-29 NOTE — Addendum Note (Signed)
 Addended by: Vonda Harth J on: 04/29/2024 07:20 AM   Modules accepted: Orders

## 2024-05-02 ENCOUNTER — Other Ambulatory Visit: Payer: Self-pay | Admitting: Obstetrics & Gynecology

## 2024-05-02 ENCOUNTER — Ambulatory Visit

## 2024-05-02 ENCOUNTER — Ambulatory Visit: Admitting: Advanced Practice Midwife

## 2024-05-02 DIAGNOSIS — O3680X0 Pregnancy with inconclusive fetal viability, not applicable or unspecified: Secondary | ICD-10-CM

## 2024-05-02 DIAGNOSIS — O034 Incomplete spontaneous abortion without complication: Secondary | ICD-10-CM

## 2024-05-02 DIAGNOSIS — Z3A01 Less than 8 weeks gestation of pregnancy: Secondary | ICD-10-CM

## 2024-05-02 DIAGNOSIS — Z3A08 8 weeks gestation of pregnancy: Secondary | ICD-10-CM | POA: Diagnosis not present

## 2024-05-02 DIAGNOSIS — O021 Missed abortion: Secondary | ICD-10-CM

## 2024-05-02 MED ORDER — MISOPROSTOL 200 MCG PO TABS: 600.0000 ug | ORAL_TABLET | ORAL | 1 refills | Status: DC | PRN

## 2024-05-02 MED ORDER — KETOROLAC TROMETHAMINE 10 MG PO TABS: 10.0000 mg | ORAL_TABLET | Freq: Four times a day (QID) | ORAL | 0 refills | Status: DC | PRN

## 2024-05-02 NOTE — Progress Notes (Signed)
 [facility] Clinic Visit  Patient name: Claire Harrison MRN [id number]  Date of birth: [birth year]  CC & HPI:  Claire Harrison is a 28 y.o.  female presenting today for dating US .  Unfortunately, there was no cardiac activity on a fetus measuring 7.4 weeks.  States had US  last week at another facility that showed FCA w/and EDC c/w at 8.1 week fetus today.  Had early MAB 4/25 and elected for a D&C. Options: (wait and see/meds/surgery/repeat US  next week) discussed, elected to go ahead w/cytotec .  Wants testing for possible maternal abnormalities w/her anora test.   Pertinent History Reviewed:  Medical & Surgical Hx:   Past Medical History:  Diagnosis Date   Anemia    Anxiety    Asthma    Blood transfusion without reported diagnosis    with c/s after delivery   Depression    DM (diabetes mellitus) (HCC)    type 2   GERD (gastroesophageal reflux disease)    hx in past - no current problems   Gestational diabetes    Migraine without aura and without status migrainosus, not intractable    PONV (postoperative nausea and vomiting)    Pre-eclampsia    with pregnancy - all 3   Past Surgical History:  Procedure Laterality Date   CESAREAN SECTION     2018, 2020   CESAREAN SECTION N/A 12/10/2021   Procedure: CESAREAN SECTION;  Surgeon: Alger Gong, MD;  Location: [facility] LD ORS;  Service: Obstetrics;  Laterality: N/A;   DILATION AND CURETTAGE OF UTERUS N/A 08/01/2016   Procedure: SUCTION DILATATION AND CURETTAGE;  Surgeon: Norleen LULLA Server, MD;  Location: [facility] ORS;  Service: Gynecology;  Laterality: N/A;   DILATION AND EVACUATION N/A 01/09/2024   Procedure: DILATION AND EVACUATION, UTERUS;  Surgeon: Jayne Vonn DEL, MD;  Location: [facility] OR;  Service: Gynecology;  Laterality: N/A;  [name]   WISDOM TOOTH EXTRACTION     Family History  Problem Relation Age of Onset   Diabetes Mother    Hypertension Mother    Kidney failure Mother    Miscarriages / [place] Mother    Diabetes Father     Diabetes Sister    Miscarriages / [place] Sister    Diabetes Sister    Miscarriages / Stillbirths Sister    Scoliosis Brother    Heart murmur Daughter    Bronchitis Son    Colon cancer Maternal Aunt    Diabetes Maternal Grandmother    Kidney failure Maternal Grandmother     Current Outpatient Medications:    ketorolac  (TORADOL ) 10 MG tablet, Take 1 tablet (10 mg total) by mouth every 6 (six) hours as needed., Disp: 20 tablet, Rfl: 0   misoprostol  (CYTOTEC ) 200 MCG tablet, Place 3 tablets (600 mcg total) vaginally as needed (Repeat in 48 hours as needed)., Disp: 3 tablet, Rfl: 1   acetaminophen  (TYLENOL ) 500 MG tablet, Take 1,000 mg by mouth every 6 (six) hours as needed for moderate pain (pain score 4-6)., Disp: , Rfl:    ATROVENT HFA 17 MCG/ACT inhaler, Inhale 2 puffs into the lungs 2 (two) times daily as needed for wheezing., Disp: , Rfl:    Blood Glucose Monitoring Suppl (ACCU-CHEK GUIDE ME) w/Device KIT, Use to check glucose 4 times daily, use as back up to CGM, Disp: 1 kit, Rfl: 0   Continuous Glucose Sensor (DEXCOM G7 SENSOR) MISC, Inject 1 Application into the skin as directed. Change sensor every 10 days as directed., Disp: 9 each, Rfl: 3  Doxylamine -Pyridoxine  (DICLEGIS ) 10-10 MG TBEC, 2 tabs q hs, if sx persist add 1 tab q am on day 3, if sx persist add 1 tab q afternoon on day 4, Disp: 100 tablet, Rfl: 6   glucose blood (ACCU-CHEK GUIDE TEST) test strip, Use as instructed to monitor glucose 4 times daily, Disp: 100 each, Rfl: 12   glucose blood (ACCU-CHEK GUIDE) test strip, Use as instructed to monitor glucose 4 times daily, Disp: 100 each, Rfl: 12   HYDROcodone -acetaminophen  (NORCO/VICODIN) 5-325 MG tablet, Take 1 tablet by mouth every 6 (six) hours as needed. (Patient not taking: Reported on 04/08/2024), Disp: 10 tablet, Rfl: 0   insulin  glargine (LANTUS ) 100 UNIT/ML Solostar Pen, Inject 80 Units into the skin at bedtime., Disp: 75 mL, Rfl: 1   insulin  lispro (HUMALOG   KWIKPEN) 100 UNIT/ML KwikPen, Inject 26-32 Units into the skin 3 (three) times daily., Disp: 90 mL, Rfl: 3   Insulin  Pen Needle (CAREFINE PEN NEEDLES) 31G X 6 MM MISC, Use to inject insulin  4 times daily, Disp: 100 each, Rfl: 6   ketorolac  (TORADOL ) 10 MG tablet, Take 1 tablet (10 mg total) by mouth every 8 (eight) hours as needed. (Patient not taking: Reported on 04/08/2024), Disp: 15 tablet, Rfl: 0   loratadine (CLARITIN) 10 MG tablet, Take 10 mg by mouth daily as needed for allergies., Disp: , Rfl:    methylergonovine (METHERGINE ) 0.2 MG tablet, Take 1 tablet (0.2 mg total) by mouth every 6 (six) hours. (Patient not taking: Reported on 04/08/2024), Disp: 4 tablet, Rfl: 0   ondansetron  (ZOFRAN ) 4 MG tablet, Take 1 tablet (4 mg total) by mouth every 8 (eight) hours as needed. (Patient not taking: Reported on 04/08/2024), Disp: 30 tablet, Rfl: 1   ondansetron  (ZOFRAN -ODT) 8 MG disintegrating tablet, Take 1 tablet (8 mg total) by mouth every 8 (eight) hours as needed for nausea or vomiting. (Patient not taking: Reported on 04/08/2024), Disp: 8 tablet, Rfl: 0 Social History: Reviewed -  reports that she has never smoked. She has never used smokeless tobacco.  Review of Systems:   Constitutional: Negative for fever and chills Eyes: Negative for visual disturbances Respiratory: Negative for shortness of breath, dyspnea Cardiovascular: Negative for chest pain or palpitations  Gastrointestinal: Negative for vomiting, diarrhea and constipation; no abdominal pain Genitourinary: Negative for dysuria and urgency, vaginal irritation or itching Musculoskeletal: Negative for back pain, joint pain, myalgias  Neurological: Negative for dizziness and headaches    Objective Findings:    Physical Examination: There were no vitals filed for this visit. General appearance - well appearing, and in no distress Mental status - alert, oriented to person, place, and time Chest:  Normal respiratory effort Heart -  normal rate and regular rhythm Abdomen:  Soft, nontender Pelvic: deferrec Musculoskeletal:  Normal range of motion without pain Extremities:  No edema  US  7+4 wks,single IUP with enlarged yolk sac,YS 6.9 mm,no fetal heart tones,normal ovaries,CRL 13.08 mm,gestational sac 23 mm   No results found for this or any previous visit (from the past 24 hours).    Assessment & Plan:  A:   Missed AB, measuring 7.4 weeks P:  Cytotec  ; may repeat in 48 ours if no bleeding [name] kit sent home w/pt. Will collect maternal (and possibly paternal swab at home) when she brings it back.    Return in about 2 weeks (around 05/16/2024) for follow up miscarriage.  Cathlean Cresenzo-Dishmon CNM 05/02/2024 1:26 PM

## 2024-05-02 NOTE — Patient Instructions (Signed)
What to expect after the misoprostol  Cramping, moderate to heavy bleeding, and moderate pain are normal parts of the abortion process as your uterus passes the pregnancy. Most women pass blood clots.  Cramping usually starts one to four hours after you place the misoprostol in your vagina. Bleeding usually starts between 30 minutes to four hours after you place the misoprostol in your vagina. However, it can take up to 24 hours for some women. Heavy bleeding and strong cramps usually last between one and four hours. Call or return to the hospital if you soak through more than two large maxi-pads per hour for three hours in a row.  For pain: Resting and using a heating pad or hot water bottle may help. Both Vicodin and ibuprofen usually help with the pain. You may use either one or both together. Call if your pain is not manageable with Vicodin and ibuprofen.  Vicodin: You may take one or two tablets of Vicodin every four to six hours. You may take the first pill as soon as you feel you need something for the pain. Vicodin may make you feel sleepy, nauseated, or dizzy. Do not exceed this dose.  Ibuprofen (Motrin, Advil, etc.): You may take 800mg of ibuprofen (four over-the-counter tablets) every six to eight hours. You may take the first dose of pills as soon as you feel you need something for the pain. Ibuprofen usually does not cause side effects such as sleepiness, nausea, or dizziness.  Other misoprostol side effects:  The following side effects are not dangerous and usually only last one to four hours. If any of these side effects make you very uncomfortable, you may treat your symptoms with over-the-counter medicines. Call if over-the-counter medicines do not relieve your symptoms.  Nausea or vomiting  Call if you vomit several times and cannot hold down liquids. Over the counter treatment: Benadryl 25mg to 50mg every six hours as needed, or Dramamine 25mg to 50mg every six hours as  needed.  Diarrhea  Call if you have several episodes of diarrhea lasting more than four to five hours and you are having trouble drinking liquids (you may be getting dehydrated). Over the counter treatment: Imodium 2 tablets (4mg) once.  Fever, chills  Misoprostol may cause fever or chills in the first 24 hours. After 24 hours, fever or chills may be a sign of an infection and you should call the clinic. If you already took Tylenol, Vicodin (which has 500mg of Tylenol in it), or ibuprofen for pain, do not take more of the same medication for fever or chills. Call the clinic if your fever is higher than 101 F (or 39 C). Over the counter treatment: Tylenol 500 to 650mg every four hours, Ibuprofen 800mg (four over-the-counter pills) every six to eight hours. How to tell if the abortion is complete  Most women have bleeding and painful cramping. As you pass the pregnancy, the bleeding is usually heavy and the cramping very strong. This usually lasts one to four hours. Most women pass some blood clots in the toilet and the pregnancy is often one of those clots. After the pregnancy passes, the cramps decrease and the bleeding slows down significantly.  Within a few hours after passing the pregnancy, cramps and bleeding should be much improved  

## 2024-05-02 NOTE — Progress Notes (Signed)
 US  7+4 wks,single IUP with enlarged yolk sac,YS 6.9 mm,no fetal heart tones,normal ovaries,CRL 13.08 mm,gestational sac 23 mm

## 2024-05-06 ENCOUNTER — Encounter (HOSPITAL_COMMUNITY): Payer: Self-pay | Admitting: Obstetrics and Gynecology

## 2024-05-06 ENCOUNTER — Telehealth: Payer: Self-pay | Admitting: Adult Health

## 2024-05-06 ENCOUNTER — Inpatient Hospital Stay (HOSPITAL_COMMUNITY)
Admission: AD | Admit: 2024-05-06 | Discharge: 2024-05-06 | Disposition: A | Discharge status: Home / Self Care | Attending: Obstetrics and Gynecology | Admitting: Obstetrics and Gynecology

## 2024-05-06 DIAGNOSIS — O021 Missed abortion: Secondary | ICD-10-CM | POA: Diagnosis present

## 2024-05-06 DIAGNOSIS — Z3A01 Less than 8 weeks gestation of pregnancy: Secondary | ICD-10-CM | POA: Diagnosis not present

## 2024-05-06 DIAGNOSIS — R109 Unspecified abdominal pain: Secondary | ICD-10-CM | POA: Diagnosis present

## 2024-05-06 DIAGNOSIS — R197 Diarrhea, unspecified: Secondary | ICD-10-CM | POA: Diagnosis present

## 2024-05-06 MED ORDER — ACETAMINOPHEN-CAFFEINE 500-65 MG PO TABS
2.0000 | ORAL_TABLET | Freq: Once | ORAL | Status: AC
  Administered 2024-05-06: 2 via ORAL
  Filled 2024-05-06: qty 2

## 2024-05-06 NOTE — Telephone Encounter (Addendum)
 Pt had a miscarriage. Pt was given Cytotec . Has taken 2 rounds. Took med Monday and Wednesday. Pt started bleeding very light and did have some cramps but she hasn't passed any tissue. Pt is still cramping and has has nausea. Please advise. Thanks! JSY

## 2024-05-06 NOTE — MAU Note (Signed)
 Claire Harrison is a 28 y.o. at [redacted]w[redacted]d here in [facility]U reporting: dx with missed AB on 8/4 (measuring [redacted]w[redacted]d with no cardiac activity).  Was given Cytotec  per pt choice, plan to f/u on 8/14. Took the Cytotec  Monday and Wed.  Had some bleeding and cramping, but did not pass any tissue or clots.  No bleeding since wed.  Is cramping.  Has been Tramadol and Tylenol  for the pain.   Onset of complaint: ongoing Pain score: 7 Vitals:   05/06/24 1422  BP: (!) 147/83  Pulse: 85  Resp: 17  Temp: 98.9 F (37.2 C)  SpO2: 100%      Lab orders placed from triage:

## 2024-05-06 NOTE — Progress Notes (Addendum)
 Addendum:   Received voicemail from patient @ [phone] that she was cramping bad and passing issue needed to know what to do. I get in to work at [employer].  I searched out to OR scheduler for find out whom should I tell patient to call in mean time the pre-op nurse tech , Amber , came by stated 30 minutes prior patient had called stated about cramping , passing tissue, and thinks she passed the sac they told her to call the office.  I tried calling but had to leave a message asked for a return call inquiring about how she is and if she called the office.   Patient just called back stated she spoke w/ Rosaline in [facility] stated per Dr J. Ozan cancelled procedure today and return to office in two day to check Hcg levels. Sent secure chat to OR scheduler , Benita Gains, informed what if happening with this patient and procedure to be cancelled today and by Dr Ozan.    Spoke w/ via phone for pre-op interview--- pt Lab needs dos----  A1c/  EKG  (EKG in care everywhere dated 04-25-2024 without tracing)       Lab results------ pt has CBCdiff and CMP results dated 04-25-2024 in epic Last EKG in epic dated 03-24-2019 COVID test -----patient states asymptomatic no test needed Arrive at -------  1245 on 05-09-2024 NPO after MN NO Solid Food.  Clear liquids from MN until--- 1145 Water/ diet carbonated drinks/ diet or low sugar sport drinks/ sugar free plain gelatin/ clear tea & black coffee without any type of cream or milk products may use non-sweetener  Pre-Surgery Ensure or G2: n/a  Med rec completed Medications to take morning of surgery ----- none Diabetic medication ----- do not do lantus  insulin  night before surgery See below for humalog  instructions  GLP1 agonist last dose: n/a GLP1 instructions:  Patient instructed no nail polish to be worn day of surgery Patient instructed to bring photo id and insurance card day of surgery Patient aware to have Driver (ride ) / caregiver    for 24 hours after  surgery - husband, emmalin jaquess,  pt stated he does not speak much english Patient Special Instructions ----- antibacterial soap shower morning of surgery Pre-Op special Instructions ----- pre-op orders pending due to case last add on today Pt has Dexcom LUQ.  Patient verbalized understanding of instructions that were given at this phone interview. Patient denies chest pain, sob, fever, cough at the interview.   Diabetes medication prior to surgery:     ~  Do not do your Lantus  insulin  night before surgery. ~  Humalog  insulin  do as normal day before surgery.  Day of surgery do not any of your Humalog       Insulin .  If CBG greater than 220mg /dl, may take 1/2 dose of usual correction dose. ~ Morning check your blood sugar when you wake up , if blood sugar is less than 70mg /dl you   need to treat for low blood sugar with 1/2 cup of clear juice (cranberry or apple).  After 15 minutes after treatment check your blood check again to make sure it has increased. If blood sugar is not greater than 70 mg/dl on recheck , please call Pre-op nurse at #[phone].  Pt verbalized understanding of all instructions with all questions answered.  Advised patient if other questions about her insulin  or what to do please call her endocrinologist office for recommendation, pt verbalized understanding;

## 2024-05-06 NOTE — Telephone Encounter (Signed)
 Patient called concerning the cytotec .

## 2024-05-06 NOTE — MAU Provider Note (Addendum)
 Chief Complaint: Cramping  HPI   Event Date/Time   First Provider Initiated Contact with Patient 05/06/24 1535     Claire Harrison is a 28 y.o. [id number] at [redacted]w[redacted]d who presents to maternity admissions for missed AB. Patient was seen at [facility] office 8/4 for dating ultrasound, no FHR at 7.4 weeks. She was offered cytotec  for management. Patient took first dose on Monday with no bleeding followed by second dose on Wednesday that produced only spotting for a few hours, no bleeding since then. She continues to have cramping, nausea & some diarrhea since taking the cytotec . No vomiting. Able to eat/drink normally. Patient states she would like to go ahead and have a D&C with [name] testing for baby as this is her second miscarriage this year.  Past Medical History:  Diagnosis Date   Anemia    Anxiety    Asthma    Blood transfusion without reported diagnosis    with c/s after delivery   Depression    DM (diabetes mellitus) (HCC)    type 2   GERD (gastroesophageal reflux disease)    hx in past - no current problems   Gestational diabetes    Migraine without aura and without status migrainosus, not intractable    PONV (postoperative nausea and vomiting)    Pre-eclampsia    with pregnancy - all 3   UTI (urinary tract infection)    OB History  Gravida Para Term Preterm AB Living  6 3 2 1 2 3   SAB IAB Ectopic Multiple Live Births  2   0 3    # Outcome Date GA Lbr Len/2nd Weight Sex Type Anes PTL Lv  6 Current           5 SAB 01/09/24          4 Term 12/10/21 [redacted]w[redacted]d  3941 g M CS-LVertical Spinal  LIV     Birth Comments: WDL  3 Preterm 04/03/19 [redacted]w[redacted]d  3629 g M CS-LTranv Spinal N LIV     Complications: Pre-eclampsia, Diabetes mellitus  2 Term 04/29/17 [redacted]w[redacted]d  4564 g F CS-LTranv Spinal N LIV     Complications: Pre-eclampsia, Diabetes mellitus  1 SAB 07/29/16 [redacted]w[redacted]d          Past Surgical History:  Procedure Laterality Date   CESAREAN SECTION     2018, 2020   CESAREAN SECTION N/A 12/10/2021    Procedure: CESAREAN SECTION;  Surgeon: Alger Gong, MD;  Location: [facility];  Service: Obstetrics;  Laterality: N/A;   DILATION AND CURETTAGE OF UTERUS N/A 08/01/2016   Procedure: SUCTION DILATATION AND CURETTAGE;  Surgeon: Norleen LULLA Server, MD;  Location: [facility] ORS;  Service: Gynecology;  Laterality: N/A;   DILATION AND EVACUATION N/A 01/09/2024   Procedure: DILATION AND EVACUATION, UTERUS;  Surgeon: Jayne Vonn DEL, MD;  Location: [facility] OR;  Service: Gynecology;  Laterality: N/A;  [name]   WISDOM TOOTH EXTRACTION     Family History  Problem Relation Age of Onset   Kidney disease Mother 62       died from kidney and liver failure   Diabetes Mother    Hypertension Mother    Kidney failure Mother    Miscarriages / [place] Mother    Diabetes Father    Other Father        murder   Diabetes Sister    Miscarriages / [place] Sister    Diabetes Sister    Miscarriages / [place] Sister    Scoliosis Brother  Heart murmur Daughter    Bronchitis Son    Colon cancer Maternal Aunt    Diabetes Maternal Grandmother    Kidney failure Maternal Grandmother    Social History   Tobacco Use   Smoking status: Never   Smokeless tobacco: Never  Vaping Use   Vaping status: Never Used  Substance Use Topics   Alcohol use: Not Currently   Drug use: No   Allergies  Allergen Reactions   Bee Venom Hives   Penicillins Anaphylaxis   Mango Flavoring Agent (Non-Screening) Hives and Swelling   Peanut-Containing Drug Products Hives and Swelling   Latex Rash   Medications Prior to Admission  Medication Sig Dispense Refill Last Dose/Taking   acetaminophen  (TYLENOL ) 500 MG tablet Take 1,000 mg by mouth every 6 (six) hours as needed for moderate pain (pain score 4-6).   05/05/2024 at  8:00 PM   Doxylamine -Pyridoxine  (DICLEGIS ) 10-10 MG TBEC 2 tabs q hs, if sx persist add 1 tab q am on day 3, if sx persist add 1 tab q afternoon on day 4 100 tablet 6 Past Week   insulin  glargine (LANTUS ) 100 UNIT/ML  Solostar Pen Inject 80 Units into the skin at bedtime. 75 mL 1 05/05/2024   insulin  lispro (HUMALOG  KWIKPEN) 100 UNIT/ML KwikPen Inject 26-32 Units into the skin 3 (three) times daily. 90 mL 3 05/05/2024   misoprostol  (CYTOTEC ) 200 MCG tablet Place 3 tablets (600 mcg total) vaginally as needed (Repeat in 48 hours as needed). 3 tablet 1 05/04/2024   ATROVENT HFA 17 MCG/ACT inhaler Inhale 2 puffs into the lungs 2 (two) times daily as needed for wheezing.   More than a month   Blood Glucose Monitoring Suppl (ACCU-CHEK GUIDE ME) w/Device KIT Use to check glucose 4 times daily, use as back up to CGM 1 kit 0    Continuous Glucose Sensor (DEXCOM G7 SENSOR) MISC Inject 1 Application into the skin as directed. Change sensor every 10 days as directed. 9 each 3    glucose blood (ACCU-CHEK GUIDE TEST) test strip Use as instructed to monitor glucose 4 times daily 100 each 12    glucose blood (ACCU-CHEK GUIDE) test strip Use as instructed to monitor glucose 4 times daily 100 each 12    HYDROcodone -acetaminophen  (NORCO/VICODIN) 5-325 MG tablet Take 1 tablet by mouth every 6 (six) hours as needed. (Patient not taking: No sig reported) 10 tablet 0 More than a month   Insulin  Pen Needle (CAREFINE PEN NEEDLES) 31G X 6 MM MISC Use to inject insulin  4 times daily 100 each 6    ketorolac  (TORADOL ) 10 MG tablet Take 1 tablet (10 mg total) by mouth every 8 (eight) hours as needed. (Patient not taking: Reported on 01/26/2024) 15 tablet 0    ketorolac  (TORADOL ) 10 MG tablet Take 1 tablet (10 mg total) by mouth every 6 (six) hours as needed. 20 tablet 0    loratadine (CLARITIN) 10 MG tablet Take 10 mg by mouth daily as needed for allergies.   More than a month   methylergonovine (METHERGINE ) 0.2 MG tablet Take 1 tablet (0.2 mg total) by mouth every 6 (six) hours. (Patient not taking: Reported on 04/08/2024) 4 tablet 0    ondansetron  (ZOFRAN ) 4 MG tablet Take 1 tablet (4 mg total) by mouth every 8 (eight) hours as needed. (Patient not  taking: No sig reported) 30 tablet 1 More than a month   ondansetron  (ZOFRAN -ODT) 8 MG disintegrating tablet Take 1 tablet (8 mg total) by mouth  every 8 (eight) hours as needed for nausea or vomiting. (Patient not taking: No sig reported) 8 tablet 0 More than a month   I have reviewed patient's Past Medical Hx, Surgical Hx, Family Hx, Social Hx, medications and allergies.   ROS  Pertinent items noted in HPI and remainder of comprehensive ROS otherwise negative.   PHYSICAL EXAM  Patient Vitals for the past 24 hrs:  BP Temp Temp src Pulse Resp SpO2 Height Weight  05/06/24 1507 (!) 117/59 -- -- 87 18 99 % -- --  05/06/24 1422 (!) 147/83 98.9 F (37.2 C) Oral 85 17 100 % 5' 2 (1.575 m) 119.1 kg   Constitutional: Well-developed, well-nourished female in no acute distress.  Cardiovascular: normal rate & rhythm, warm and well-perfused Respiratory: normal effort, no problems with respiration noted GI: Abd soft, non-tender, non-distended MS: Extremities nontender, no edema, normal ROM Neurologic: Alert and oriented x 4.   MDM & MAU COURSE  MDM: High  MAU Course: Orders Placed This Encounter  Procedures   Discharge patient   Meds ordered this encounter  Medications   acetaminophen -caffeine  (EXCEDRIN TENSION HEADACHE) 500-65 MG per tablet 2 tablet   Discussed with Dr Alger who agrees D&E can be scheduled. No repeat ultrasound needed as patient has had minimal bleeding. ASSESSMENT   1. Missed abortion   2. [redacted] weeks gestation of pregnancy    PLAN  Patient will return for outpatient D&E on Monday 8/11. Discharge home in stable condition with return precautions.    Elenor Mole, SWHNP 05/06/2024  Attestation of Supervision of Student:  I confirm that I have verified the information documented in the nurse practitioner student's note and that I have also personally supervised the history, physical exam and all medical decision making activities.  I have verified that all services  and findings are accurately documented in this student's note; and I agree with management and plan as outlined in the documentation. I have also made any necessary editorial changes.  Cornell JONELLE Finder, CNM [facility] for [employer], [facility] 05/06/2024 8:06 PM

## 2024-05-06 NOTE — Telephone Encounter (Signed)
 She has not passed tissue and not having any bleeding to go to MAU to be assessed. Discussed with her may have retained POC.

## 2024-05-09 ENCOUNTER — Ambulatory Visit (HOSPITAL_COMMUNITY): Admission: RE | Admit: 2024-05-09 | Source: Home / Self Care | Admitting: Obstetrics and Gynecology

## 2024-05-09 ENCOUNTER — Other Ambulatory Visit: Payer: Self-pay | Admitting: Adult Health

## 2024-05-09 ENCOUNTER — Encounter: Payer: Self-pay | Admitting: Obstetrics & Gynecology

## 2024-05-09 DIAGNOSIS — Z01818 Encounter for other preprocedural examination: Secondary | ICD-10-CM

## 2024-05-09 DIAGNOSIS — O039 Complete or unspecified spontaneous abortion without complication: Secondary | ICD-10-CM

## 2024-05-09 HISTORY — DX: Presence of spectacles and contact lenses: Z97.3

## 2024-05-09 HISTORY — DX: Type 2 diabetes mellitus with hyperglycemia: E11.65

## 2024-05-09 HISTORY — DX: Mild intermittent asthma, uncomplicated: J45.20

## 2024-05-09 HISTORY — DX: Long term (current) use of insulin: Z79.4

## 2024-05-09 HISTORY — DX: Personal history of other complications of pregnancy, childbirth and the puerperium: Z87.59

## 2024-05-09 HISTORY — DX: Personal history of gestational diabetes: Z86.32

## 2024-05-09 HISTORY — DX: Personal history of diseases of the blood and blood-forming organs and certain disorders involving the immune mechanism: Z86.2

## 2024-05-09 HISTORY — DX: Mixed hyperlipidemia: E78.2

## 2024-05-09 HISTORY — DX: Vitamin D deficiency, unspecified: E55.9

## 2024-05-09 SURGERY — DILATION AND EVACUATION, UTERUS
Anesthesia: Choice

## 2024-05-09 NOTE — Progress Notes (Signed)
 Ck QHCG

## 2024-05-13 ENCOUNTER — Ambulatory Visit: Payer: Self-pay | Admitting: Adult Health

## 2024-05-13 LAB — BETA HCG QUANT (REF LAB): hCG Quant: 221 m[IU]/mL

## 2024-05-16 ENCOUNTER — Ambulatory Visit: Admitting: Adult Health

## 2024-05-16 ENCOUNTER — Other Ambulatory Visit (HOSPITAL_COMMUNITY)
Admission: RE | Admit: 2024-05-16 | Discharge: 2024-05-16 | Disposition: A | Discharge status: Home / Self Care | Source: Ambulatory Visit | Attending: Adult Health | Admitting: Adult Health

## 2024-05-16 ENCOUNTER — Encounter: Payer: Self-pay | Admitting: Adult Health

## 2024-05-16 VITALS — BP 133/64 | HR 81 | Ht 62.0 in | Wt 259.0 lb

## 2024-05-16 DIAGNOSIS — Z124 Encounter for screening for malignant neoplasm of cervix: Secondary | ICD-10-CM | POA: Insufficient documentation

## 2024-05-16 DIAGNOSIS — F32A Depression, unspecified: Secondary | ICD-10-CM | POA: Diagnosis not present

## 2024-05-16 DIAGNOSIS — M545 Low back pain, unspecified: Secondary | ICD-10-CM

## 2024-05-16 DIAGNOSIS — F419 Anxiety disorder, unspecified: Secondary | ICD-10-CM | POA: Diagnosis not present

## 2024-05-16 DIAGNOSIS — R110 Nausea: Secondary | ICD-10-CM | POA: Diagnosis not present

## 2024-05-16 DIAGNOSIS — Z8759 Personal history of other complications of pregnancy, childbirth and the puerperium: Secondary | ICD-10-CM

## 2024-05-16 MED ORDER — ESCITALOPRAM OXALATE 10 MG PO TABS
10.0000 mg | ORAL_TABLET | Freq: Every day | ORAL | 2 refills | Status: DC
Start: 2024-05-16 — End: 2024-08-03

## 2024-05-16 MED ORDER — IBUPROFEN 600 MG PO TABS: 600.0000 mg | ORAL_TABLET | Freq: Four times a day (QID) | ORAL | 1 refills | Status: DC | PRN

## 2024-05-16 NOTE — Progress Notes (Signed)
 Subjective:     Patient ID: Claire Harrison, female   DOB: [birth year], 28 y.o.   MRN: [id number]  HPI Claire Harrison is a 28 year old Hispanic female,married, [phone] in for follow up sp miscarriage, she passed tissue 05/09/24. Her QHCG was 221 on 05/12/24. She is having some pressure and low back pain, headaches, nausea and is depressed.  And she needs a pap.  Review of Systems + pressure and low back pain, + headaches,  +nausea  +depressed.  Reviewed past medical,surgical, social and family history. Reviewed medications and allergies.     Objective:   Physical Exam BP 133/64 (BP Location: Left Arm, Patient Position: Sitting, Cuff Size: Large)   Pulse 81   Ht 5' 2 (1.575 m)   Wt 259 lb (117.5 kg)   LMP 03/06/2024 Comment: 05-02-2024 MAB  Breastfeeding No   BMI 47.37 kg/m   Skin warm and dry.Pelvic: external genitalia is normal in appearance no lesions, vagina: white discharge without odor,urethra has no lesions or masses noted, cervix:smooth, pap with HR HPV genotyping performed, uterus: normal size, shape and contour, non tender, no masses felt, adnexa: no masses or tenderness noted. Bladder is non tender and no masses felt.     Upstream - 05/16/24 1637       Pregnancy Intention Screening   Does the patient want to become pregnant in the next year? No    Does the patient's partner want to become pregnant in the next year? No      Contraception Wrap Up   Current Method Female Condom    End Method Female Condom    Contraception Counseling Provided Yes    How was the end contraceptive method provided? N/A             05/16/2024    4:38 PM 12/22/2022   10:31 AM 10/20/2022    2:40 PM  Depression screen PHQ 2/9  Decreased Interest 2 3 1   Down, Depressed, Hopeless 1 0 3  PHQ - 2 Score 3 3 4   Altered sleeping 1 3 1   Tired, decreased energy 3 3 3   Change in appetite 1 1 3   Feeling bad or failure about yourself   2 2  Trouble concentrating 0 0 1  Moving slowly or fidgety/restless 0  0 0  Suicidal thoughts 0 0 0  PHQ-9 Score 8 12 14        05/16/2024    4:39 PM 12/22/2022   10:33 AM 10/20/2022    2:42 PM 08/29/2022    8:55 AM  GAD 7 : Generalized Anxiety Score  Nervous, Anxious, on Edge 1 3 1  0  Control/stop worrying 3 2 1  0  Worry too much - different things 2 0 3 1  Trouble relaxing 1 1 3 3   Restless 0 1 3 1   Easily annoyed or irritable 2 2 3 3   Afraid - awful might happen 2 0 1 0  Total GAD 7 Score 11 9 15 8     Examination chaperoned by Claire Salt LPN  Assessment:     1. Routine Papanicolaou smear Pap sent Pap in 3 years if negative  - Cytology - PAP( [place])  2. History of miscarriage (Primary) Check QHCG and CBC this week  - CBC - Beta hCG quant (ref lab)  3. Low back pain without sciatica, unspecified back pain laterality, unspecified chronicity Try taking 600 mg motrin  and 2 ES tylenol  together and push fluids  Meds ordered this encounter  Medications   escitalopram  (  LEXAPRO ) 10 MG tablet    Sig: Take 1 tablet (10 mg total) by mouth daily.    Dispense:  30 tablet    Refill:  2    Supervising Provider:   JAYNE MINDER Harrison [[id number]]   ibuprofen  (ADVIL ) 600 MG tablet    Sig: Take 1 tablet (600 mg total) by mouth every 6 (six) hours as needed.    Dispense:  30 tablet    Refill:  1    Supervising Provider:   JAYNE, LUTHER Harrison [[id number]]     4. Nausea Has meds  5. Anxiety and depression Will rx lexapro  10 mg 1 daily Follow up in 6 weeks for ROS    Plan:    Will stop sex 2 weeks prior to IUD insertion  Return in 3 weeks for IUD insertion with Claire Harrison and talk about labs, and her miscarriages

## 2024-05-18 ENCOUNTER — Ambulatory Visit: Payer: Self-pay | Admitting: Adult Health

## 2024-05-18 LAB — CBC
Hematocrit: 41.2 % (ref 34.0–46.6)
Hemoglobin: 13.4 g/dL (ref 11.1–15.9)
MCH: 26.9 pg (ref 26.6–33.0)
MCHC: 32.5 g/dL (ref 31.5–35.7)
MCV: 83 fL (ref 79–97)
Platelets: 436 x10E3/uL (ref 150–450)
RBC: 4.98 x10E6/uL (ref 3.77–5.28)
RDW: 12.7 % (ref 11.7–15.4)
WBC: 9.3 x10E3/uL (ref 3.4–10.8)

## 2024-05-18 LAB — BETA HCG QUANT (REF LAB): hCG Quant: 17 m[IU]/mL

## 2024-05-20 LAB — CYTOLOGY - PAP
Chlamydia: NEGATIVE
Comment: NEGATIVE
Comment: NEGATIVE
Comment: NORMAL
Diagnosis: NEGATIVE
High risk HPV: NEGATIVE
Neisseria Gonorrhea: NEGATIVE

## 2024-05-31 ENCOUNTER — Encounter: Payer: Self-pay | Admitting: Adult Health

## 2024-05-31 DIAGNOSIS — O039 Complete or unspecified spontaneous abortion without complication: Secondary | ICD-10-CM | POA: Insufficient documentation

## 2024-06-02 ENCOUNTER — Ambulatory Visit: Admitting: Nutrition

## 2024-06-07 ENCOUNTER — Ambulatory Visit: Admitting: Obstetrics & Gynecology

## 2024-06-15 ENCOUNTER — Encounter: Payer: Self-pay | Admitting: Obstetrics & Gynecology

## 2024-06-15 ENCOUNTER — Ambulatory Visit: Admitting: Obstetrics & Gynecology

## 2024-06-15 VITALS — BP 105/74 | HR 73 | Ht 62.0 in | Wt 256.0 lb

## 2024-06-15 DIAGNOSIS — Z3202 Encounter for pregnancy test, result negative: Secondary | ICD-10-CM | POA: Diagnosis not present

## 2024-06-15 DIAGNOSIS — Z8759 Personal history of other complications of pregnancy, childbirth and the puerperium: Secondary | ICD-10-CM

## 2024-06-15 DIAGNOSIS — Z30430 Encounter for insertion of intrauterine contraceptive device: Secondary | ICD-10-CM

## 2024-06-15 DIAGNOSIS — Z3009 Encounter for other general counseling and advice on contraception: Secondary | ICD-10-CM

## 2024-06-15 LAB — POCT URINE PREGNANCY: Preg Test, Ur: NEGATIVE

## 2024-06-15 MED ORDER — LEVONORGESTREL 13.5 MG IU IUD: INTRAUTERINE_SYSTEM | Freq: Once | INTRAUTERINE | Status: AC

## 2024-06-15 NOTE — Progress Notes (Signed)
 GYN VISIT Patient name: Claire Harrison MRN [id number]  Date of birth: Nov 13, 1995 Chief Complaint:   Follow-up and IUD insertion  History of Present Illness:   Claire Harrison is a 28 y.o. [phone] female being seen today for the following concerns:  Contraceptive management:  While she may consider a pregnancy in the future, her partner does not desire a pregnancy.  Pt agreeable to contraception at this time.  Due to recent miscarriage, last period was slightly irregular -- just completed her menses this week- notes light spotting currently.  Prior to miscarriage, menses were typically regular each month - typically 3-4 days.    Additionally, pt wishes to review the results of her recent and [name] testing completed in August.  She states that she had spoken to a genetic counselor who had mentioned that she should consider parental genetic testing and she was unsure and wanted to get more information.   Patient's last menstrual period was 06/12/2024.    Review of Systems:   Pertinent items are noted in HPI Denies fever/chills, dizziness, headaches, visual disturbances, fatigue, shortness of breath, chest pain, abdominal pain, vomiting Pertinent History Reviewed:   Past Surgical History:  Procedure Laterality Date   CESAREAN SECTION  04/29/2024   @ [facility] in [place])   CESAREAN SECTION N/A 12/10/2021   Procedure: CESAREAN SECTION;  Surgeon: Alger Gong, MD;  Location: [facility] LD ORS;  Service: Obstetrics;  Laterality: N/A;   CESAREAN SECTION  04/03/2019   @ [facility], [place]   DILATION AND CURETTAGE OF UTERUS N/A 08/01/2016   Procedure: SUCTION DILATATION AND CURETTAGE;  Surgeon: Norleen LULLA Server, MD;  Location: [facility] ORS;  Service: Gynecology;  Laterality: N/A;   DILATION AND EVACUATION N/A 01/09/2024   Procedure: DILATION AND EVACUATION, UTERUS;  Surgeon: Jayne Vonn DEL, MD;  Location: [facility] OR;  Service: Gynecology;  Laterality: N/A;  [name]   WISDOM TOOTH EXTRACTION       Past Medical History:  Diagnosis Date   Anxiety    Depression    GERD (gastroesophageal reflux disease)    History of anemia    History of gestational diabetes    History of pre-eclampsia    with pregnancy - all 3   Hyperlipidemia, mixed    Migraine without aura and without status migrainosus, not intractable    neruologist-- dr j. rush   Mild intermittent asthma without complication    PONV (postoperative nausea and vomiting)    Type 2 diabetes mellitus with hyperglycemia, with long-term current use of insulin  [facility])    endocrinologist--- Benton Rio NP  ([facility] in [place]) ;  (05-06-2024 pt stated check blood sugar multiple times w/ Dexcom,  fasting average 70-100)   Vitamin D  deficiency    Wears glasses    Reviewed problem list, medications and allergies. Physical Assessment:   Vitals:   06/15/24 1200  BP: 105/74  Pulse: 73  Weight: 256 lb (116.1 kg)  Height: 5' 2 (1.575 m)  Body mass index is 46.82 kg/m.       Physical Examination:   General appearance: alert, well appearing, and in no distress  Psych: mood appropriate, normal affect  Skin: warm & dry   Cardiovascular: normal heart rate noted  Respiratory: normal respiratory effort, no distress  Abdomen: soft, non-tender   Pelvic: VULVA: normal appearing vulva with no masses, tenderness or lesions, VAGINA: normal appearing vagina with normal color and discharge, no lesions, CERVIX: normal appearing cervix without discharge or lesions  Extremities: no edema  IUD INSERTION   The risks and benefits of the method and placement have been thouroughly reviewed with the patient and all questions were answered.  Specifically the patient is aware of failure rate of 09/998, expulsion of the IUD and of possible perforation.  The patient is aware of irregular bleeding due to the method and understands the incidence of irregular bleeding diminishes with time.  Signed copy of informed consent in chart.    A sterile  speculum was placed in the vagina.  The cervix was visualized, prepped using Betadine , and grasped with a single tooth tenaculum. The uterus was sounded to 9 cm.  Skyla   IUD placed per manufacturer's recommendations. The strings were trimmed to approximately 3 cm. The patient tolerated the procedure well.    Chaperone: Rutherford Rover    Assessment & Plan:  1) contraceptive management, Skyla  IUD insertion - Reviewed options and discussed different types of IUDs.  Discussed risk benefit of each option.  After much discussion and questions were addressed, patient desires to proceed with Skyla  - See above for procedure, IUD placed without difficulty The patient was given post procedure instructions, including signs and symptoms of infection and to check for the strings after each menses or each month, and refraining from intercourse or anything in the vagina for 3 days. She was given a care card with date IUD placed, and date IUD to be removed. She is scheduled for a f/u appointment in  6- 8 weeks.  2) history of miscarriage - Discussed genetic results and recommendations regarding parental micro array analysis and karyotype screening - Discussed that overall [name] testing helps provide an answer as to why the miscarriage may have happened - That based on the mosaic results as well as her prior normal pregnancies including normal [name] testing with her last miscarriage, it is likely that this was a spontaneous occurrence.  Since we cannot guarantee spontaneous occurrence, the recommendation is for parental genetic testing - Unfortunately I cannot give her percentages in terms of the likelihood of the genetic abnormality occurring again, patient only given percentages of chromosomal risk based on age.  Reviewed both the risk now and in 3 years from now with removal of IUD (ACOG practice bulletin [phone]) - Additionally since she does not desire pregnancy currently, would recommend postponing any testing  until she was ready to pursue a pregnancy if she desired at that time - Questions and concerns were addressed   Orders Placed This Encounter  Procedures   POCT urine pregnancy    Return in about 6 weeks (around 07/27/2024) for 6-8wk IUD check up.   Trejuan Matherne, DO Attending Obstetrician & Gynecologist, [facility] for [employer], [facility]

## 2024-06-22 ENCOUNTER — Telehealth: Payer: Self-pay

## 2024-06-22 ENCOUNTER — Other Ambulatory Visit (HOSPITAL_COMMUNITY): Payer: Self-pay

## 2024-06-22 NOTE — Telephone Encounter (Signed)
 Pharmacy Patient Advocate Encounter   Received notification from CoverMyMeds that prior authorization for Dexcom G7 sensor is required/requested.   Insurance verification completed.   The patient is insured through [employer] .   Per test claim: The current 30 day co-pay is, $0.  No PA needed at this time. This test claim was processed through [facility]- copay amounts may vary at other pharmacies due to pharmacy/plan contracts, or as the patient moves through the different stages of their insurance plan.     Current PA expires 07/19/24

## 2024-06-28 ENCOUNTER — Ambulatory Visit: Admitting: Adult Health

## 2024-07-13 ENCOUNTER — Ambulatory Visit: Admitting: Nurse Practitioner

## 2024-07-18 ENCOUNTER — Encounter: Admitting: Nutrition

## 2024-07-27 ENCOUNTER — Ambulatory Visit: Admitting: Obstetrics & Gynecology

## 2024-08-01 ENCOUNTER — Telehealth: Payer: Self-pay

## 2024-08-01 ENCOUNTER — Ambulatory Visit: Admitting: Obstetrics & Gynecology

## 2024-08-01 ENCOUNTER — Other Ambulatory Visit: Payer: Self-pay | Admitting: Adult Health

## 2024-08-01 NOTE — Telephone Encounter (Signed)
 Pharmacy Patient Advocate Encounter   Received notification from CoverMyMeds that prior authorization for Dexcom G7 sensor is required/requested.   Insurance verification completed.   The patient is insured through [employer].   Per test claim: PA required; PA submitted to above mentioned insurance via Latent Key/confirmation #/EOC [id number] Status is pending

## 2024-08-03 ENCOUNTER — Encounter: Payer: Self-pay | Admitting: Adult Health

## 2024-08-03 ENCOUNTER — Ambulatory Visit: Admitting: Adult Health

## 2024-08-03 VITALS — BP 126/81 | HR 89 | Ht 62.0 in | Wt 263.5 lb

## 2024-08-03 DIAGNOSIS — Z30431 Encounter for routine checking of intrauterine contraceptive device: Secondary | ICD-10-CM

## 2024-08-03 DIAGNOSIS — N930 Postcoital and contact bleeding: Secondary | ICD-10-CM | POA: Diagnosis not present

## 2024-08-03 DIAGNOSIS — Z975 Presence of (intrauterine) contraceptive device: Secondary | ICD-10-CM | POA: Insufficient documentation

## 2024-08-03 DIAGNOSIS — R140 Abdominal distension (gaseous): Secondary | ICD-10-CM | POA: Diagnosis not present

## 2024-08-03 NOTE — Progress Notes (Signed)
  Subjective:     Patient ID: Claire Harrison, female   DOB: [birth year], 28 y.o.   MRN: [id number]  HPI Claire Harrison is a 28 year old Hispanic female, married, Claire Harrison, in for IUD check, she had Skyla  placed 06/15/24. She has noticed bloating, more headaches and spotting after sex. He says he can feel the strings. She is having hard time losing weight too, she says. She had a recent miscarriage.  She has appt with endocrinologist tomorrow.     Component Value Date/Time   DIAGPAP  05/16/2024 1653    - Negative for intraepithelial lesion or malignancy (NILM)   HPVHIGH Negative 05/16/2024 1653   ADEQPAP  05/16/2024 1653    Satisfactory for evaluation; transformation zone component PRESENT.    PCP is [facility] Review of Systems +bloating +more headaches +spotting after sex Has had some breast tenderness too +hard time losing weight. Reviewed past medical,surgical, social and family history. Reviewed medications and allergies.     Objective:   Physical Exam BP 126/81 (BP Location: Left Arm, Patient Position: Sitting, Cuff Size: Normal)   Pulse 89   Ht 5' 2 (1.575 m)   Wt 263 lb 8 oz (119.5 kg)   BMI 48.19 kg/m     Skin warm and dry.Pelvic: external genitalia is normal in appearance no lesions, vagina: scant pink discharge without odor,urethra has no lesions or masses noted, cervix:smooth, +IUD strings at os, strings shortened, uterus: normal size, shape and contour, non tender, no masses felt, adnexa: no masses or tenderness noted. Bladder is non tender and no masses felt.  Fall risk is low  Upstream - 08/03/24 1409       Pregnancy Intention Screening   Does the patient want to become pregnant in the next year? No    Does the patient's partner want to become pregnant in the next year? No    Would the patient like to discuss contraceptive options today? No      Contraception Wrap Up   Current Method IUD or IUS    End Method IUD or IUS    Contraception Counseling Provided  Yes         Examination chaperoned by Clarita Salt LPN  Assessment:     1. IUD check up (Primary) +strings at os   2. Postcoital bleeding Try position without as much penetration to see if that helps  3. IUD (intrauterine device) in place Skyla  was placed 06/15/24 by Dr Ozan   4. Bloating Feels bloated and having hard time losing weight Take with endocrinologist about weight, since you are on insulin     Plan:     Follow up prn

## 2024-08-04 ENCOUNTER — Ambulatory Visit: Admitting: Nurse Practitioner

## 2024-08-04 DIAGNOSIS — Z794 Long term (current) use of insulin: Secondary | ICD-10-CM

## 2024-08-04 DIAGNOSIS — E782 Mixed hyperlipidemia: Secondary | ICD-10-CM

## 2024-08-08 NOTE — Telephone Encounter (Signed)
 Pharmacy Patient Advocate Encounter  Received notification from [employer] that Prior Authorization for Dexcom G7 sensor  has been DENIED.  Full denial letter will be uploaded to the media tab. See denial reason below.  We may be able to approve this devic when we see certain records (records that you have had a face-to-fae meeting with the ordering practitioner no more than 3 months prior to submission of the reauthorization request to see how well this device is working for you [facility] the efficacy of the continuous glucose monitoring system]).   PA #/Case ID/Reference #: [id number]

## 2024-08-11 ENCOUNTER — Encounter: Admitting: Nutrition

## 2024-08-15 NOTE — Telephone Encounter (Signed)
 Spoke to pt, I advised her she must keep her OV in order to get her supplies per insurance. She is scheduled for wednesday

## 2024-08-15 NOTE — Telephone Encounter (Signed)
 Noted

## 2024-08-17 ENCOUNTER — Ambulatory Visit: Admitting: Nurse Practitioner

## 2024-08-17 ENCOUNTER — Encounter: Payer: Self-pay | Admitting: Nurse Practitioner

## 2024-08-17 VITALS — BP 128/78 | HR 82 | Ht 62.0 in | Wt 257.0 lb

## 2024-08-17 DIAGNOSIS — Z794 Long term (current) use of insulin: Secondary | ICD-10-CM | POA: Diagnosis not present

## 2024-08-17 DIAGNOSIS — E782 Mixed hyperlipidemia: Secondary | ICD-10-CM | POA: Diagnosis not present

## 2024-08-17 DIAGNOSIS — E66813 Obesity, class 3: Secondary | ICD-10-CM

## 2024-08-17 DIAGNOSIS — Z6841 Body Mass Index (BMI) 40.0 and over, adult: Secondary | ICD-10-CM

## 2024-08-17 DIAGNOSIS — E1165 Type 2 diabetes mellitus with hyperglycemia: Secondary | ICD-10-CM

## 2024-08-17 LAB — POCT GLYCOSYLATED HEMOGLOBIN (HGB A1C): Hemoglobin A1C: 10.6 % — AB (ref 4.0–5.6)

## 2024-08-17 MED ORDER — ACCU-CHEK GUIDE TEST VI STRP: ORAL_STRIP | 3 refills | Status: AC

## 2024-08-17 MED ORDER — ACCU-CHEK SOFTCLIX LANCETS MISC: 6 refills | Status: AC

## 2024-08-17 MED ORDER — DEXCOM G7 SENSOR MISC: 1.0000 | 3 refills | Status: DC

## 2024-08-17 MED ORDER — CAREFINE PEN NEEDLES 31G X 6 MM MISC: 6 refills | Status: AC

## 2024-08-17 MED ORDER — INSULIN LISPRO (1 UNIT DIAL) 100 UNIT/ML (KWIKPEN): 14.0000 [IU] | PEN_INJECTOR | Freq: Three times a day (TID) | SUBCUTANEOUS | 3 refills | Status: AC

## 2024-08-17 MED ORDER — INSULIN GLARGINE 100 UNIT/ML SOLOSTAR PEN: 60.0000 [IU] | PEN_INJECTOR | Freq: Every day | SUBCUTANEOUS | 3 refills | Status: AC

## 2024-08-17 NOTE — Progress Notes (Signed)
 Endocrinology Follow Up Note       08/17/2024, 11:29 AM   Subjective:    Patient ID: Claire Harrison, female    DOB: [birth year].  Claire Harrison is being seen in follow up after being seen in consultation for management of currently uncontrolled symptomatic diabetes requested by  Gerome Tillman CROME, FNP.  She was previously seen in this clinic by Dr. Lenis in 2020 but disappeared from care.   Past Medical History:  Diagnosis Date   Anxiety    Depression    GERD (gastroesophageal reflux disease)    History of anemia    History of gestational diabetes    History of pre-eclampsia    with pregnancy - all 3   Hyperlipidemia, mixed    Migraine without aura and without status migrainosus, not intractable    neruologist-- dr j. rush   Mild intermittent asthma without complication    PONV (postoperative nausea and vomiting)    Type 2 diabetes mellitus with hyperglycemia, with long-term current use of insulin  [facility])    endocrinologist--- Benton Rio NP  ([facility] in [place]) ;  (05-06-2024 pt stated check blood sugar multiple times w/ Dexcom,  fasting average 70-100)   Vitamin D  deficiency    Wears glasses     Past Surgical History:  Procedure Laterality Date   CESAREAN SECTION  04/29/2024   @ [facility] in [place])   CESAREAN SECTION N/A 12/10/2021   Procedure: CESAREAN SECTION;  Surgeon: Alger Gong, MD;  Location: [facility] LD ORS;  Service: Obstetrics;  Laterality: N/A;   CESAREAN SECTION  04/03/2019   @ [facility], [place]   DILATION AND CURETTAGE OF UTERUS N/A 08/01/2016   Procedure: SUCTION DILATATION AND CURETTAGE;  Surgeon: Norleen LULLA Server, MD;  Location: [facility] ORS;  Service: Gynecology;  Laterality: N/A;   DILATION AND EVACUATION N/A 01/09/2024   Procedure: DILATION AND EVACUATION, UTERUS;  Surgeon: Jayne Vonn DEL, MD;  Location: [facility] OR;  Service: Gynecology;  Laterality: N/A;  [name]   WISDOM TOOTH  EXTRACTION      Social History   Socioeconomic History   Marital status: Married    Spouse name: Magdiel   Number of children: 3   Years of education: Not on file   Highest education level: Some college, no degree  Occupational History   Not on file  Tobacco Use   Smoking status: Never   Smokeless tobacco: Never  Vaping Use   Vaping status: Never Used  Substance and Sexual Activity   Alcohol use: Not Currently   Drug use: Never   Sexual activity: Yes    Birth control/protection: I.U.D.  Other Topics Concern   Not on file  Social History Narrative   Lives with family   L handed   Caffeine : 2-3 drinks a week    Social Drivers of Corporate Investment Banker Strain: Low Risk (04/25/2024)   Received from [facility]   Overall Financial Resource Strain (CARDIA)    How hard is it for you to pay for the very basics like food, housing, medical care, and heating?: Not very hard  Food Insecurity: No Food Insecurity (04/25/2024)   Received from [facility]  Hunger Vital Sign    Within the past 12 months, you worried that your food would run out before you got the money to buy more.: Never true    Within the past 12 months, the food you bought just didn't last and you didn't have money to get more.: Never true  Transportation Needs: No Transportation Needs (04/25/2024)   Received from [facility] - Transportation    Lack of Transportation (Medical): No    Lack of Transportation (Non-Medical): No  Physical Activity: Sufficiently Active (04/25/2024)   Received from [facility]   Exercise Vital Sign    On average, how many days per week do you engage in moderate to strenuous exercise (like a brisk walk)?: 7 days    On average, how many minutes do you engage in exercise at this level?: 30 min  Stress: No Stress Concern Present (04/25/2024)   Received from [facility] - Occupational Stress Questionnaire    Do you  feel stress - tense, restless, nervous, or anxious, or unable to sleep at night because your mind is troubled all the time - these days?: Only a little  Social Connections: Moderately Integrated (04/25/2024)   Received from [facility]   Social Connection and Isolation Panel    In a typical week, how many times do you talk on the phone with family, friends, or neighbors?: More than three times a week    How often do you get together with friends or relatives?: Three times a week    How often do you attend church or religious services?: More than 4 times per year    Do you belong to any clubs or organizations such as church groups, unions, fraternal or athletic groups, or school groups?: No    How often do you attend meetings of the clubs or organizations you belong to?: Never    Are you married, widowed, divorced, separated, never married, or living with a partner?: Married    Family History  Problem Relation Age of Onset   Diabetes Maternal Grandmother    Kidney failure Maternal Grandmother    Diabetes Father    Other Father        murder   Kidney disease Mother 57       died from kidney and liver failure   Diabetes Mother    Hypertension Mother    Kidney failure Mother    Miscarriages / Stillbirths Mother    Scoliosis Brother    Diabetes Sister    Miscarriages / Stillbirths Sister    Diabetes Sister    Miscarriages / Stillbirths Sister    Heart murmur Daughter    Bronchitis Son    Colon cancer Maternal Aunt     Outpatient Encounter Medications as of 08/17/2024  Medication Sig   Accu-Chek Softclix Lancets lancets Use as instructed to monitor glucose 4 times daily   acetaminophen  (TYLENOL ) 500 MG tablet Take 1,000 mg by mouth every 6 (six) hours as needed for moderate pain (pain score 4-6).   ATROVENT HFA 17 MCG/ACT inhaler Inhale 2 puffs into the lungs 2 (two) times daily as needed for wheezing.   glucose blood (ACCU-CHEK GUIDE TEST) test strip Use as instructed to monitor  glucose 4 times daily   ibuprofen  (ADVIL ) 600 MG tablet TAKE 1 TABLET (600mg  total) BY MOUTH EVERY 6 HOURS AS NEEDED   Levonorgestrel  (SKYLA ) 13.5 MG IUD by Intrauterine route.  loratadine (CLARITIN) 10 MG tablet Take 10 mg by mouth daily as needed for allergies.   Multiple Vitamin (MULTIVITAMIN) tablet Take 1 tablet by mouth daily.   [DISCONTINUED] Continuous Glucose Sensor (DEXCOM G7 SENSOR) MISC Inject 1 Application into the skin as directed. Change sensor every 10 days as directed.   [DISCONTINUED] insulin  glargine (LANTUS ) 100 UNIT/ML Solostar Pen Inject 80 Units into the skin at bedtime.   [DISCONTINUED] insulin  lispro (HUMALOG  KWIKPEN) 100 UNIT/ML KwikPen Inject 26-32 Units into the skin 3 (three) times daily.   [DISCONTINUED] Insulin  Pen Needle (CAREFINE PEN NEEDLES) 31G X 6 MM MISC Use to inject insulin  4 times daily   Continuous Glucose Sensor (DEXCOM G7 SENSOR) MISC Inject 1 Application into the skin as directed. Change sensor every 10 days as directed.   insulin  glargine (LANTUS ) 100 UNIT/ML Solostar Pen Inject 60 Units into the skin at bedtime.   insulin  lispro (HUMALOG  KWIKPEN) 100 UNIT/ML KwikPen Inject 14-20 Units into the skin 3 (three) times daily.   Insulin  Pen Needle (CAREFINE PEN NEEDLES) 31G X 6 MM MISC Use to inject insulin  4 times daily   No facility-administered encounter medications on file as of 08/17/2024.    ALLERGIES: Allergies  Allergen Reactions   Bee Venom Hives and Swelling   Cashew Nut (Anacardium Occidentale) Skin Test Hives and Swelling   Penicillins Anaphylaxis   Mango Flavoring Agent (Non-Screening) Hives and Swelling    Eating and even toughing causes skin burn   Latex Rash    VACCINATION STATUS: Immunization History  Administered Date(s) Administered   Influenza,inj,Quad PF,6+ Mos 10/13/2016, 07/03/2020, 07/30/2021   Moderna Sars-Covid-2 Vaccination 03/05/2020, 04/02/2020, 11/19/2020   Tdap 10/01/2021    Diabetes She presents for her  follow-up diabetic visit. She has type 2 diabetes mellitus. Onset time: diagnosed at age 49-gestational first. Her disease course has been fluctuating. There are no hypoglycemic associated symptoms. Associated symptoms include blurred vision, fatigue, foot paresthesias and visual change (seeing floaters). Pertinent negatives for diabetes include no polydipsia, no polyuria and no weight loss. There are no hypoglycemic complications. Required assistance: EMS was called for low event in the 40s. Symptoms are stable. Diabetic complications include peripheral neuropathy. Risk factors for coronary artery disease include diabetes mellitus, obesity, sedentary lifestyle and stress. Current diabetic treatment includes intensive insulin  program. She is compliant with treatment most of the time. Her weight is decreasing steadily. She is following a generally unhealthy diet. When asked about meal planning, she reported none. She has not had a previous visit with a dietitian. She participates in exercise intermittently. Her home blood glucose trend is fluctuating dramatically. (She presents today, with no meter or logs to review.  She has not had her CGM as she needed follow up here first.  She misplaced her meter at home.  She notes she has not taken her insulin  in the last 2 weeks since she did not have a way to check glucose.  She says she has been eating more veggies recently in efforts to lose weight and is seeing some improvement.  She also started drinking Celsius energy drinks as it was marketed towards helping to burn fat.  Her POCT A1c today is 10.6%, increasing from last A1c of 7.9%.  She is wanting to know about Omnipod and wants to revisit the idea of bariatric weight loss surgery.) An ACE inhibitor/angiotensin II receptor blocker is not being taken. She does not see a podiatrist.Eye exam is not current.    Review of systems  Constitutional: + decreasing  body weight,  current Body mass index is 47.01 kg/m. , no  fatigue, no subjective hyperthermia, no subjective hypothermia Eyes: no blurry vision, no xerophthalmia ENT: no sore throat, no nodules palpated in throat, no dysphagia/odynophagia, no hoarseness Cardiovascular: no chest pain, no shortness of breath, no palpitations, no leg swelling Respiratory: no cough, no shortness of breath Gastrointestinal: no nausea/vomiting/diarrhea Musculoskeletal: no muscle/joint aches Skin: no rashes, no hyperemia Neurological: no tremors, no numbness, no tingling, no dizziness Psychiatric: no depression, no anxiety  Objective:     BP 128/78 (BP Location: Left Arm, Patient Position: Sitting, Cuff Size: Large)   Pulse 82   Ht 5' 2 (1.575 m)   Wt 257 lb (116.6 kg)   BMI 47.01 kg/m   Wt Readings from Last 3 Encounters:  08/17/24 257 lb (116.6 kg)  08/03/24 263 lb 8 oz (119.5 kg)  06/15/24 256 lb (116.1 kg)     BP Readings from Last 3 Encounters:  08/17/24 128/78  08/03/24 126/81  06/15/24 105/74      Physical Exam- Limited  Constitutional:  Body mass index is 47.01 kg/m. , not in acute distress, normal state of mind Eyes:  EOMI, no exophthalmos Musculoskeletal: no gross deformities, strength intact in all four extremities, no gross restriction of joint movements Skin:  no rashes, no hyperemia Neurological: no tremor with outstretched hands   Diabetic Foot Exam - Simple   Simple Foot Form Diabetic Foot exam was performed with the following findings: Yes 08/17/2024 11:14 AM  Visual Inspection No deformities, no ulcerations, no other skin breakdown bilaterally: Yes Sensation Testing Intact to touch and monofilament testing bilaterally: Yes Pulse Check Posterior Tibialis and Dorsalis pulse intact bilaterally: Yes Comments     CMP ( most recent) CMP     Component Value Date/Time   NA 135 01/09/2024 0734   NA 137 11/26/2021 1126   K 3.8 01/09/2024 0734   CL 102 01/09/2024 0734   CO2 19 (L) 01/09/2024 0734   GLUCOSE 99 01/09/2024 0734    BUN 12 01/09/2024 0734   BUN 9 11/26/2021 1126   CREATININE 0.54 01/09/2024 0734   CALCIUM 9.3 01/09/2024 0734   PROT 6.0 (L) 12/10/2021 1616   PROT 6.2 11/26/2021 1126   ALBUMIN 2.3 (L) 12/10/2021 1616   ALBUMIN 3.4 (L) 11/26/2021 1126   AST 18 12/10/2021 1616   ALT 15 12/10/2021 1616   ALKPHOS 115 12/10/2021 1616   BILITOT <0.1 (L) 12/10/2021 1616   BILITOT <0.2 11/26/2021 1126   GFRNONAA >60 01/09/2024 0734     Diabetic Labs (most recent): Lab Results  Component Value Date   HGBA1C 10.6 (A) 08/17/2024   HGBA1C 7.9 (A) 12/29/2023   HGBA1C 8.0 (A) 09/29/2023   MICROALBUR 30 mg/L 12/29/2023   MICROALBUR 150 mg/L 01/15/2023     Lipid Panel ( most recent) Lipid Panel  No results found for: CHOL, TRIG, HDL, CHOLHDL, VLDL, LDLCALC, LDLDIRECT, LABVLDL    Lab Results  Component Value Date   TSH 2.130 07/01/2021           Assessment & Plan:   1) Type 2 diabetes mellitus with hyperglycemia, with long-term current use of insulin  (HCC)  She presents today, with no meter or logs to review.  She has not had her CGM as she needed follow up here first.  She misplaced her meter at home.  She notes she has not taken her insulin  in the last 2 weeks since she did not have a way to check glucose.  She says she has been eating more veggies recently in efforts to lose weight and is seeing some improvement.  She also started drinking Celsius energy drinks as it was marketed towards helping to burn fat.  Her POCT A1c today is 10.6%, increasing from last A1c of 7.9%.  She is wanting to know about Omnipod and wants to revisit the idea of bariatric weight loss surgery.  - Claire Harrison has currently uncontrolled symptomatic type 2 DM since 28 years of age.   -Recent labs reviewed.   - I had a long discussion with her about the progressive nature of diabetes and the pathology behind its complications. -her diabetes is not currently complicated but she remains at a high  risk for more acute and chronic complications which include CAD, CVA, CKD, retinopathy, and neuropathy. These are all discussed in detail with her.  The following Lifestyle Medicine recommendations according to American College of Lifestyle Medicine [facility]) were discussed and offered to patient and she agrees to start the journey:  A. Whole Foods, Plant-based plate comprising of fruits and vegetables, plant-based proteins, whole-grain carbohydrates was discussed in detail with the patient.   A list for source of those nutrients were also provided to the patient.  Patient will use only water or unsweetened tea for hydration. B.  The need to stay away from risky substances including alcohol, smoking; obtaining 7 to 9 hours of restorative sleep, at least 150 minutes of moderate intensity exercise weekly, the importance of healthy social connections,  and stress reduction techniques were discussed. C.  A full color page of  Calorie density of various food groups per pound showing examples of each food groups was provided to the patient.  - Nutritional counseling repeated/built upon at each appointment.  - The patient admits there is a room for improvement in their diet and drink choices. -  Suggestion is made for the patient to avoid simple carbohydrates from their diet including Cakes, Sweet Desserts / Pastries, Ice Cream, Soda (diet and regular), Sweet Tea, Candies, Chips, Cookies, Sweet Pastries, Store Bought Juices, Alcohol in Excess of 1-2 drinks a day, Artificial Sweeteners, Coffee Creamer, and Sugar-free Products. This will help patient to have stable blood glucose profile and potentially avoid unintended weight gain.   - I encouraged the patient to switch to unprocessed or minimally processed complex starch and increased protein intake (animal or plant source), fruits, and vegetables.   - Patient is advised to stick to a routine mealtimes to eat 3 meals a day and avoid unnecessary snacks (to snack  only to correct hypoglycemia).  - I have approached her with the following individualized plan to manage her diabetes and patient agrees:   -She is advised to restart Lantus  but at 60 units SQ nightly and restart her Humalog  but at 14-20 units TID with meals if glucose is above 60 and she is eating (Specific instructions on how to titrate insulin  dosage based on glucose readings given to patient in writing).     -We did talk about possibility of using Omnipod 5 in the future, however since she is considering bariatric weight loss surgery, we decided to hold on this for now as it will likely reduce her need for insulin  in the future.  I did re-initiate referral to bariatric weight loss surgery center.  -she is encouraged to restart monitoring glucose 4 times daily (using her CGM), before meals and before bed, and to call the clinic if she has readings less than 60 or above  200 for 3 tests in a row.  She has benefited from this devices as she is on MDI and prone to dramatic fluctuations in glucose.  I did send in refill on strips and lancets to her pharmacy as well.  - she is warned not to take insulin  without proper monitoring per orders. - Adjustment parameters are given to her for hypo and hyperglycemia in writing.  - she will not be good candidate for GLP1 therapy as originally thought as she found out her aunt previously had thyroid cancer, making this too high risk for her.  She asked about Trulicity today which is in this same class.    -She did not go through with the bariatric weight loss surgery, as she wanted to get better control of her glucose first.  - Specific targets for  A1c; LDL, HDL, and Triglycerides were discussed with the patient.  2) Blood Pressure /Hypertension:  her blood pressure is controlled to target without the use of antihypertensive medications.  3) Lipids/Hyperlipidemia:    Her recent lipid panel from 12/31/22 shows controlled LDL of 83 and slightly elevated  triglycerides of 186.  She is not currently on any lipid lowering medications.  She is advised to adopt low fat diet and increase exercise when able.  Will recheck lipid panel prior to next visit.  4)  Weight/Diet:  her Body mass index is 47.01 kg/m.  -  clearly complicating her diabetes care.   she is a candidate for weight loss. I discussed with her the fact that loss of 5 - 10% of her  current body weight will have the most impact on her diabetes management.  Exercise, and detailed carbohydrates information provided  -  detailed on discharge instructions.  She asked about other weight loss medications today including GLP1 product (which we have not used due to positive thyroid cancer family history).  I do not consider her a good candidate for Phentermine either due to frequent headaches.  She asked about referral for bariatric surgery and I did initiate that for her today. She is also scheduled with our dietician here.  5) Vitamin D  deficiency Her recent vitamin D  level on 12/31/22 was low at 10.8.  She is not currently on any supplementation.  Will recheck vitamin D  level prior to next visit.  6) Chronic Care/Health Maintenance: -she is not on ACEI/ARB or Statin medications and is encouraged to initiate and continue to follow up with Ophthalmology, Dentist, Podiatrist at least yearly or according to recommendations, and advised to stay away from smoking. I have recommended yearly flu vaccine and pneumonia vaccine at least every 5 years; moderate intensity exercise for up to 150 minutes weekly; and sleep for at least 7 hours a day.   - she is advised to maintain close follow up with Gerome Tillman CROME, FNP for primary care needs, as well as her other providers for optimal and coordinated care.       I spent  51  minutes in the care of the patient today including review of labs from CMP, Lipids, Thyroid Function, Hematology (current and previous including abstractions from other facilities);  face-to-face time discussing  her blood glucose readings/logs, discussing hypoglycemia and hyperglycemia episodes and symptoms, medications doses, her options of short and long term treatment based on the latest standards of care / guidelines;  discussion about incorporating lifestyle medicine;  and documenting the encounter. Risk reduction counseling performed per USPSTF guidelines to reduce obesity and cardiovascular risk factors.  Please refer to Patient Instructions for Blood Glucose Monitoring and Insulin /Medications Dosing Guide  in media tab for additional information. Please  also refer to  Patient Self Inventory in the Media  tab for reviewed elements of pertinent patient history.  Claire Harrison participated in the discussions, expressed understanding, and voiced agreement with the above plans.  All questions were answered to her satisfaction. she is encouraged to contact clinic should she have any questions or concerns prior to her return visit.     Follow up plan: - Return in about 4 months (around 12/15/2024) for Diabetes F/U with A1c in office, No previsit labs, Bring meter and logs.   Benton Rio, [facility] [address] [place], [place] [address] Phone: [phone] Fax: [phone]  08/17/2024, 11:29 AM

## 2024-08-22 ENCOUNTER — Telehealth: Payer: Self-pay

## 2024-08-22 NOTE — Telephone Encounter (Signed)
 Pharmacy Patient Advocate Encounter   Received notification from Pt Calls Messages that prior authorization for Dexcom G7 sensor is required/requested.   Insurance verification completed.   The patient is insured through [employer].   Per test claim: PA required; PA submitted to above mentioned insurance via Latent Key/confirmation #/EOC [id number] Status is pending

## 2024-08-22 NOTE — Telephone Encounter (Signed)
 Pt had a visit, checking status of PA for sensor

## 2024-08-30 ENCOUNTER — Encounter: Admitting: Nutrition

## 2024-09-01 ENCOUNTER — Ambulatory Visit: Admitting: Adult Health

## 2024-09-01 NOTE — Telephone Encounter (Signed)
 Patient is asking about her dexcom. Please Advise.   Can she have a sample?

## 2024-09-01 NOTE — Telephone Encounter (Signed)
 Pharmacy Patient Advocate Encounter  Received notification from [employer] that Prior Authorization for Dexcom G7 sensor has been DENIED.  Full denial letter will be uploaded to the media tab. See denial reason below.

## 2024-09-01 NOTE — Telephone Encounter (Signed)
 Status says pending on her PA for this, will loop in PA team to see if there has been update.  Brittany, yes she can have another sample.

## 2024-09-07 ENCOUNTER — Ambulatory Visit: Admitting: Adult Health

## 2024-09-28 ENCOUNTER — Ambulatory Visit: Admitting: Nurse Practitioner

## 2024-10-03 ENCOUNTER — Encounter: Admitting: Nutrition

## 2024-10-04 ENCOUNTER — Ambulatory Visit: Admitting: Adult Health

## 2024-10-10 ENCOUNTER — Encounter: Payer: Self-pay | Admitting: Adult Health

## 2024-10-10 ENCOUNTER — Ambulatory Visit: Admitting: Adult Health

## 2024-10-10 VITALS — BP 137/90 | HR 90 | Ht 62.0 in | Wt 254.0 lb

## 2024-10-10 DIAGNOSIS — Z30431 Encounter for routine checking of intrauterine contraceptive device: Secondary | ICD-10-CM | POA: Diagnosis not present

## 2024-10-10 DIAGNOSIS — R030 Elevated blood-pressure reading, without diagnosis of hypertension: Secondary | ICD-10-CM | POA: Diagnosis not present

## 2024-10-10 DIAGNOSIS — R519 Headache, unspecified: Secondary | ICD-10-CM | POA: Diagnosis not present

## 2024-10-10 DIAGNOSIS — R110 Nausea: Secondary | ICD-10-CM

## 2024-10-10 DIAGNOSIS — R109 Unspecified abdominal pain: Secondary | ICD-10-CM | POA: Insufficient documentation

## 2024-10-10 DIAGNOSIS — Z3202 Encounter for pregnancy test, result negative: Secondary | ICD-10-CM | POA: Diagnosis not present

## 2024-10-10 LAB — POCT URINE PREGNANCY: Preg Test, Ur: NEGATIVE

## 2024-10-10 MED ORDER — ONDANSETRON HCL 4 MG PO TABS: 4.0000 mg | ORAL_TABLET | Freq: Three times a day (TID) | ORAL | 1 refills | Status: AC | PRN

## 2024-10-10 NOTE — Progress Notes (Signed)
" °  Subjective:     Patient ID: Claire Harrison, female   DOB: 1996-06-27, 29 y.o.   MRN: [id number]  HPI Claire Harrison is a 29 year old female,married, [phone] in for IUD check, feels sharp stabbing sensation for her and he feels it too, has nausea and headaches too,has also had BTB. She has Skyla  IUD.     Component Value Date/Time   DIAGPAP  05/16/2024 1653    - Negative for intraepithelial lesion or malignancy (NILM)   HPVHIGH Negative 05/16/2024 1653   ADEQPAP  05/16/2024 1653    Satisfactory for evaluation; transformation zone component PRESENT.   PCP is at [facility]  Review of Systems feels sharp stabbing sensation with IUD for her and he feels it too,  + nausea and headaches too, +had BTB.   +cramping  Reviewed past medical,surgical, social and family history. Reviewed medications and allergies.  Objective:   Physical Exam BP (!) 137/90 (BP Location: Left Arm, Patient Position: Sitting, Cuff Size: Normal)   Pulse 90   Ht 5' 2 (1.575 m)   Wt 254 lb (115.2 kg)   LMP 09/24/2024 (Exact Date)   BMI 46.46 kg/m  UPT is negative Skin warm and dry.Pelvic: external genitalia is normal in appearance no lesions, vagina: pink,urethra has no lesions or masses noted, cervix:smooth and bulbous, +IUD strings at os , uterus: normal size, shape and contour, non tender, no masses felt, adnexa: no masses or tenderness noted. Bladder is non tender and no masses felt. Fall risk is low  Upstream - 10/10/24 1213       Pregnancy Intention Screening   Does the patient want to become pregnant in the next year? Yes    Does the patient's partner want to become pregnant in the next year? Yes    Would the patient like to discuss contraceptive options today? Yes      Contraception Wrap Up   Current Method IUD or IUS    End Method IUD or IUS    Contraception Counseling Provided Yes            Examination chaperoned by Clarita Salt LPN  Assessment:     1. IUD check up (Primary) +strings at  os, had skyla  placed 06/15/24 Will get pelvic US  at [facility] 10/14/24 at 12:30 pm to assess uterus and ovaries and IUD placement   - US  PELVIC COMPLETE WITH TRANSVAGINAL; Future  2. Nausea +nausea  Will rx zofran   Meds ordered this encounter  Medications   ondansetron  (ZOFRAN ) 4 MG tablet    Sig: Take 1 tablet (4 mg total) by mouth every 8 (eight) hours as needed.    Dispense:  30 tablet    Refill:  1    Supervising Provider:   JAYNE, LUTHER H [[id number]]     3. Nonintractable headache, unspecified chronicity pattern, unspecified headache type Has headaches   4. Abdominal cramping +cramping  Will get pelvic US  at [facility] 10/14/24 at 12:30 pm to assess uterus and ovaries and IUD placement   - US  PELVIC COMPLETE WITH TRANSVAGINAL; Future  5. Elevated BP without diagnosis of hypertension Keep check on BP   6. Negative pregnancy test - POCT urine pregnancy     Plan:     Follow up prn     "

## 2024-10-14 ENCOUNTER — Ambulatory Visit (HOSPITAL_COMMUNITY): Admission: RE | Admit: 2024-10-14 | Source: Ambulatory Visit

## 2024-10-20 ENCOUNTER — Ambulatory Visit (HOSPITAL_COMMUNITY)
Admission: RE | Admit: 2024-10-20 | Discharge: 2024-10-20 | Disposition: A | Discharge status: Home / Self Care | Source: Ambulatory Visit | Attending: Adult Health | Admitting: Adult Health

## 2024-10-20 DIAGNOSIS — R109 Unspecified abdominal pain: Secondary | ICD-10-CM | POA: Diagnosis present

## 2024-10-20 DIAGNOSIS — Z30431 Encounter for routine checking of intrauterine contraceptive device: Secondary | ICD-10-CM | POA: Diagnosis present

## 2024-10-21 ENCOUNTER — Ambulatory Visit: Payer: Self-pay | Admitting: Adult Health

## 2024-11-01 ENCOUNTER — Other Ambulatory Visit (HOSPITAL_COMMUNITY)
Admission: RE | Admit: 2024-11-01 | Discharge: 2024-11-01 | Disposition: A | Source: Ambulatory Visit | Attending: Obstetrics & Gynecology | Admitting: Obstetrics & Gynecology

## 2024-11-01 ENCOUNTER — Ambulatory Visit

## 2024-11-01 DIAGNOSIS — R300 Dysuria: Secondary | ICD-10-CM

## 2024-11-01 DIAGNOSIS — N898 Other specified noninflammatory disorders of vagina: Secondary | ICD-10-CM

## 2024-11-01 LAB — POCT URINALYSIS DIPSTICK OB
Blood, UA: NEGATIVE
Ketones, UA: NEGATIVE
Leukocytes, UA: NEGATIVE
Nitrite, UA: NEGATIVE

## 2024-11-01 NOTE — Addendum Note (Signed)
 Addended by: Heath Tesler I on: 11/01/2024 11:47 AM   Modules accepted: Orders

## 2024-11-02 ENCOUNTER — Ambulatory Visit: Payer: Self-pay | Admitting: Adult Health

## 2024-11-02 LAB — MICROSCOPIC EXAMINATION
Casts: NONE SEEN /LPF
Epithelial Cells (non renal): >10 /HPF — AB (ref 0–10)
RBC, Urine: NONE SEEN /HPF (ref 0–2)

## 2024-11-02 LAB — CERVICOVAGINAL ANCILLARY ONLY
Bacterial Vaginitis (gardnerella): POSITIVE — AB
Candida Glabrata: NEGATIVE
Candida Vaginitis: POSITIVE — AB
Comment: NEGATIVE
Comment: NEGATIVE
Comment: NEGATIVE

## 2024-11-02 LAB — URINALYSIS, ROUTINE W REFLEX MICROSCOPIC
Bilirubin, UA: NEGATIVE
Glucose, UA: NEGATIVE
Ketones, UA: NEGATIVE
Leukocytes,UA: NEGATIVE
Nitrite, UA: NEGATIVE
RBC, UA: NEGATIVE
Urobilinogen, Ur: 0.2 mg/dL (ref 0.2–1.0)
pH, UA: 5 (ref 5.0–7.5)

## 2024-11-02 MED ORDER — METRONIDAZOLE 500 MG PO TABS: 500.0000 mg | ORAL_TABLET | Freq: Two times a day (BID) | ORAL | 0 refills | Status: AC

## 2024-11-02 MED ORDER — FLUCONAZOLE 150 MG PO TABS: ORAL_TABLET | ORAL | 1 refills | Status: AC

## 2024-11-03 LAB — URINE CULTURE

## 2024-11-07 ENCOUNTER — Ambulatory Visit: Admitting: Adult Health

## 2024-12-20 ENCOUNTER — Ambulatory Visit: Admitting: Nurse Practitioner
